# Patient Record
Sex: Female | Born: 1950 | Race: White | Hispanic: No | Marital: Single | Smoking: Never smoker
Health system: Southern US, Community
[De-identification: ages and names within clinical notes are randomized; demographics above are authoritative.]

## PROBLEM LIST (undated history)

## (undated) DIAGNOSIS — G912 (Idiopathic) normal pressure hydrocephalus: Secondary | ICD-10-CM

## (undated) DIAGNOSIS — E785 Hyperlipidemia, unspecified: Secondary | ICD-10-CM

## (undated) DIAGNOSIS — K5790 Diverticulosis of intestine, part unspecified, without perforation or abscess without bleeding: Secondary | ICD-10-CM

## (undated) DIAGNOSIS — C679 Malignant neoplasm of bladder, unspecified: Secondary | ICD-10-CM

## (undated) DIAGNOSIS — I69398 Other sequelae of cerebral infarction: Secondary | ICD-10-CM

## (undated) DIAGNOSIS — Z8739 Personal history of other diseases of the musculoskeletal system and connective tissue: Secondary | ICD-10-CM

## (undated) DIAGNOSIS — R209 Unspecified disturbances of skin sensation: Secondary | ICD-10-CM

## (undated) DIAGNOSIS — K649 Unspecified hemorrhoids: Secondary | ICD-10-CM

## (undated) DIAGNOSIS — I639 Cerebral infarction, unspecified: Secondary | ICD-10-CM

## (undated) DIAGNOSIS — S82892A Other fracture of left lower leg, initial encounter for closed fracture: Secondary | ICD-10-CM

## (undated) HISTORY — DX: Personal history of other diseases of the musculoskeletal system and connective tissue: Z87.39

## (undated) HISTORY — DX: (Idiopathic) normal pressure hydrocephalus: G91.2

## (undated) HISTORY — PX: APPENDECTOMY: SHX54

## (undated) HISTORY — PX: ABDOMINAL HYSTERECTOMY: SHX81

## (undated) HISTORY — PX: TONSILLECTOMY: SUR1361

## (undated) HISTORY — DX: Other sequelae of cerebral infarction: I69.398

## (undated) HISTORY — DX: Unspecified disturbances of skin sensation: R20.9

## (undated) HISTORY — PX: OTHER SURGICAL HISTORY: SHX169

## (undated) HISTORY — DX: Other fracture of left lower leg, initial encounter for closed fracture: S82.892A

## (undated) HISTORY — DX: Hyperlipidemia, unspecified: E78.5

## (undated) HISTORY — PX: REFRACTIVE SURGERY: SHX103

## (undated) HISTORY — PX: BRAIN SURGERY: SHX531

## (undated) HISTORY — PX: BLADDER SURGERY: SHX569

---

## 2009-04-14 DIAGNOSIS — C679 Malignant neoplasm of bladder, unspecified: Secondary | ICD-10-CM

## 2009-04-14 HISTORY — DX: Malignant neoplasm of bladder, unspecified: C67.9

## 2012-04-21 ENCOUNTER — Emergency Department (HOSPITAL_COMMUNITY)
Admission: EM | Admit: 2012-04-21 | Discharge: 2012-04-21 | Disposition: A | Discharge status: Home / Self Care | Payer: BC Managed Care – PPO | Attending: Emergency Medicine | Admitting: Emergency Medicine

## 2012-04-21 ENCOUNTER — Encounter (HOSPITAL_COMMUNITY): Payer: Self-pay | Admitting: Emergency Medicine

## 2012-04-21 DIAGNOSIS — Z8551 Personal history of malignant neoplasm of bladder: Secondary | ICD-10-CM | POA: Insufficient documentation

## 2012-04-21 DIAGNOSIS — Z90710 Acquired absence of both cervix and uterus: Secondary | ICD-10-CM | POA: Insufficient documentation

## 2012-04-21 DIAGNOSIS — R55 Syncope and collapse: Secondary | ICD-10-CM

## 2012-04-21 HISTORY — DX: Malignant neoplasm of bladder, unspecified: C67.9

## 2012-04-21 LAB — URINALYSIS, ROUTINE W REFLEX MICROSCOPIC
Bilirubin Urine: NEGATIVE
Glucose, UA: NEGATIVE mg/dL
Ketones, ur: NEGATIVE mg/dL
Specific Gravity, Urine: >1.03 — ABNORMAL HIGH (ref 1.005–1.030)
pH: 6 (ref 5.0–8.0)

## 2012-04-21 LAB — COMPREHENSIVE METABOLIC PANEL
ALT: 24 U/L (ref 0–35)
AST: 24 U/L (ref 0–37)
Albumin: 3.7 g/dL (ref 3.5–5.2)
Alkaline Phosphatase: 79 U/L (ref 39–117)
Calcium: 8.8 mg/dL (ref 8.4–10.5)
GFR calc Af Amer: 82 mL/min — ABNORMAL LOW (ref >90)
Glucose, Bld: 141 mg/dL — ABNORMAL HIGH (ref 70–99)
Potassium: 4 mEq/L (ref 3.5–5.1)
Sodium: 136 mEq/L (ref 135–145)
Total Protein: 6.2 g/dL (ref 6.0–8.3)

## 2012-04-21 LAB — POCT I-STAT TROPONIN I

## 2012-04-21 LAB — URINE MICROSCOPIC-ADD ON

## 2012-04-21 LAB — CBC WITH DIFFERENTIAL/PLATELET
Basophils Absolute: 0 10*3/uL (ref 0.0–0.1)
Eosinophils Absolute: 0 10*3/uL (ref 0.0–0.7)
Lymphs Abs: 1.5 10*3/uL (ref 0.7–4.0)
MCH: 29 pg (ref 26.0–34.0)
Neutrophils Relative %: 73 % (ref 43–77)
Platelets: 224 10*3/uL (ref 150–400)
RBC: 4.52 MIL/uL (ref 3.87–5.11)
RDW: 13.8 % (ref 11.5–15.5)
WBC: 7.8 10*3/uL (ref 4.0–10.5)

## 2012-04-21 NOTE — ED Notes (Signed)
Pt c/o witnessed loss of consciousness. Was at gym, got hot sat down and was led to floor. Witnesses state she was out for 3-5 min. Denies head trauma or pain.

## 2012-04-21 NOTE — ED Provider Notes (Signed)
History     CSN: [id number]  Arrival date & time 04/21/12  1101   First MD Initiated Contact with Patient 04/21/12 1113      No chief complaint on file.   (Consider location/radiation/quality/duration/timing/severity/associated sxs/prior treatment) HPI  Kylie Calhoun is a 62 y.o. female with past medical history significant for bladder cancer in full remission for the last 2 years complaining of syncope this morning while having a personal training session. Patient said that she began to feel very warm and weak. That was witnessed, no head trauma, jerking movements, loss of bowel or bladder control. Patient was unconscious for lasted for seconds and she woke with full mental faculties with no postictal state. Prior to and after the event patient denies  Chest pain, palpitations, shortness of breath, abdominal pain, nausea vomiting, change in bowel or bladder habits. Patient had similar prior syncopal episode over the summer when she was on her front lawn and overheated.  Past Medical History  Diagnosis Date  . Bladder cancer 2011    Past Surgical History  Procedure Date  . Other surgical history     BSG  . Tonsillectomy   . Abdominal hysterectomy   . Appendectomy   . Bladder surgery     scope and removal of tumors    History reviewed. No pertinent family history.  History  Substance Use Topics  . Smoking status: Not on file  . Smokeless tobacco: Not on file  . Alcohol Use: Not on file    OB History    No data available      Review of Systems  Constitutional: Negative for fever.  Respiratory: Negative for shortness of breath.   Cardiovascular: Negative for chest pain.  Gastrointestinal: Negative for nausea, vomiting, abdominal pain and diarrhea.  Neurological: Positive for syncope.  All other systems reviewed and are negative.    Allergies  Review of patient's allergies indicates not on file.  Home Medications  No current outpatient prescriptions on  file.  BP 88/60  Pulse 74  Temp 97.4 F (36.3 C) (Oral)  Resp 15  SpO2 99%  Physical Exam  Nursing note and vitals reviewed. Constitutional: She is oriented to person, place, and time. She appears well-developed and well-nourished. No distress.  HENT:  Head: Normocephalic.  Mouth/Throat: Oropharynx is clear and moist.       Ruddy palpebral conjunctiva  Eyes: Conjunctivae normal and EOM are normal. Pupils are equal, round, and reactive to light.  Neck: Normal range of motion.  Cardiovascular: Normal rate and intact distal pulses.   Pulmonary/Chest: Effort normal and breath sounds normal. No stridor. No respiratory distress. She has no wheezes. She has no rales. She exhibits no tenderness.  Abdominal: Soft. Bowel sounds are normal. She exhibits no distension and no mass. There is no tenderness. There is no rebound and no guarding.  Musculoskeletal: Normal range of motion.  Neurological: She is alert and oriented to person, place, and time.       Strength is 5 out of 5x4 extremities.  Patient ambulates without issue.  Skin: Skin is warm.  Psychiatric: She has a normal mood and affect.    ED Course  Procedures (including critical care time)  Labs Reviewed  COMPREHENSIVE METABOLIC PANEL - Abnormal; Notable for the following:    Glucose, Bld 141 (*)     GFR calc non Af Amer 70 (*)     GFR calc Af Amer 82 (*)     All other components within normal limits  URINALYSIS, ROUTINE W REFLEX MICROSCOPIC - Abnormal; Notable for the following:    APPearance CLOUDY (*)     Specific Gravity, Urine >1.030 (*)     Hgb urine dipstick SMALL (*)     All other components within normal limits  URINE MICROSCOPIC-ADD ON - Abnormal; Notable for the following:    Crystals CA OXALATE CRYSTALS (*)     All other components within normal limits  CBC WITH DIFFERENTIAL  POCT I-STAT TROPONIN I  POCT CBG (FASTING - GLUCOSE)-MANUAL ENTRY   No results found.   Date: 04/21/2012  Rate: 67  Rhythm:  normal sinus rhythm  QRS Axis: normal  Intervals: normal  ST/T Wave abnormalities: normal  Conduction Disutrbances:none  Narrative Interpretation:   Old EKG Reviewed: none available    1. Syncope       MDM  Patient with likely overexertion and heat exhaustion in the gym. She is borderline orthostatic by numbers however she is asymptomatic when she stands up. Patient and was without any difficulty she is asymptomatic for chest pain palpitations, shortness of breath at this time.  EKG, blood work, urinalysis are within normal limits.  This is a shared visit with attending who agrees with plan and stability to d/c to home.    Pt verbalized understanding and agrees with care plan. Outpatient follow-up and return precautions given.              Wynetta Emery, PA-C 04/21/12 1244

## 2012-04-21 NOTE — ED Provider Notes (Signed)
Medical screening examination/treatment/procedure(s) were conducted as a shared visit with non-physician practitioner(s) and myself.  I personally evaluated the patient during the encounter  Patient was evaluated by myself and is doing well. Her examination is entirely normal and she is back at her normal baseline. Patient likely got overheated while working out at the gym and had a vasovagal episode. She can followup with her primary doctor.  Gilda Crease, MD 04/21/12 [phone]

## 2012-06-21 ENCOUNTER — Other Ambulatory Visit (HOSPITAL_COMMUNITY): Payer: Self-pay | Admitting: Internal Medicine

## 2012-06-24 ENCOUNTER — Ambulatory Visit (HOSPITAL_COMMUNITY)
Admission: RE | Admit: 2012-06-24 | Discharge: 2012-06-24 | Disposition: A | Discharge status: Home / Self Care | Payer: BC Managed Care – PPO | Source: Ambulatory Visit | Attending: Internal Medicine | Admitting: Internal Medicine

## 2012-06-24 ENCOUNTER — Encounter (HOSPITAL_COMMUNITY): Payer: Self-pay

## 2012-06-24 DIAGNOSIS — M810 Age-related osteoporosis without current pathological fracture: Secondary | ICD-10-CM | POA: Insufficient documentation

## 2012-06-24 MED ORDER — ZOLEDRONIC ACID 5 MG/100ML IV SOLN
5.0000 mg | Freq: Once | INTRAVENOUS | Status: AC
  Administered 2012-06-24: 5 mg via INTRAVENOUS
  Filled 2012-06-24: qty 100

## 2012-06-24 MED ORDER — SODIUM CHLORIDE 0.9 % IV SOLN
INTRAVENOUS | Status: DC
  Administered 2012-06-24: 10:00:00 via INTRAVENOUS

## 2014-02-13 ENCOUNTER — Encounter (HOSPITAL_COMMUNITY): Payer: Self-pay

## 2014-03-29 ENCOUNTER — Encounter: Payer: Self-pay | Admitting: Gastroenterology

## 2014-05-09 ENCOUNTER — Ambulatory Visit: Payer: Medicare Other

## 2014-05-09 VITALS — Ht 63.0 in | Wt 132.8 lb

## 2014-05-09 DIAGNOSIS — Z1211 Encounter for screening for malignant neoplasm of colon: Secondary | ICD-10-CM

## 2014-05-09 MED ORDER — MOVIPREP 100 G PO SOLR: ORAL | Status: DC

## 2014-05-09 NOTE — Progress Notes (Signed)
Per pt, no allergies to soy or egg products.Pt not taking any weight loss meds or using  O2 at home.   Pt states she had a colonoscopy done in [place] over 15 years ago, but does not remember the doctor's name. She states the colonoscopy was normal.

## 2014-05-19 ENCOUNTER — Encounter: Payer: Self-pay | Admitting: Gastroenterology

## 2014-05-19 ENCOUNTER — Ambulatory Visit (AMBULATORY_SURGERY_CENTER): Payer: Medicare Other | Admitting: Gastroenterology

## 2014-05-19 VITALS — BP 126/83 | HR 58 | Temp 97.4°F | Resp 12 | Ht 63.0 in | Wt 132.0 lb

## 2014-05-19 DIAGNOSIS — Z1211 Encounter for screening for malignant neoplasm of colon: Secondary | ICD-10-CM

## 2014-05-19 MED ORDER — SODIUM CHLORIDE 0.9 % IV SOLN: 500.0000 mL | INTRAVENOUS | Status: DC

## 2014-05-19 NOTE — Patient Instructions (Signed)
YOU HAD AN ENDOSCOPIC PROCEDURE TODAY AT THE [facility]: Refer to the procedure report that was given to you for any specific questions about what was found during the examination.  If the procedure report does not answer your questions, please call your gastroenterologist to clarify.  If you requested that your care partner not be given the details of your procedure findings, then the procedure report has been included in a sealed envelope for you to review at your convenience later.  YOU SHOULD EXPECT: Some feelings of bloating in the abdomen. Passage of more gas than usual.  Walking can help get rid of the air that was put into your GI tract during the procedure and reduce the bloating. If you had a lower endoscopy (such as a colonoscopy or flexible sigmoidoscopy) you may notice spotting of blood in your stool or on the toilet paper. If you underwent a bowel prep for your procedure, then you may not have a normal bowel movement for a few days.  DIET: Your first meal following the procedure should be a light meal and then it is ok to progress to your normal diet.  A half-sandwich or bowl of soup is an example of a good first meal.  Heavy or fried foods are harder to digest and may make you feel nauseous or bloated.  Likewise meals heavy in dairy and vegetables can cause extra gas to form and this can also increase the bloating.  Drink plenty of fluids but you should avoid alcoholic beverages for 24 hours.  ACTIVITY: Your care partner should take you home directly after the procedure.  You should plan to take it easy, moving slowly for the rest of the day.  You can resume normal activity the day after the procedure however you should NOT DRIVE or use heavy machinery for 24 hours (because of the sedation medicines used during the test).    SYMPTOMS TO REPORT IMMEDIATELY: A gastroenterologist can be reached at any hour.  During normal business hours, 8:30 AM to 5:00 PM Monday through Friday,  call [phone].  After hours and on weekends, please call the GI answering service at [phone] who will take a message and have the physician on call contact you.   Following lower endoscopy (colonoscopy or flexible sigmoidoscopy):  Excessive amounts of blood in the stool  Significant tenderness or worsening of abdominal pains  Swelling of the abdomen that is new, acute  Fever of 100F or higher FOLLOW UP: If any biopsies were taken you will be contacted by phone or by letter within the next 1-3 weeks.  Call your gastroenterologist if you have not heard about the biopsies in 3 weeks.  Our staff will call the home number listed on your records the next business day following your procedure to check on you and address any questions or concerns that you may have at that time regarding the information given to you following your procedure. This is a courtesy call and so if there is no answer at the home number and we have not heard from you through the emergency physician on call, we will assume that you have returned to your regular daily activities without incident.  SIGNATURES/CONFIDENTIALITY: You and/or your care partner have signed paperwork which will be entered into your electronic medical record.  These signatures attest to the fact that that the information above on your After Visit Summary has been reviewed and is understood.  Full responsibility of the confidentiality of this discharge   information lies with you and/or your care-partner.     Handouts were given to your care partner on hemorrhoids, diverticulosis,and a high fiber diet with liberal fluid intake. You might notice some irritation in your nose or drainage.  This may cause feelings of congestion.  This is from the oxygen, which can be drying.  This is no cause for concern; this should clear up in a few days.  You may resume your current medications today. Please call if any questions or concerns.   

## 2014-05-19 NOTE — Progress Notes (Signed)
No problems noted in the recovery room. maw 

## 2014-05-19 NOTE — Progress Notes (Signed)
Stable to RR 

## 2014-05-19 NOTE — Op Note (Signed)
[facility]  [address] [place], [id number]   COLONOSCOPY PROCEDURE REPORT  PATIENT: Kylie Calhoun, Kylie Calhoun  MR#: [id number] BIRTHDATE: 07-30-1950 , 63  yrs. old GENDER: female ENDOSCOPIST: Ladene Artist, MD, [facility] REFERRED BY:W.  Lutricia Feil, M.D. PROCEDURE DATE:  05/19/2014 PROCEDURE:   Colonoscopy, screening First Screening Colonoscopy - Avg.  risk and is 50 yrs.  old or older - No.  Prior Negative Screening - Now for repeat screening. 10 or more years since last screening  History of Adenoma - Now for follow-up colonoscopy & has been > or = to 3 yrs.  N/A  Polyps Removed Today? No.  Polyps Removed Today? No.  Recommend repeat exam, <10 yrs? Polyps Removed Today? No.  Recommend repeat exam, <10 yrs? No. ASA CLASS:   Class II INDICATIONS:average risk for colorectal cancer. MEDICATIONS: Monitored anesthesia care, Propofol 250 mg IV, and lidocaine 40 mg IV DESCRIPTION OF PROCEDURE:   After the risks benefits and alternatives of the procedure were thoroughly explained, informed consent was obtained.  The digital rectal exam revealed no abnormalities of the rectum.   The LB [id number]  endoscope was introduced through the anus and advanced to the cecum, which was identified by both the appendix and ileocecal valve. No adverse events experienced.   The quality of the prep was excellent, using MoviPrep  The instrument was then slowly withdrawn as the colon was fully examined.    COLON FINDINGS: There was mild diverticulosis noted in the sigmoid colon.   The examination was otherwise normal.  Retroflexed views revealed internal Grade I hemorrhoids. The time to cecum=2 minutes 29 seconds.  Withdrawal time=10 minutes 24 seconds.  The scope was withdrawn and the procedure completed. COMPLICATIONS: There were no immediate complications.  ENDOSCOPIC IMPRESSION: 1.   Mild diverticulosis in the sigmoid colon 2.   Grade l internal hemorrhoids  RECOMMENDATIONS: 1.   High fiber diet with liberal fluid intake. 2.  Continue current colorectal screening recommendations for "routine risk" patients with a repeat colonoscopy in 10 years.  eSigned:  Ladene Artist, MD, [facility] 05/19/2014 9:56 AM

## 2014-05-22 ENCOUNTER — Telehealth: Payer: Self-pay | Admitting: *Deleted

## 2014-05-22 NOTE — Telephone Encounter (Signed)
Message left

## 2015-03-19 ENCOUNTER — Encounter (HOSPITAL_COMMUNITY): Payer: Self-pay | Admitting: Emergency Medicine

## 2015-03-19 ENCOUNTER — Observation Stay (HOSPITAL_COMMUNITY): Payer: Medicare Other

## 2015-03-19 ENCOUNTER — Emergency Department (HOSPITAL_COMMUNITY): Payer: Medicare Other

## 2015-03-19 ENCOUNTER — Inpatient Hospital Stay (HOSPITAL_COMMUNITY)
Admission: EM | Admit: 2015-03-19 | Discharge: 2015-03-21 | DRG: 066 | Disposition: A | Discharge status: Home / Self Care | Payer: Medicare Other | Attending: Internal Medicine | Admitting: Internal Medicine

## 2015-03-19 DIAGNOSIS — R269 Unspecified abnormalities of gait and mobility: Secondary | ICD-10-CM | POA: Diagnosis present

## 2015-03-19 DIAGNOSIS — I639 Cerebral infarction, unspecified: Secondary | ICD-10-CM

## 2015-03-19 DIAGNOSIS — R200 Anesthesia of skin: Secondary | ICD-10-CM | POA: Diagnosis present

## 2015-03-19 DIAGNOSIS — G459 Transient cerebral ischemic attack, unspecified: Secondary | ICD-10-CM | POA: Diagnosis present

## 2015-03-19 DIAGNOSIS — R2681 Unsteadiness on feet: Secondary | ICD-10-CM | POA: Diagnosis not present

## 2015-03-19 DIAGNOSIS — R262 Difficulty in walking, not elsewhere classified: Secondary | ICD-10-CM | POA: Diagnosis present

## 2015-03-19 DIAGNOSIS — I6523 Occlusion and stenosis of bilateral carotid arteries: Secondary | ICD-10-CM | POA: Diagnosis present

## 2015-03-19 DIAGNOSIS — Z8551 Personal history of malignant neoplasm of bladder: Secondary | ICD-10-CM

## 2015-03-19 DIAGNOSIS — Z8739 Personal history of other diseases of the musculoskeletal system and connective tissue: Secondary | ICD-10-CM

## 2015-03-19 DIAGNOSIS — G450 Vertebro-basilar artery syndrome: Secondary | ICD-10-CM | POA: Diagnosis not present

## 2015-03-19 DIAGNOSIS — I739 Peripheral vascular disease, unspecified: Secondary | ICD-10-CM | POA: Diagnosis present

## 2015-03-19 DIAGNOSIS — K649 Unspecified hemorrhoids: Secondary | ICD-10-CM | POA: Insufficient documentation

## 2015-03-19 DIAGNOSIS — M8580 Other specified disorders of bone density and structure, unspecified site: Secondary | ICD-10-CM | POA: Diagnosis present

## 2015-03-19 DIAGNOSIS — K5790 Diverticulosis of intestine, part unspecified, without perforation or abscess without bleeding: Secondary | ICD-10-CM | POA: Insufficient documentation

## 2015-03-19 DIAGNOSIS — E785 Hyperlipidemia, unspecified: Secondary | ICD-10-CM | POA: Insufficient documentation

## 2015-03-19 DIAGNOSIS — C679 Malignant neoplasm of bladder, unspecified: Secondary | ICD-10-CM | POA: Diagnosis present

## 2015-03-19 DIAGNOSIS — R29700 NIHSS score 0: Secondary | ICD-10-CM | POA: Diagnosis present

## 2015-03-19 HISTORY — DX: Diverticulosis of intestine, part unspecified, without perforation or abscess without bleeding: K57.90

## 2015-03-19 HISTORY — DX: Unspecified hemorrhoids: K64.9

## 2015-03-19 LAB — COMPREHENSIVE METABOLIC PANEL
ALBUMIN: 4.1 g/dL (ref 3.5–5.0)
ALK PHOS: 61 U/L (ref 38–126)
ALT: 21 U/L (ref 14–54)
ANION GAP: 10 (ref 5–15)
AST: 24 U/L (ref 15–41)
BUN: 13 mg/dL (ref 6–20)
CO2: 22 mmol/L (ref 22–32)
CREATININE: 1.03 mg/dL — AB (ref 0.44–1.00)
Calcium: 9 mg/dL (ref 8.9–10.3)
Chloride: 108 mmol/L (ref 101–111)
GFR calc non Af Amer: 55 mL/min — ABNORMAL LOW (ref >60)
Glucose, Bld: 111 mg/dL — ABNORMAL HIGH (ref 65–99)
POTASSIUM: 3.7 mmol/L (ref 3.5–5.1)
SODIUM: 140 mmol/L (ref 135–145)
TOTAL PROTEIN: 6.4 g/dL — AB (ref 6.5–8.1)
Total Bilirubin: 1.3 mg/dL — ABNORMAL HIGH (ref 0.3–1.2)

## 2015-03-19 LAB — CBC
HCT: 44.8 % (ref 36.0–46.0)
HEMOGLOBIN: 14.6 g/dL (ref 12.0–15.0)
MCH: 29.3 pg (ref 26.0–34.0)
MCHC: 32.6 g/dL (ref 30.0–36.0)
MCV: 90 fL (ref 78.0–100.0)
PLATELETS: 252 10*3/uL (ref 150–400)
RBC: 4.98 MIL/uL (ref 3.87–5.11)
RDW: 13.6 % (ref 11.5–15.5)
WBC: 7.1 10*3/uL (ref 4.0–10.5)

## 2015-03-19 LAB — PROTIME-INR
INR: 1 (ref 0.00–1.49)
Prothrombin Time: 13.4 seconds (ref 11.6–15.2)

## 2015-03-19 LAB — ETHANOL: Alcohol, Ethyl (B): <5 mg/dL (ref <5)

## 2015-03-19 LAB — I-STAT CHEM 8, ED
BUN: 13 mg/dL (ref 6–20)
CREATININE: 1 mg/dL (ref 0.44–1.00)
Calcium, Ion: 1.09 mmol/L — ABNORMAL LOW (ref 1.13–1.30)
Chloride: 107 mmol/L (ref 101–111)
Glucose, Bld: 106 mg/dL — ABNORMAL HIGH (ref 65–99)
HEMATOCRIT: 48 % — AB (ref 36.0–46.0)
HEMOGLOBIN: 16.3 g/dL — AB (ref 12.0–15.0)
Potassium: 3.6 mmol/L (ref 3.5–5.1)
SODIUM: 143 mmol/L (ref 135–145)
TCO2: 24 mmol/L (ref 0–100)

## 2015-03-19 LAB — URINALYSIS, ROUTINE W REFLEX MICROSCOPIC
Bilirubin Urine: NEGATIVE
Glucose, UA: NEGATIVE mg/dL
KETONES UR: NEGATIVE mg/dL
Nitrite: NEGATIVE
PROTEIN: NEGATIVE mg/dL
Specific Gravity, Urine: 1.006 (ref 1.005–1.030)
pH: 6.5 (ref 5.0–8.0)

## 2015-03-19 LAB — RAPID URINE DRUG SCREEN, HOSP PERFORMED
Amphetamines: NOT DETECTED
BARBITURATES: NOT DETECTED
BENZODIAZEPINES: NOT DETECTED
Cocaine: NOT DETECTED
Opiates: NOT DETECTED
TETRAHYDROCANNABINOL: NOT DETECTED

## 2015-03-19 LAB — DIFFERENTIAL
BASOS PCT: 0 %
Basophils Absolute: 0 10*3/uL (ref 0.0–0.1)
EOS ABS: 0.1 10*3/uL (ref 0.0–0.7)
EOS PCT: 2 %
Lymphocytes Relative: 48 %
Lymphs Abs: 3.5 10*3/uL (ref 0.7–4.0)
MONO ABS: 0.6 10*3/uL (ref 0.1–1.0)
Monocytes Relative: 9 %
NEUTROS PCT: 41 %
Neutro Abs: 2.9 10*3/uL (ref 1.7–7.7)

## 2015-03-19 LAB — URINE MICROSCOPIC-ADD ON

## 2015-03-19 LAB — APTT: aPTT: 28 seconds (ref 24–37)

## 2015-03-19 LAB — I-STAT TROPONIN, ED: TROPONIN I, POC: 0.01 ng/mL (ref 0.00–0.08)

## 2015-03-19 MED ORDER — CALCIUM 75 MG PO TABS: ORAL_TABLET | Freq: Every day | ORAL | Status: DC

## 2015-03-19 MED ORDER — SENNOSIDES-DOCUSATE SODIUM 8.6-50 MG PO TABS: 1.0000 | ORAL_TABLET | Freq: Every evening | ORAL | Status: DC | PRN

## 2015-03-19 MED ORDER — ASPIRIN EC 81 MG PO TBEC
81.0000 mg | DELAYED_RELEASE_TABLET | Freq: Every day | ORAL | Status: DC
  Administered 2015-03-19 – 2015-03-20 (×2): 81 mg via ORAL
  Filled 2015-03-19 (×2): qty 1

## 2015-03-19 MED ORDER — ATORVASTATIN CALCIUM 10 MG PO TABS
10.0000 mg | ORAL_TABLET | Freq: Every day | ORAL | Status: DC
  Administered 2015-03-19 – 2015-03-20 (×2): 10 mg via ORAL
  Filled 2015-03-19 (×2): qty 1

## 2015-03-19 MED ORDER — ENOXAPARIN SODIUM 30 MG/0.3ML ~~LOC~~ SOLN
30.0000 mg | SUBCUTANEOUS | Status: DC
  Administered 2015-03-19 – 2015-03-21 (×3): 30 mg via SUBCUTANEOUS
  Filled 2015-03-19 (×3): qty 0.3

## 2015-03-19 MED ORDER — CALCIUM CARBONATE 1250 (500 CA) MG PO TABS
1.0000 | ORAL_TABLET | Freq: Every day | ORAL | Status: DC
  Administered 2015-03-20 – 2015-03-21 (×2): 500 mg via ORAL
  Filled 2015-03-19 (×2): qty 1

## 2015-03-19 MED ORDER — STROKE: EARLY STAGES OF RECOVERY BOOK
Freq: Once | Status: AC
  Administered 2015-03-19: 11:00:00
  Filled 2015-03-19: qty 1

## 2015-03-19 NOTE — ED Notes (Signed)
ORDERED BREAKFAST FOR PT

## 2015-03-19 NOTE — Plan of Care (Signed)
Was called by Dr. Betsey Holiday with regards to Kylie Calhoun, 64 year old female who presented with numbness of the right upper and lower extremity and right face. Patient had similar symptoms 2 days ago. CT of the head was unremarkable. Neurology will be seeing patient in consult. Patient has been admitted for possible TIA versus CVA.  Kylie Calhoun

## 2015-03-19 NOTE — ED Notes (Signed)
Dr.Camilio at bedside

## 2015-03-19 NOTE — Consult Note (Signed)
Referring Physician: Dr Betsey Holiday, ED    Chief Complaint: right sided numbness  HPI:                                                                                                                                         Kylie Calhoun is an 64 y.o. female, right handed,with a past medical history pertinent for bladber cancer, presents to the ED with complains of new onset numbness right face-arm-leg. She said that she went to bed around 10 pm last night feeling well but woke up at 3 am today with a numb sensation in her right arm that subsequently travelled to the right leg and then the right face and is still ongoing. Said that at some point she was dragging the right leg but it resolved. Importantly, she indicated that on Saturday night she had transient numbness of the right arm but she did not seek medical help. Denies associated HA, vertigo, double vision, difficulty swallowing, arm weakness, confusion, language or vision impairment. CT brain was personally reviewed and showed no acute abnormality. Serologies including etoh level are unrevealing.  Date last known well: 03/18/15 Time last known well: 10 pm tPA Given: no, out of the window NIHSS: 0  MRS: 0  Past Medical History  Diagnosis Date  . Bladder cancer ([place]) 2011    3 times  . Hx of osteopenia     Past Surgical History  Procedure Laterality Date  . 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 Tonsillectomy    . Abdominal hysterectomy    . Appendectomy    . Bladder surgery      scope and removal of tumors    Family History  Problem Relation Age of Onset  . Heart disease Father   . Stroke Sister   . Hypertension Sister   . Stroke Brother   . Hypertension Brother    Social History:  reports that she has never smoked. She has never used smokeless tobacco. She reports that she drinks about 0.6 oz of alcohol per week. She reports that she does not use illicit drugs. Family history: no epilepsy, MS, or  brin tumor Allergies: No Known Allergies  Medications:  I have reviewed the patient's current medications.  ROS:                                                                                                                                       History obtained from chart review and the patient  General ROS: negative for - chills, fatigue, fever, night sweats, weight gain or weight loss Psychological ROS: negative for - behavioral disorder, hallucinations, memory difficulties, mood swings or suicidal ideation Ophthalmic ROS: negative for - blurry vision, double vision, eye pain or loss of vision ENT ROS: negative for - epistaxis, nasal discharge, oral lesions, sore throat, tinnitus or vertigo Allergy and Immunology ROS: negative for - hives or itchy/watery eyes Hematological and Lymphatic ROS: negative for - bleeding problems, bruising or swollen lymph nodes Endocrine ROS: negative for - galactorrhea, hair pattern changes, polydipsia/polyuria or temperature intolerance Respiratory ROS: negative for - cough, hemoptysis, shortness of breath or wheezing Cardiovascular ROS: negative for - chest pain, dyspnea on exertion, edema or irregular heartbeat Gastrointestinal ROS: negative for - abdominal pain, diarrhea, hematemesis, nausea/vomiting or stool incontinence Genito-Urinary ROS: negative for - dysuria, hematuria, incontinence or urinary frequency/urgency Musculoskeletal ROS: negative for - joint swelling or muscular weakness Neurological ROS: as noted in HPI Dermatological ROS: negative for rash and skin lesion changes  Physical exam:  Constitutional: well developed, pleasant female in no apparent distress. Blood pressure 149/90, pulse 66, temperature 98 F (36.7 C), temperature source Oral, resp. rate 18, SpO2 96 %. Eyes: no jaundice or exophthalmos.  Head:  normocephalic. Neck: supple, no bruits, no JVD. Cardiac: no murmurs. Lungs: clear. Abdomen: soft, no tender, no mass. Extremities: no edema, clubbing, or cyanosis.  Skin: no rash  Neurologic Examination:                                                                                                      General: NAD Mental Status: Alert, oriented, thought content appropriate.  Speech fluent without evidence of aphasia.  Able to follow 3 step commands without difficulty. Cranial Nerves: II: Discs flat bilaterally; Visual fields grossly normal, pupils equal, round, reactive to light and accommodation III,IV, VI: ptosis not present, extra-ocular motions intact bilaterally V,VII: smile symmetric, facial light touch sensation normal bilaterally VIII: hearing normal bilaterally IX,X: uvula rises symmetrically XI: bilateral shoulder shrug XII: midline tongue extension without atrophy or fasciculations  Motor: Right : Upper extremity   5/5    Left:     Upper extremity   5/5  Lower extremity   5/5     Lower extremity   5/5 Tone and bulk:normal tone throughout; no atrophy noted Sensory: Pinprick and light touch intact throughout, bilaterally Deep Tendon Reflexes:  Right: Upper Extremity   Left: Upper extremity   biceps (C-5 to C-6) 2/4   biceps (C-5 to C-6) 2/4 tricep (C7) 2/4    triceps (C7) 2/4 Brachioradialis (C6) 2/4  Brachioradialis (C6) 2/4  Lower Extremity Lower Extremity  quadriceps (L-2 to L-4) 2/4   quadriceps (L-2 to L-4) 2/4 Achilles (S1) 2/4   Achilles (S1) 2/4  Plantars: Right: downgoing   Left: downgoing Cerebellar: normal finger-to-nose,  normal heel-to-shin test Gait:  No tested due to multiple leads    Results for orders placed or performed during the hospital encounter of 03/19/15 (from the past 48 hour(s))  Ethanol     Status: None   Collection Time: 03/19/15  4:50 AM  Result Value Ref Range   Alcohol, Ethyl (B) <5 <5 mg/dL    Comment:        LOWEST  DETECTABLE LIMIT FOR SERUM ALCOHOL IS 5 mg/dL FOR MEDICAL PURPOSES ONLY   Protime-INR     Status: None   Collection Time: 03/19/15  4:50 AM  Result Value Ref Range   Prothrombin Time 13.4 11.6 - 15.2 seconds   INR 1.00 0.00 - 1.49  APTT     Status: None   Collection Time: 03/19/15  4:50 AM  Result Value Ref Range   aPTT 28 24 - 37 seconds  CBC     Status: None   Collection Time: 03/19/15  4:50 AM  Result Value Ref Range   WBC 7.1 4.0 - 10.5 K/uL   RBC 4.98 3.87 - 5.11 MIL/uL   Hemoglobin 14.6 12.0 - 15.0 g/dL   HCT 44.8 36.0 - 46.0 %   MCV 90.0 78.0 - 100.0 fL   MCH 29.3 26.0 - 34.0 pg   MCHC 32.6 30.0 - 36.0 g/dL   RDW 13.6 11.5 - 15.5 %   Platelets 252 150 - 400 K/uL  Differential     Status: None   Collection Time: 03/19/15  4:50 AM  Result Value Ref Range   Neutrophils Relative % 41 %   Neutro Abs 2.9 1.7 - 7.7 K/uL   Lymphocytes Relative 48 %   Lymphs Abs 3.5 0.7 - 4.0 K/uL   Monocytes Relative 9 %   Monocytes Absolute 0.6 0.1 - 1.0 K/uL   Eosinophils Relative 2 %   Eosinophils Absolute 0.1 0.0 - 0.7 K/uL   Basophils Relative 0 %   Basophils Absolute 0.0 0.0 - 0.1 K/uL  Comprehensive metabolic panel     Status: Abnormal   Collection Time: 03/19/15  4:50 AM  Result Value Ref Range   Sodium 140 135 - 145 mmol/L   Potassium 3.7 3.5 - 5.1 mmol/L   Chloride 108 101 - 111 mmol/L   CO2 22 22 - 32 mmol/L   Glucose, Bld 111 (H) 65 - 99 mg/dL   BUN 13 6 - 20 mg/dL   Creatinine, Ser 1.03 (H) 0.44 - 1.00 mg/dL   Calcium 9.0 8.9 - 10.3 mg/dL   Total Protein 6.4 (L) 6.5 - 8.1 g/dL   Albumin 4.1 3.5 - 5.0 g/dL   AST 24 15 - 41 U/L   ALT 21 14 - 54 U/L   Alkaline Phosphatase 61 38 - 126 U/L   Total Bilirubin 1.3 (H) 0.3 - 1.2 mg/dL   GFR calc  non Af Amer 55 (L) >60 mL/min   GFR calc Af Amer >60 >60 mL/min    Comment: (NOTE) The eGFR has been calculated using the CKD EPI equation. This calculation has not been validated in all clinical situations. eGFR's persistently  <60 mL/min signify possible Chronic Kidney Disease.    Anion gap 10 5 - 15  I-stat troponin, ED (not at [facility], [facility])     Status: None   Collection Time: 03/19/15  5:07 AM  Result Value Ref Range   Troponin i, poc 0.01 0.00 - 0.08 ng/mL   Comment 3            Comment: Due to the release kinetics of cTnI, a negative result within the first hours of the onset of symptoms does not rule out myocardial infarction with certainty. If myocardial infarction is still suspected, repeat the test at appropriate intervals.   I-Stat Chem 8, ED  (not at [facility], [facility])     Status: Abnormal   Collection Time: 03/19/15  5:09 AM  Result Value Ref Range   Sodium 143 135 - 145 mmol/L   Potassium 3.6 3.5 - 5.1 mmol/L   Chloride 107 101 - 111 mmol/L   BUN 13 6 - 20 mg/dL   Creatinine, Ser 1.00 0.44 - 1.00 mg/dL   Glucose, Bld 106 (H) 65 - 99 mg/dL   Calcium, Ion 1.09 (L) 1.13 - 1.30 mmol/L   TCO2 24 0 - 100 mmol/L   Hemoglobin 16.3 (H) 12.0 - 15.0 g/dL   HCT 48.0 (H) 36.0 - 46.0 %  Urinalysis, Routine w reflex microscopic (not at [facility])     Status: Abnormal   Collection Time: 03/19/15  5:32 AM  Result Value Ref Range   Color, Urine YELLOW YELLOW   APPearance CLOUDY (A) CLEAR   Specific Gravity, Urine 1.006 1.005 - 1.030   pH 6.5 5.0 - 8.0   Glucose, UA NEGATIVE NEGATIVE mg/dL   Hgb urine dipstick TRACE (A) NEGATIVE   Bilirubin Urine NEGATIVE NEGATIVE   Ketones, ur NEGATIVE NEGATIVE mg/dL   Protein, ur NEGATIVE NEGATIVE mg/dL   Nitrite NEGATIVE NEGATIVE   Leukocytes, UA TRACE (A) NEGATIVE  Urine microscopic-add on     Status: Abnormal   Collection Time: 03/19/15  5:32 AM  Result Value Ref Range   Squamous Epithelial / LPF 0-5 (A) NONE SEEN   WBC, UA 0-5 0 - 5 WBC/hpf   RBC / HPF 0-5 0 - 5 RBC/hpf   Bacteria, UA RARE (A) NONE SEEN   Ct Head Wo Contrast  03/19/2015  CLINICAL DATA:  64 year old female with right-sided numbness EXAM: CT HEAD WITHOUT CONTRAST TECHNIQUE: Contiguous axial images were  obtained from the base of the skull through the vertex without intravenous contrast. COMPARISON:  None. FINDINGS: There is dilatation of the ventricles out of proportion with the sulci which may represent central volume loss versus normal pressure hydrocephalus. Clinical correlation is recommended. Periventricular and deep white matter hypodensities represent chronic microvascular ischemic changes. There is no intracranial hemorrhage. No mass effect or midline shift identified. The visualized paranasal sinuses and mastoid air cells are well aerated. The calvarium is intact. IMPRESSION: No acute intracranial pathology. Age-related atrophy and chronic microvascular ischemic disease. 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 Clinical correlation is recommended. If symptoms persist and there are no contraindications, MRI may provide better evaluation if clinically indicated. Electronically Signed   By: Anner Crete M.D.   On: 03/19/2015 05:11    Assessment: 64 y.o. female comes in  with acute onset right sided numbness. CT brain without acute abnormality. Neuroexam is non focal. Suspect left thalamic lacunar infarct versus recurrent TIA (had transient right arm numbness >24 hours ago). Out of the window for iv tPA and no demonstrable deficits on exam. Admit to medicine and complete TIA/stroke work up.  Stroke Risk Factors - age, TIA >24 hours ago.  Plan: 1. HgbA1c, fasting lipid panel 2. MRI, MRA  of the brain without contrast 3. Echocardiogram 4. Carotid dopplers 5. Prophylactic therapy-aspirin 6. Risk factor modification 7. Telemetry monitoring 8. Frequent neuro checks 9. PT/OT SLP 10. NPO  Dorian Pod, MD [facility] [phone]  03/19/2015, 6:18 AM

## 2015-03-19 NOTE — ED Notes (Signed)
Pt in MRI.

## 2015-03-19 NOTE — ED Provider Notes (Addendum)
CSN: [id number]     Arrival date & time 03/19/15  0440 History   First MD Initiated Contact with Patient 03/19/15 [phone]     Chief Complaint  Patient presents with  . Numbness     (Consider location/radiation/quality/duration/timing/severity/associated sxs/prior Treatment) HPI Comments: Patient presents to the emergency department for evaluation of numbness on the right side. Patient reports that she awakened this morning and noted that she was experiencing numbness on the right side of her face, her right arm and her right leg. She has not noticed any significant weakness, however. Patient awakened from sleep with the symptoms, went to bed around 10 PM last night and was feeling normal at that time.  She does, however, report that she had a similar episode 2 nights ago. She had onset of right-sided numbness that eventually resolved on its own. She did not seek care at that time.   Past Medical History  Diagnosis Date  . Bladder cancer ([place]) 2011    3 times  . Hx of osteopenia    Past Surgical History  Procedure Laterality Date  . 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 Tonsillectomy    . Abdominal hysterectomy    . Appendectomy    . Bladder surgery      scope and removal of tumors   Family History  Problem Relation Age of Onset  . Heart disease Father   . Stroke Sister   . Hypertension Sister   . Stroke Brother   . Hypertension Brother    Social History  Substance Use Topics  . Smoking status: Never Smoker   . Smokeless tobacco: Never Used  . Alcohol Use: 0.6 oz/week    1 Glasses of wine per week     Comment: Occasional    OB History    Gravida Para Term Preterm AB TAB SAB Ectopic Multiple Living   3              Review of Systems  Neurological: Positive for numbness.  All other systems reviewed and are negative.     Allergies  Review of patient's allergies indicates no known allergies.  Home Medications   Prior to Admission medications    Medication Sig Start Date End Date Taking? Authorizing Provider  CALCIUM PO Take 2 tablets by mouth daily.   Yes Historical Provider, MD  cetirizine (ZYRTEC) 10 MG tablet Take 5 mg by mouth daily.   Yes Historical Provider, MD  cholecalciferol (VITAMIN D) 1000 UNITS tablet Take 2,000 Units by mouth daily.   Yes Historical Provider, MD   BP 160/85 mmHg  Pulse 74  Temp(Src) 98 F (36.7 C) (Oral)  Resp 19  SpO2 97% Physical Exam  Constitutional: She is oriented to person, place, and time. She appears well-developed and well-nourished. No distress.  HENT:  Head: Normocephalic and atraumatic.  Right Ear: Hearing normal.  Left Ear: Hearing normal.  Nose: Nose normal.  Mouth/Throat: Oropharynx is clear and moist and mucous membranes are normal.  Eyes: Conjunctivae and EOM are normal. Pupils are equal, round, and reactive to light.  Neck: Normal range of motion. Neck supple.  Cardiovascular: Regular rhythm, S1 normal and S2 normal.  Exam reveals no gallop and no friction rub.   No murmur heard. Pulmonary/Chest: Effort normal and breath sounds normal. No respiratory distress. She exhibits no tenderness.  Abdominal: Soft. Normal appearance and bowel sounds are normal. There is no hepatosplenomegaly. There is no tenderness. There is  no rebound, no guarding, no tenderness at McBurney's point and negative Murphy's sign. No hernia.  Musculoskeletal: Normal range of motion.  Neurological: She is alert and oriented to person, place, and time. She has normal strength. No cranial nerve deficit or sensory deficit. Coordination normal. GCS eye subscore is 4. GCS verbal subscore is 5. GCS motor subscore is 6.  Extraocular muscle movement: normal No visual field cut Pupils: equal and reactive both direct and consensual response is normal No nystagmus present    Sensory function is intact to light touch, pinprick Proprioception intact  Grip strength 5/5 symmetric in upper extremities Possible slight  pronator drift right upper extremity  Normal finger to nose bilaterally  Lower extremity strength 5/5 against gravity Normal heel to shin bilaterally     Skin: Skin is warm, dry and intact. No rash noted. No cyanosis.  Psychiatric: She has a normal mood and affect. Her speech is normal and behavior is normal. Thought content normal.  Nursing note and vitals reviewed.   ED Course  Procedures (including critical care time) Labs Review Labs Reviewed  COMPREHENSIVE METABOLIC PANEL - Abnormal; Notable for the following:    Glucose, Bld 111 (*)    Creatinine, Ser 1.03 (*)    Total Protein 6.4 (*)    Total Bilirubin 1.3 (*)    GFR calc non Af Amer 55 (*)    All other components within normal limits  I-STAT CHEM 8, ED - Abnormal; Notable for the following:    Glucose, Bld 106 (*)    Calcium, Ion 1.09 (*)    Hemoglobin 16.3 (*)    HCT 48.0 (*)    All other components within normal limits  ETHANOL  PROTIME-INR  APTT  CBC  DIFFERENTIAL  URINE RAPID DRUG SCREEN, HOSP PERFORMED  URINALYSIS, ROUTINE W REFLEX MICROSCOPIC (NOT AT [facility])  I-STAT TROPOININ, ED    Imaging Review Ct Head Wo Contrast  03/19/2015  CLINICAL DATA:  64 year old female with right-sided numbness EXAM: CT HEAD WITHOUT CONTRAST TECHNIQUE: Contiguous axial images were obtained from the base of the skull through the vertex without intravenous contrast. COMPARISON:  None. FINDINGS: There is dilatation of the ventricles out of proportion with the sulci which may represent central volume loss versus normal pressure hydrocephalus. Clinical correlation is recommended. Periventricular and deep white matter hypodensities represent chronic microvascular ischemic changes. There is no intracranial hemorrhage. No mass effect or midline shift identified. The visualized paranasal sinuses and mastoid air cells are well aerated. The calvarium is intact. IMPRESSION: No acute intracranial pathology. Age-related atrophy and chronic  microvascular ischemic disease. 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 Clinical correlation is recommended. If symptoms persist and there are no contraindications, MRI may provide better evaluation if clinically indicated. Electronically Signed   By: Anner Crete M.D.   On: 03/19/2015 05:11   I have personally reviewed and evaluated these images and lab results as part of my medical decision-making.   EKG Interpretation None      MDM   Final diagnoses:  None   TIA versus CVA  Presents to the emergency department for evaluation of right sided numbness and tingling. Patient had similar symptoms 24 hours ago that resolved. She went to bed last night at 10 PM feeling fine and then woke up this morning and noticed recurrence of the numbness on the right side of her body. No speech difficulty. Neurologic examination reveals some mild subjective decrease in sensation right side versus left, possibly slight right-sided pronator drift. Remainder of examination,  however, was unremarkable.  Patient awakened with the symptoms, last known normal was 10 PM last night. Symptoms are also extremely mild. She is therefore not a candidate for thrombolytic intervention.  Workup is negative for acute abnormality on CT scan and lab work is normal. Discussed briefly with Dr. Armida Sans, neurology. Neurology will consult, patient to be admitted to the hospitalist service for workup.   Orpah Greek, MD 03/19/15 0600  Orpah Greek, MD 03/19/15 [phone]

## 2015-03-19 NOTE — H&P (Signed)
[facility] History and Physical  Jaemi Yearian [id number] DOB: Mar 12, 1951 DOA: 03/19/2015  Referring physician: Betsey Holiday PCP: Marton Redwood, MD   Chief Complaint: right sided numbness HPI: Kylie Calhoun is a very pleasant 64 y.o. female past medical history includes bladder cancer, osteopenia, diverticulosis presents emergency Department chief complaint right-sided numbness. She was evaluated by neurology in the emergency department who recommended admission for TIA workup.  Information is obtained from the patient who said she was in her usual state of health when she went to bed at 10:00 last night she was awakened 3 AM went to the bathroom and said "had difficulty walking". She describes right leg dragging as well as numbness sensation in her right arm right leg and right face. She reports still having right sided numbness at time of admission. She denies headache visual disturbances difficulty chewing or swallowing. She denies any weaknesses of her extremities confusion or difficulty speaking. She also reports having had same symptoms very briefly intermittently more than 24 hours prior to presentation. She denies any chest pain palpitations shortness of breath diaphoresis. She denies any abdominal pain nausea vomiting diarrhea constipation. She denies any dysuria hematuria frequency or urgency  In the emergency department CBC with hemoglobin is 16.3 comprehensive metabolic panel unremarkable serum glucose 106, urinalysis unremarkable urine drug screen unremarkable initial troponin negative, EKG with normal sinus rhythm. CT of the brain without acute abnormality  In the emergency department she is afebrile hemodynamically stable and not hypoxic. She is evaluated by neurology who opine left thalamic lacunar infarct versus recurrent TIA per his note. Currently out of the window for IV TPA.   Review of Systems:  10 point review of systems complete and all systems are negative except  as indicated in history of present illness  Past Medical History  Diagnosis Date  . Bladder cancer ([place]) 2011    3 times  . Hx of osteopenia   . Diverticulosis     mild per colonoscopy  . Hemorrhoid     internal per colonoscopy 2016   Past Surgical History  Procedure Laterality Date  . 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 Tonsillectomy    . Abdominal hysterectomy    . Appendectomy    . Bladder surgery      scope and removal of tumors   Social History:  reports that she has never smoked. She has never used smokeless tobacco. She reports that she drinks about 0.6 oz of alcohol per week. She reports that she does not use illicit drugs. 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 She is independent with ADLs   Family History  Problem Relation Age of Onset  . Heart disease Father   . Stroke Sister   . Hypertension Sister   . Stroke Brother   . Hypertension Brother     Prior to Admission medications   Medication Sig Start Date End Date Taking? Authorizing Provider  CALCIUM PO Take 2 tablets by mouth daily.   Yes Historical Provider, MD  cetirizine (ZYRTEC) 10 MG tablet Take 5 mg by mouth daily.   Yes Historical Provider, MD  cholecalciferol (VITAMIN D) 1000 UNITS tablet Take 2,000 Units by mouth daily.   Yes Historical Provider, MD   Physical Exam: Filed Vitals:   03/19/15 0537 03/19/15 0600 03/19/15 0640 03/19/15 0800  BP:  149/90  135/75  Pulse: 74 66 64 66  Temp:      TempSrc:      Resp: 19 18 19 14   SpO2: 97% 96% 97% 100%    Wt Readings from Last 3 Encounters:  05/19/14 59.875 kg (132 lb)  05/09/14 60.238 kg (132 lb 12.8 oz)    General:  Appears calm and comfortable Eyes: PERRL, normal lids, irises & conjunctiva ENT: grossly normal hearing, lips & tongue Neck: no LAD, masses or thyromegaly Cardiovascular: RRR, no m/r/g. No LE edema. Telemetry: SR, no  arrhythmias  Respiratory: CTA bilaterally, no w/r/r. Normal respiratory effort. Abdomen: soft, ntnd no guarding or rebounding positive bowel sounds Skin: no rash or induration seen on limited exam Musculoskeletal: grossly normal tone BUE/BLE Psychiatric: grossly normal mood and affect, speech fluent and appropriate Neurologic: grossly non-focal. speech clear facial symmetry cranial nerves II through XII intact           Labs on Admission:  Basic Metabolic Panel:  Recent Labs Lab 03/19/15 0450 03/19/15 0509  NA 140 143  K 3.7 3.6  CL 108 107  CO2 22  --   GLUCOSE 111* 106*  BUN 13 13  CREATININE 1.03* 1.00  CALCIUM 9.0  --    Liver Function Tests:  Recent Labs Lab 03/19/15 0450  AST 24  ALT 21  ALKPHOS 61  BILITOT 1.3*  PROT 6.4*  ALBUMIN 4.1   No results for input(s): LIPASE, AMYLASE in the last 168 hours. No results for input(s): AMMONIA in the last 168 hours. CBC:  Recent Labs Lab 03/19/15 0450 03/19/15 0509  WBC 7.1  --   NEUTROABS 2.9  --   HGB 14.6 16.3*  HCT 44.8 48.0*  MCV 90.0  --   PLT 252  --    Cardiac Enzymes: No results for input(s): CKTOTAL, CKMB, CKMBINDEX, TROPONINI in the last 168 hours.  BNP (last 3 results) No results for input(s): BNP in the last 8760 hours.  ProBNP (last 3 results) No results for input(s): PROBNP in the last 8760 hours.   Creatinine clearance cannot be calculated (Unknown ideal weight.)  CBG: No results for input(s): GLUCAP in the last 168 hours.  Radiological Exams on Admission: Ct Head Wo Contrast  03/19/2015  CLINICAL DATA:  64 year old female with right-sided numbness EXAM: CT HEAD WITHOUT CONTRAST TECHNIQUE: Contiguous axial images were obtained from the base of the skull through the vertex without intravenous contrast. COMPARISON:  None. FINDINGS: There is dilatation of the ventricles out of proportion with the sulci which may represent central volume loss versus normal pressure hydrocephalus. Clinical  correlation is recommended. Periventricular and deep white matter hypodensities represent chronic microvascular ischemic changes. There is no intracranial hemorrhage. No mass effect or midline shift identified. The visualized paranasal sinuses and mastoid air cells are well aerated. The calvarium is intact. IMPRESSION: No acute intracranial pathology. Age-related atrophy and chronic microvascular ischemic disease. 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 Clinical correlation is recommended. If symptoms persist and there are no contraindications, MRI may provide better evaluation if clinically indicated. Electronically Signed   By: Anner Crete M.D.   On: 03/19/2015 05:11   Mr Jodene Nam Head Wo Contrast  03/19/2015  CLINICAL DATA:  New onset numbness in the right face, arm, and leg. History of bladder cancer. EXAM: MRI HEAD WITHOUT CONTRAST MRA HEAD WITHOUT CONTRAST TECHNIQUE: Multiplanar, multiecho pulse sequences of the brain and surrounding structures were obtained without intravenous contrast. Angiographic images of the head were obtained using MRA technique without contrast.  COMPARISON:  Head CT 03/19/2015 FINDINGS: MRI HEAD FINDINGS There is a 5 mm likely early subacute infarct involving the lateral left thalamus. There is no evidence of intracranial hemorrhage, mass, midline shift, or extra-axial fluid collection. There is moderate enlargement of the lateral and third ventricles which is slightly greater than the degree of sulcal enlargement. Periventricular white matter T2 hyperintensities are nonspecific but compatible with mild chronic small vessel ischemic disease. Chronic ischemic changes are also noted in the right thalamus. Orbits are unremarkable. A small mucous retention cyst is noted in a posterior left ethmoid air cell. There is a trace right mastoid effusion. Major intracranial vascular flow voids are preserved. MRA HEAD FINDINGS The visualized distal vertebral arteries are patent without stenosis. The right  vertebral artery is hypoplastic distal to the PICA origin. SCA origins are patent. Basilar artery is patent without stenosis. There are left larger than right posterior communicating arteries with the left P1 segment being hypoplastic. There is a moderate proximal left P2 stenosis, and there is asymmetric attenuation of PCA branch vessels on the right. Internal carotid arteries are patent from skullbase to carotid termini without evidence of significant stenosis. ACAs and MCAs are patent without evidence of significant proximal stenosis or major branch vessel occlusion. There is mild MCA branch vessel irregularity bilaterally. No intracranial aneurysm is identified. IMPRESSION: 1. 5 mm left thalamic infarct, likely early subacute. 2. Ventricular enlargement favored to reflect moderate cerebral atrophy over hydrocephalus. 3. Mild chronic small vessel ischemic disease. 4. No major intracranial arterial occlusion. 5. Moderate proximal left P2 PCA stenosis. Electronically Signed   By: Logan Bores M.D.   On: 03/19/2015 07:46   Mr Brain Wo Contrast  03/19/2015  CLINICAL DATA:  New onset numbness in the right face, arm, and leg. History of bladder cancer. EXAM: MRI HEAD WITHOUT CONTRAST MRA HEAD WITHOUT CONTRAST TECHNIQUE: Multiplanar, multiecho pulse sequences of the brain and surrounding structures were obtained without intravenous contrast. Angiographic images of the head were obtained using MRA technique without contrast. COMPARISON:  Head CT 03/19/2015 FINDINGS: MRI HEAD FINDINGS There is a 5 mm likely early subacute infarct involving the lateral left thalamus. There is no evidence of intracranial hemorrhage, mass, midline shift, or extra-axial fluid collection. There is moderate enlargement of the lateral and third ventricles which is slightly greater than the degree of sulcal enlargement. Periventricular white matter T2 hyperintensities are nonspecific but compatible with mild chronic small vessel ischemic  disease. Chronic ischemic changes are also noted in the right thalamus. Orbits are unremarkable. A small mucous retention cyst is noted in a posterior left ethmoid air cell. There is a trace right mastoid effusion. Major intracranial vascular flow voids are preserved. MRA HEAD FINDINGS The visualized distal vertebral arteries are patent without stenosis. The right vertebral artery is hypoplastic distal to the PICA origin. SCA origins are patent. Basilar artery is patent without stenosis. There are left larger than right posterior communicating arteries with the left P1 segment being hypoplastic. There is a moderate proximal left P2 stenosis, and there is asymmetric attenuation of PCA branch vessels on the right. Internal carotid arteries are patent from skullbase to carotid termini without evidence of significant stenosis. ACAs and MCAs are patent without evidence of significant proximal stenosis or major branch vessel occlusion. There is mild MCA branch vessel irregularity bilaterally. No intracranial aneurysm is identified. IMPRESSION: 1. 5 mm left thalamic infarct, likely early subacute. 2. Ventricular enlargement favored to reflect moderate cerebral atrophy over hydrocephalus. 3. Mild chronic small vessel  ischemic disease. 4. No major intracranial arterial occlusion. 5. Moderate proximal left P2 PCA stenosis. Electronically Signed   By: Logan Bores M.D.   On: 03/19/2015 07:46    EKG: Independently reviewed SR   Assessment/Plan Principal Problem:   TIA (transient ischemic attack) Active Problems:   Hx of osteopenia   Bladder cancer ([facility])   Unsteady gait  #1. Stroke. Per MRI.  Patient presents with right-sided numbness intermittently more than 24 hours ago. Continues with right sided numbness at time of admission. Evaluated by neurology in the emergency department recommended TIA workup - carotid Dopplers 2-D echo hemoglobin A1c lipid panel  -Bedside swallow eval and then heart healthy diet if she  passes. Nothing by mouth until then -Aspirin per neurology -Statin -PT OT speech therapy  -BP stable. Not on any antihypertensive medication. Will monitor  #2. History of bladder cancer with 2 recurrences. -Stable  #3. History of osteopenia. -Stable continue home med's  Dr Armida Sans neurology  Code Status: full DVT Prophylaxis: Family Communication: none present Disposition Plan:  Home hopefully 24 hours  Time spent:   [facility]

## 2015-03-19 NOTE — ED Notes (Signed)
Pt reports numbness to R arm, leg and side. Pt woke up with symptoms. Last known well at 2200 yesterday, also happened Saturday - pt not very clear about last known well. Pt reports feeling "unbalanced"

## 2015-03-19 NOTE — ED Notes (Signed)
Pt taken to CT.

## 2015-03-19 NOTE — ED Notes (Signed)
MRI called to obtain permission to get patient

## 2015-03-19 NOTE — ED Notes (Signed)
NP aware pt returned from MRI.  Attempted report

## 2015-03-20 ENCOUNTER — Observation Stay (HOSPITAL_COMMUNITY): Payer: Medicare Other

## 2015-03-20 DIAGNOSIS — I6789 Other cerebrovascular disease: Secondary | ICD-10-CM | POA: Diagnosis not present

## 2015-03-20 DIAGNOSIS — I739 Peripheral vascular disease, unspecified: Secondary | ICD-10-CM | POA: Diagnosis present

## 2015-03-20 DIAGNOSIS — I6523 Occlusion and stenosis of bilateral carotid arteries: Secondary | ICD-10-CM | POA: Diagnosis present

## 2015-03-20 DIAGNOSIS — E785 Hyperlipidemia, unspecified: Secondary | ICD-10-CM | POA: Insufficient documentation

## 2015-03-20 DIAGNOSIS — R200 Anesthesia of skin: Secondary | ICD-10-CM | POA: Diagnosis present

## 2015-03-20 DIAGNOSIS — I639 Cerebral infarction, unspecified: Secondary | ICD-10-CM | POA: Diagnosis present

## 2015-03-20 DIAGNOSIS — Z8551 Personal history of malignant neoplasm of bladder: Secondary | ICD-10-CM | POA: Diagnosis not present

## 2015-03-20 DIAGNOSIS — C679 Malignant neoplasm of bladder, unspecified: Secondary | ICD-10-CM | POA: Diagnosis present

## 2015-03-20 DIAGNOSIS — R262 Difficulty in walking, not elsewhere classified: Secondary | ICD-10-CM | POA: Diagnosis present

## 2015-03-20 DIAGNOSIS — K5790 Diverticulosis of intestine, part unspecified, without perforation or abscess without bleeding: Secondary | ICD-10-CM | POA: Diagnosis present

## 2015-03-20 DIAGNOSIS — M8580 Other specified disorders of bone density and structure, unspecified site: Secondary | ICD-10-CM | POA: Diagnosis present

## 2015-03-20 DIAGNOSIS — R29700 NIHSS score 0: Secondary | ICD-10-CM | POA: Diagnosis present

## 2015-03-20 DIAGNOSIS — Z8739 Personal history of other diseases of the musculoskeletal system and connective tissue: Secondary | ICD-10-CM | POA: Diagnosis not present

## 2015-03-20 LAB — HEMOGLOBIN A1C
HEMOGLOBIN A1C: 5.9 % — AB (ref 4.8–5.6)
Mean Plasma Glucose: 123 mg/dL

## 2015-03-20 LAB — LIPID PANEL
CHOLESTEROL: 192 mg/dL (ref 0–200)
HDL: 47 mg/dL (ref >40)
LDL Cholesterol: 119 mg/dL — ABNORMAL HIGH (ref 0–99)
Total CHOL/HDL Ratio: 4.1 RATIO
Triglycerides: 131 mg/dL (ref <150)
VLDL: 26 mg/dL (ref 0–40)

## 2015-03-20 MED ORDER — ASPIRIN EC 325 MG PO TBEC
325.0000 mg | DELAYED_RELEASE_TABLET | Freq: Every day | ORAL | Status: DC
  Administered 2015-03-21: 325 mg via ORAL
  Filled 2015-03-20: qty 1

## 2015-03-20 NOTE — Progress Notes (Signed)
PROGRESS NOTE    Kylie Calhoun [id number] DOB: 1950/06/09 DOA: 03/19/2015 PCP: Marton Redwood, MD  HPI/Brief narrative 64 year old right-handed female patient with history of bladder cancer presented to [facility] ED on 03/19/15 with numbness of right side face, arm and leg. She had a brief episode of right shoulder numbness on Saturday night which resolved spontaneously after 2-3 hours. She woke up on 03/19/15 at 3 AM with numbness of right arm which subsequently involved the right side of face and right leg. This was associated with mild dragging of right leg. CT head was negative for stroke. Subsequent MRI shows left thalamic infarct. Evaluating for stroke. Neurology consulting.  Assessment/Plan:  Stroke: Left thalamic lacunar infarct - With residual right sensory deficit-now resolved - Etiology: Small vessel disease - MRI brain: Left thalamic infarct - MRA brain: No large vessel occlusion, moderate Left P2 stenosis - 2-D echo: Pending - Carotid Doppler: Bilateral 1-39 percent ICA stenosis. Vertebral artery flow is antegrade - LDL: 119 - Hemoglobin A1c: Pending - Not on antithrombotic prior to admission. Now on aspirin 325 MG daily - PT, OT and ST evaluation: Outpatient PT (neuro rehabilitation) but no ST or OT follow-up  Hyperlipidemia - LDL 119, goal <70 - Started Lipitor 10 MG daily  Recurrent bladder cancer - Outpatient follow-up   DVT prophylaxis: Lovenox Code Status: Full Family Communication: None at bedside Disposition Plan: DC home possibly 12/7 pending completing stroke workup   Consultants:  Neurology/stroke service  Procedures:  None  Antibiotics:  None   Subjective: Denies complaints. States that her right-sided sensory symptoms resolved when she woke up this morning.  Objective: Filed Vitals:   03/19/15 2200 03/20/15 0122 03/20/15 0922 03/20/15 1336  BP: 136/79 124/64 116/76 139/68  Pulse: 70 72 67 67  Temp: 97.8 F (36.6 C) 97.7 F (36.5 C)  97.6 F (36.4 C) 97.8 F (36.6 C)  TempSrc: Oral Oral Oral Oral  Resp: 17 18 16 16   SpO2: 99% 98% 100% 100%    Intake/Output Summary (Last 24 hours) at 03/20/15 1601 Last data filed at 03/20/15 1337  Gross per 24 hour  Intake    960 ml  Output      0 ml  Net    960 ml   There were no vitals filed for this visit.   Exam:  General exam: Pleasant middle-aged female sitting up comfortably in chair this afternoon. Respiratory system: Clear. No increased work of breathing. Cardiovascular system: S1 & S2 heard, RRR. No JVD, murmurs, gallops, clicks or pedal edema. Telemetry: Sinus rhythm Gastrointestinal system: Abdomen is nondistended, soft and nontender. Normal bowel sounds heard. Central nervous system: Alert and oriented. No focal neurological deficits. Extremities: Symmetric 5 x 5 power.   Data Reviewed: Basic Metabolic Panel:  Recent Labs Lab 03/19/15 0450 03/19/15 0509  NA 140 143  K 3.7 3.6  CL 108 107  CO2 22  --   GLUCOSE 111* 106*  BUN 13 13  CREATININE 1.03* 1.00  CALCIUM 9.0  --    Liver Function Tests:  Recent Labs Lab 03/19/15 0450  AST 24  ALT 21  ALKPHOS 61  BILITOT 1.3*  PROT 6.4*  ALBUMIN 4.1   No results for input(s): LIPASE, AMYLASE in the last 168 hours. No results for input(s): AMMONIA in the last 168 hours. CBC:  Recent Labs Lab 03/19/15 0450 03/19/15 0509  WBC 7.1  --   NEUTROABS 2.9  --   HGB 14.6 16.3*  HCT 44.8 48.0*  MCV 90.0  --  PLT 252  --    Cardiac Enzymes: No results for input(s): CKTOTAL, CKMB, CKMBINDEX, TROPONINI in the last 168 hours. BNP (last 3 results) No results for input(s): PROBNP in the last 8760 hours. CBG: No results for input(s): GLUCAP in the last 168 hours.  No results found for this or any previous visit (from the past 240 hour(s)).       Studies: Dg Chest 2 View  03/19/2015  CLINICAL DATA:  TIA, right side weakness EXAM: CHEST  2 VIEW COMPARISON:  None. FINDINGS: Cardiomediastinal  silhouette is unremarkable. No acute infiltrate or pleural effusion. No pulmonary edema. Mild dextroscoliosis mid thoracic spine. Mild levoscoliosis lower thoracic spine. IMPRESSION: No active cardiopulmonary disease.  Mild thoracic scoliosis. Electronically Signed   By: Lahoma Crocker M.D.   On: 03/19/2015 12:09   Ct Head Wo Contrast  03/19/2015  CLINICAL DATA:  64 year old female with right-sided numbness EXAM: CT HEAD WITHOUT CONTRAST TECHNIQUE: Contiguous axial images were obtained from the base of the skull through the vertex without intravenous contrast. COMPARISON:  None. FINDINGS: There is dilatation of the ventricles out of proportion with the sulci which may represent central volume loss versus normal pressure hydrocephalus. Clinical correlation is recommended. Periventricular and deep white matter hypodensities represent chronic microvascular ischemic changes. There is no intracranial hemorrhage. No mass effect or midline shift identified. The visualized paranasal sinuses and mastoid air cells are well aerated. The calvarium is intact. IMPRESSION: No acute intracranial pathology. Age-related atrophy and chronic microvascular ischemic disease. 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 Clinical correlation is recommended. If symptoms persist and there are no contraindications, MRI may provide better evaluation if clinically indicated. Electronically Signed   By: Anner Crete M.D.   On: 03/19/2015 05:11   Mr Jodene Nam Head Wo Contrast  03/19/2015  CLINICAL DATA:  New onset numbness in the right face, arm, and leg. History of bladder cancer. EXAM: MRI HEAD WITHOUT CONTRAST MRA HEAD WITHOUT CONTRAST TECHNIQUE: Multiplanar, multiecho pulse sequences of the brain and surrounding structures were obtained without intravenous contrast. Angiographic images of the head were obtained using MRA technique without contrast. COMPARISON:  Head CT 03/19/2015 FINDINGS: MRI HEAD FINDINGS There is a 5 mm likely early subacute infarct  involving the lateral left thalamus. There is no evidence of intracranial hemorrhage, mass, midline shift, or extra-axial fluid collection. There is moderate enlargement of the lateral and third ventricles which is slightly greater than the degree of sulcal enlargement. Periventricular white matter T2 hyperintensities are nonspecific but compatible with mild chronic small vessel ischemic disease. Chronic ischemic changes are also noted in the right thalamus. Orbits are unremarkable. A small mucous retention cyst is noted in a posterior left ethmoid air cell. There is a trace right mastoid effusion. Major intracranial vascular flow voids are preserved. MRA HEAD FINDINGS The visualized distal vertebral arteries are patent without stenosis. The right vertebral artery is hypoplastic distal to the PICA origin. SCA origins are patent. Basilar artery is patent without stenosis. There are left larger than right posterior communicating arteries with the left P1 segment being hypoplastic. There is a moderate proximal left P2 stenosis, and there is asymmetric attenuation of PCA branch vessels on the right. Internal carotid arteries are patent from skullbase to carotid termini without evidence of significant stenosis. ACAs and MCAs are patent without evidence of significant proximal stenosis or major branch vessel occlusion. There is mild MCA branch vessel irregularity bilaterally. No intracranial aneurysm is identified. IMPRESSION: 1. 5 mm left thalamic infarct, likely early subacute. 2.  Ventricular enlargement favored to reflect moderate cerebral atrophy over hydrocephalus. 3. Mild chronic small vessel ischemic disease. 4. No major intracranial arterial occlusion. 5. Moderate proximal left P2 PCA stenosis. Electronically Signed   By: Logan Bores M.D.   On: 03/19/2015 07:46   Mr Brain Wo Contrast  03/19/2015  CLINICAL DATA:  New onset numbness in the right face, arm, and leg. History of bladder cancer. EXAM: MRI HEAD  WITHOUT CONTRAST MRA HEAD WITHOUT CONTRAST TECHNIQUE: Multiplanar, multiecho pulse sequences of the brain and surrounding structures were obtained without intravenous contrast. Angiographic images of the head were obtained using MRA technique without contrast. COMPARISON:  Head CT 03/19/2015 FINDINGS: MRI HEAD FINDINGS There is a 5 mm likely early subacute infarct involving the lateral left thalamus. There is no evidence of intracranial hemorrhage, mass, midline shift, or extra-axial fluid collection. There is moderate enlargement of the lateral and third ventricles which is slightly greater than the degree of sulcal enlargement. Periventricular white matter T2 hyperintensities are nonspecific but compatible with mild chronic small vessel ischemic disease. Chronic ischemic changes are also noted in the right thalamus. Orbits are unremarkable. A small mucous retention cyst is noted in a posterior left ethmoid air cell. There is a trace right mastoid effusion. Major intracranial vascular flow voids are preserved. MRA HEAD FINDINGS The visualized distal vertebral arteries are patent without stenosis. The right vertebral artery is hypoplastic distal to the PICA origin. SCA origins are patent. Basilar artery is patent without stenosis. There are left larger than right posterior communicating arteries with the left P1 segment being hypoplastic. There is a moderate proximal left P2 stenosis, and there is asymmetric attenuation of PCA branch vessels on the right. Internal carotid arteries are patent from skullbase to carotid termini without evidence of significant stenosis. ACAs and MCAs are patent without evidence of significant proximal stenosis or major branch vessel occlusion. There is mild MCA branch vessel irregularity bilaterally. No intracranial aneurysm is identified. IMPRESSION: 1. 5 mm left thalamic infarct, likely early subacute. 2. Ventricular enlargement favored to reflect moderate cerebral atrophy over  hydrocephalus. 3. Mild chronic small vessel ischemic disease. 4. No major intracranial arterial occlusion. 5. Moderate proximal left P2 PCA stenosis. Electronically Signed   By: Logan Bores M.D.   On: 03/19/2015 07:46        Scheduled Meds: . [START ON 03/21/2015] aspirin EC  325 mg Oral Daily  . atorvastatin  10 mg Oral q1800  . calcium carbonate  1 tablet Oral Q breakfast  . enoxaparin (LOVENOX) injection  30 mg Subcutaneous Q24H   Continuous Infusions:   Principal Problem:   Ischemic stroke (HCC) Active Problems:   TIA (transient ischemic attack)   Hx of osteopenia   Bladder cancer ([place])   Unsteady gait   Malignant neoplasm of urinary bladder (HCC)   Stroke ([place])    Time spent: 25 minutes.    Vernell Leep, MD, FACP, FHM. [facility] Pager [phone]  If 7PM-7AM, please contact night-coverage [email or web address] Password [email or web address] 03/20/2015, 4:01 PM    LOS: 0 days

## 2015-03-20 NOTE — Evaluation (Signed)
Speech Language Pathology Evaluation Patient Details Name: Kylie Calhoun MRN: [id number] DOB: Mar 14, 1951 Today's Date: 03/20/2015 Time: [id number] SLP Time Calculation (min) (ACUTE ONLY): 12 min  Problem List:  Patient Active Problem List   Diagnosis Date Noted  . TIA (transient ischemic attack) 03/19/2015  . Unsteady gait 03/19/2015  . Hx of osteopenia   . Bladder cancer ([place])   . Hemorrhoid   . Diverticulosis   . Ischemic stroke ([place])   . Malignant neoplasm of urinary bladder [facility])    Past Medical History:  Past Medical History  Diagnosis Date  . Bladder cancer ([place]) 2011    3 times  . Hx of osteopenia   . Diverticulosis     mild per colonoscopy  . Hemorrhoid     internal per colonoscopy 2016   Past Surgical History:  Past Surgical History  Procedure Laterality Date  . 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 Tonsillectomy    . Abdominal hysterectomy    . Appendectomy    . Bladder surgery      scope and removal of tumors   HPI:  64 y.o. female past medical history includes bladder cancer, osteopenia, diverticulosis presents emergency Department chief complaint right-sided numbness. MRI with 5 mm left thalamic infarct.    Assessment / Plan / Recommendation Clinical Impression  Cognitive-linguistic evaluation complete. Patient presents with baseline and normal cognitive-linguistic function. No further SLP needs indicated.     SLP Assessment  Patient does not need any further Speech Lanaguage Pathology Services    Follow Up Recommendations  None          SLP Evaluation Prior Functioning  Cognitive/Linguistic Baseline: Within functional limits Type of Home: House (townhome) Available Help at Discharge: Family      Comprehension  Auditory Comprehension Overall Auditory Comprehension: Appears within functional limits for tasks assessed Visual Recognition/Discrimination Discrimination: Within Function Limits Reading Comprehension Reading  Status: Not tested    Expression Expression Primary Mode of Expression: Verbal Verbal Expression Overall Verbal Expression: Appears within functional limits for tasks assessed Written Expression Dominant Hand: Right   Oral / Motor Oral Motor/Sensory Function Overall Oral Motor/Sensory Function: Within functional limits Motor Speech Overall Motor Speech: Appears within functional limits for tasks assessed   Kylie Rainwater MA, CCC-SLP [phone]  Kylie Calhoun Kylie Calhoun 03/20/2015, 11:00 AM

## 2015-03-20 NOTE — Progress Notes (Signed)
STROKE TEAM PROGRESS NOTE   HISTORY Kylie Calhoun is an 64 y.o. female, right handed,with a past medical history pertinent for bladder cancer, presents to the ED with complains of new onset numbness right face-arm-leg.She said that she went to bed around 10 pm last night feeling well (LKW 03/18/2015 at 2200) but woke up at 3 am today 03/19/2015 with a numb sensation in her right arm that subsequently travelled to the right leg and then the right face and is still ongoing. Said that at some point she was dragging the right leg but it resolved.Importantly, she indicated that on Saturday night she had transient numbness of the right arm but she did not seek medical help. Denies associated HA, vertigo, double vision, difficulty swallowing, arm weakness, confusion, language or vision impairment. CT brain showed no acute abnormality. Serologies including etoh level are unrevealing. NIHSS: 0.  MRS: 0. Patient was not administered TPA secondary to being out of the window. She was admitted for further evaluation and treatment.   SUBJECTIVE (INTERVAL HISTORY) No family is at the bedside.  Overall she feels her condition is rapidly improving. Her son and daughter are concerned. Notes given pt about current condition. Patient goes to aerobics 3x/wk.   OBJECTIVE Temp:  [96.1 F (35.6 C)-99.5 F (37.5 C)] 97.7 F (36.5 C) (12/06 0122) Pulse Rate:  [64-80] 72 (12/06 0122) Cardiac Rhythm:  [-] Normal sinus rhythm (12/05 1900) Resp:  [14-18] 18 (12/06 0122) BP: (124-140)/(64-96) 124/64 mmHg (12/06 0122) SpO2:  [96 %-100 %] 98 % (12/06 0122)  CBC:  Recent Labs Lab 03/19/15 0450 03/19/15 0509  WBC 7.1  --   NEUTROABS 2.9  --   HGB 14.6 16.3*  HCT 44.8 48.0*  MCV 90.0  --   PLT 252  --     Basic Metabolic Panel:  Recent Labs Lab 03/19/15 0450 03/19/15 0509  NA 140 143  K 3.7 3.6  CL 108 107  CO2 22  --   GLUCOSE 111* 106*  BUN 13 13  CREATININE 1.03* 1.00  CALCIUM 9.0  --     Lipid  Panel: No results found for: CHOL, TRIG, HDL, CHOLHDL, VLDL, LDLCALC HgbA1c: No results found for: HGBA1C Urine Drug Screen:    Component Value Date/Time   LABOPIA NONE DETECTED 03/19/2015 0532   COCAINSCRNUR NONE DETECTED 03/19/2015 0532   LABBENZ NONE DETECTED 03/19/2015 0532   AMPHETMU NONE DETECTED 03/19/2015 0532   THCU NONE DETECTED 03/19/2015 0532   LABBARB NONE DETECTED 03/19/2015 0532      IMAGING  Dg Chest 2 View 03/19/2015  No active cardiopulmonary disease.  Mild thoracic scoliosis.   Ct Head Wo Contrast 03/19/2015  No acute intracranial pathology. Age-related atrophy and chronic microvascular ischemic disease. 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 Clinical correlation is recommended.   Mri & Mra Brain Wo Contrast 03/19/2015  1. 5 mm left thalamic infarct, likely early subacute. 2. Ventricular enlargement favored to reflect moderate cerebral atrophy over hydrocephalus. 3. Mild chronic small vessel ischemic disease. 4. No major intracranial arterial occlusion. 5. Moderate proximal left P2 PCA stenosis.   Carotid Doppler   Bilateral: 1-39% ICA stenosis. Vertebral artery flow is antegrade.  2D Echocardiogram  pending    PHYSICAL EXAM General - young appearing for age, Well nourished, well developed, in no apparent distress.  Cardiovascular - Regular rate and rhythm.  Mental Status -  Level of arousal and orientation to time, place, and person were intact. Language including expression, naming, repetition, comprehension was assessed and found  intact. Attention span and concentration were normal.  Cranial Nerves  II - Visual field intact OU. III, IV, VI - Extraocular movements intact. V - Facial sensation intact bilaterally. VII - Facial movement intact bilaterally. XII - Tongue protrusion intact.  Motor Strength - The patient's strength was normal in all extremities and pronator drift was absent.  Bulk was normal and fasciculations were absent.   Motor Tone - Muscle tone  was normal.  Sensory - Light touch symmetrical.    Coordination - The patient had normal movements in the hands and feet with no ataxia or dysmetria.  Tremor was absent.   ASSESSMENT/PLAN Ms. Kylie Calhoun is a 65 y.o. female with history of bladder cancer presenting with right sided numbness face-arm-leg. She did not receive IV t-PA due to delay in arrival.   Stroke:  left thalamic lacunar infarct with pure sensory syndrome, secondary to small vessel disease source  Resultant  Subjective R face numbness, other sx resolved  MRI  L thalamic infarct  MRA  No large vessel occlusion, mod L P2 stenosis  Carotid Doppler  unremarkable  2D Echo  pending   LDL 119  HgbA1c pending  Lovenox 30 mg sq daily for VTE prophylaxis  Diet heart healthy/carb modified Room service appropriate?: Yes; Fluid consistency:: Thin  No antithrombotic prior to admission, now on aspirin 81 mg daily. Increase aspirin dose to 325 mg daily. Consider plavix based on test results.  Patient counseled to be compliant with her antithrombotic medications  Ongoing aggressive stroke risk factor management  Therapy recommendations:  Pending. No therapy needs assessed  Continue weekly exercise  Disposition:  Return home once workup completed  Hyperglycemia  106, 111  HgbA1c pending   Hyperlipidemia  LDL 119   No statin PTA  Now on lipitor 10 mg daily, goal LDL < 70   Dietary education, low fat diet addressed  Continue statin on discharge  Other Stroke Risk Factors  Advanced age  ETOH use  Family hx stroke (sister, brother)  Other Active Problems  Hx bladder cancer with 2 recurrences, currently "cured" as per pt  Osteopenia  Hgb 16.3   Hospital day # [place] for Pager information 03/20/2015 9:14 AM   I, the attending vascular neurologist, have personally obtained a history, examined the patient, evaluated laboratory data, individually  viewed imaging studies and agree with radiology interpretations. I also discussed with pt and Dr. Algis Liming regarding her care plan. Together with the NP/PA, we formulated the assessment and plan of care which reflects our mutual decision.  I have made any additions or clarifications directly to the above note and agree with the findings and plan as currently documented.   64 yo F presented with right sided numbness, MRI showed left thalamic small infarct, most likely small vessel disease. MRA showed left P2 moderate segmental stenosis. CUS negative and 2D pending. LDL 119 and A1C pending. Currently on ASA. No significant risk factor so far, will recommend switch to plavix for stroke prevention. Continue lipitor on discharge.  Rosalin Hawking, MD PhD Stroke Neurology 03/20/2015 5:04 PM       To contact Stroke Continuity provider, please refer to [email or web address] After hours, contact General Neurology

## 2015-03-20 NOTE — Evaluation (Signed)
Occupational Therapy Evaluation Patient Details Name: Kylie Calhoun MRN: [id number] DOB: 04/19/1950 Today's Date: 03/20/2015    History of Present Illness Kylie Calhoun is an 64 y.o. female, right handed,with a past medical history pertinent for bladder cancer, presents to the ED with complains of new onset numbness right face-arm-leg.  MRI positive for 1. 5 mm left thalamic infarct, likely early subacute.    Clinical Impression   Pt is able to perform ADL and ADL transfers independently. R UE function is WNL for ADL. Educated pt in s/s of CVA and importance of immediate medical attention. No further OT needs.   Follow Up Recommendations  No OT follow up    Equipment Recommendations  None recommended by OT    Recommendations for Other Services       Precautions / Restrictions Precautions Precautions: None      Mobility Bed Mobility Overal bed mobility: Modified Independent                Transfers Overall transfer level: Independent                    Balance                                 Standardized Balance Assessment Standardized Balance Assessment : Berg Balance Test;Dynamic Gait Index Berg Balance Test Sit to Stand: Able to stand without using hands and stabilize independently Standing Unsupported: Able to stand safely 2 minutes Sitting with Back Unsupported but Feet Supported on Floor or Stool: Able to sit safely and securely 2 minutes Stand to Sit: Sits safely with minimal use of hands Transfers: Able to transfer safely, minor use of hands Standing Unsupported with Eyes Closed: Able to stand 10 seconds safely Standing Ubsupported with Feet Together: Able to place feet together independently and stand for 1 minute with supervision From Standing, Reach Forward with Outstretched Arm: Can reach confidently >25 cm (10") From Standing Position, Pick up Object from Floor: Able to pick up shoe safely and easily From Standing Position,  Turn to Look Behind Over each Shoulder: Looks behind from both sides and weight shifts well Turn 360 Degrees: Able to turn 360 degrees safely one side only in 4 seconds or less Standing Unsupported, Alternately Place Feet on Step/Stool: Able to stand independently and safely and complete 8 steps in 20 seconds Standing Unsupported, One Foot in Front: Able to plae foot ahead of the other independently and hold 30 seconds Standing on One Leg: Tries to lift leg/unable to hold 3 seconds but remains standing independently Total Score: 50 Dynamic Gait Index Level Surface: Mild Impairment Change in Gait Speed: Mild Impairment Gait with Horizontal Head Turns: Normal Gait with Vertical Head Turns: Normal Gait and Pivot Turn: Mild Impairment Step Over Obstacle: Mild Impairment Step Around Obstacles: Normal Steps: Mild Impairment Total Score: 19      ADL Overall ADL's : Independent                                       General ADL Comments: Pt demonstrating ability to use L UE functionally for fasteners, texting and handwriting.     Vision     Perception     Praxis      Pertinent Vitals/Pain Pain Assessment: No/denies pain     Hand Dominance Right   Extremity/Trunk  Assessment Upper Extremity Assessment Upper Extremity Assessment: Overall WFL for tasks assessed   Lower Extremity Assessment Lower Extremity Assessment: Defer to PT evaluation RLE Deficits / Details: AROM and strength WFL, coordination deficits noted with heel to shin LLE Deficits / Details: AROM and strength WFL, coordination deficits noted with heel to shin       Communication Communication Communication: No difficulties   Cognition Arousal/Alertness: Awake/alert Behavior During Therapy: WFL for tasks assessed/performed Overall Cognitive Status: Within Functional Limits for tasks assessed                     General Comments       Exercises       Shoulder Instructions       Home Living Family/patient expects to be discharged to:: Private residence Living Arrangements: Alone Available Help at Discharge: Family;Available PRN/intermittently Type of Home: House (townhome) Home Access: Stairs to enter [employer] of Steps: 2 Entrance Stairs-Rails: None Home Layout: Two level;Bed/bath upstairs Alternate Level Stairs-Number of Steps: flight Alternate Level Stairs-Rails: Left Bathroom Shower/Tub: Occupational psychologist: Standard     Home Equipment: None          Prior Functioning/Environment Level of Independence: Independent             OT Diagnosis: Hemiplegia dominant side   OT Problem List:     OT Treatment/Interventions:      OT Goals(Current goals can be found in the care plan section) Acute Rehab OT Goals Patient Stated Goal: To return to independent  OT Frequency:     Barriers to D/C:            Co-evaluation              End of Session    Activity Tolerance: Patient tolerated treatment well Patient left: in bed;with call bell/phone within reach;with family/visitor present   Time: 1421-1436 OT Time Calculation (min): 15 min Charges:  OT General Charges $OT Visit: 1 Procedure OT Evaluation $Initial OT Evaluation Tier I: 1 Procedure G-Codes:    Malka So 03/20/2015, 2:36 PM  [phone]

## 2015-03-20 NOTE — Evaluation (Signed)
Physical Therapy Evaluation Patient Details Name: Kylie Calhoun MRN: [id number] DOB: [birth year] Today's Date: 03/20/2015   History of Present Illness  Demetriana Cacciatore is an 64 y.o. female, right handed,with a past medical history pertinent for bladder cancer, presents to the ED with complains of new onset numbness right face-arm-leg.  MRI positive for 1. 5 mm left thalamic infarct, likely early subacute.   Clinical Impression  Patient presents with balance deficits for which she has compensated currently, but will benefit from follow up outpatient neurorehabilitation for progression of balance and initiation of HEP.  Will benefit from continued skilled PT in the acute setting to allow return to independent at home.    Follow Up Recommendations Outpatient PT (neurorehabilitation)    Equipment Recommendations  None recommended by PT    Recommendations for Other Services       Precautions / Restrictions Precautions Precautions: Fall      Mobility  Bed Mobility Overal bed mobility: Modified Independent                Transfers Overall transfer level: Modified independent                  Ambulation/Gait Ambulation/Gait assistance: Independent Ambulation Distance (Feet): 200 Feet Assistive device: None Gait Pattern/deviations: Step-through pattern;Wide base of support;Decreased stride length     General Gait Details: seemingly guarded and slower  Stairs Stairs: Yes Stairs assistance: Supervision Stair Management: One rail Left;Alternating pattern;Forwards Number of Stairs: 10 General stair comments: reports more difficulty descending steps  Wheelchair Mobility    Modified Rankin (Stroke Patients Only) Modified Rankin (Stroke Patients Only) Pre-Morbid Rankin Score: No significant disability Modified Rankin: Slight disability     Balance                                 Standardized Balance Assessment Standardized Balance Assessment :  Berg Balance Test;Dynamic Gait Index Berg Balance Test Sit to Stand: Able to stand without using hands and stabilize independently Standing Unsupported: Able to stand safely 2 minutes Sitting with Back Unsupported but Feet Supported on Floor or Stool: Able to sit safely and securely 2 minutes Stand to Sit: Sits safely with minimal use of hands Transfers: Able to transfer safely, minor use of hands Standing Unsupported with Eyes Closed: Able to stand 10 seconds safely Standing Ubsupported with Feet Together: Able to place feet together independently and stand for 1 minute with supervision From Standing, Reach Forward with Outstretched Arm: Can reach confidently >25 cm (10") From Standing Position, Pick up Object from Floor: Able to pick up shoe safely and easily From Standing Position, Turn to Look Behind Over each Shoulder: Looks behind from both sides and weight shifts well Turn 360 Degrees: Able to turn 360 degrees safely one side only in 4 seconds or less Standing Unsupported, Alternately Place Feet on Step/Stool: Able to stand independently and safely and complete 8 steps in 20 seconds Standing Unsupported, One Foot in Front: Able to plae foot ahead of the other independently and hold 30 seconds Standing on One Leg: Tries to lift leg/unable to hold 3 seconds but remains standing independently Total Score: 50   Dynamic Gait Index Level Surface: Mild Impairment Change in Gait Speed: Mild Impairment Gait with Horizontal Head Turns: Normal Gait with Vertical Head Turns: Normal Gait and Pivot Turn: Mild Impairment Step Over Obstacle: Mild Impairment Step Around Obstacles: Normal Steps: Mild Impairment Total Score: 19  Pertinent Vitals/Pain Pain Assessment: No/denies pain    Home Living Family/patient expects to be discharged to:: Private residence Living Arrangements: Alone Available Help at Discharge: Family Type of Home: House (townhome) Home Access: Stairs to  enter Entrance Stairs-Rails: None Technical brewer of Steps: 2 Home Layout: Two level;Bed/bath upstairs Home Equipment: None      Prior Function Level of Independence: Independent               Hand Dominance   Dominant Hand: Right    Extremity/Trunk Assessment   Upper Extremity Assessment: Overall WFL for tasks assessed (except decreased coordination with pron/sup)           Lower Extremity Assessment: RLE deficits/detail;LLE deficits/detail RLE Deficits / Details: AROM and strength WFL, coordination deficits noted with heel to shin LLE Deficits / Details: AROM and strength WFL, coordination deficits noted with heel to shin     Communication   Communication: No difficulties  Cognition Arousal/Alertness: Awake/alert Behavior During Therapy: WFL for tasks assessed/performed Overall Cognitive Status: Within Functional Limits for tasks assessed                      General Comments      Exercises        Assessment/Plan    PT Assessment Patient needs continued PT services  PT Diagnosis Abnormality of gait   PT Problem List Decreased balance;Decreased mobility;Decreased coordination  PT Treatment Interventions DME instruction;Stair training;Gait training;Functional mobility training;Patient/family education;Therapeutic activities   PT Goals (Current goals can be found in the Care Plan section) Acute Rehab PT Goals Patient Stated Goal: To return to independent PT Goal Formulation: With patient Time For Goal Achievement: 03/27/15 Potential to Achieve Goals: Good    Frequency Min 4X/week   Barriers to discharge        Co-evaluation               End of Session Equipment Utilized During Treatment: Gait belt Activity Tolerance: Patient tolerated treatment well Patient left: in chair      Functional Assessment Tool Used: Clinical Judgement Functional Limitation: Mobility: Walking and moving around Mobility: Walking and Moving  Around Current Status [id number]): At least 1 percent but less than 20 percent impaired, limited or restricted Mobility: Walking and Moving Around Goal Status [phone]): At least 1 percent but less than 20 percent impaired, limited or restricted    Time: 0932-1000 PT Time Calculation (min) (ACUTE ONLY): 28 min   Charges:   PT Evaluation $Initial PT Evaluation Tier I: 1 Procedure PT Treatments $Gait Training: 8-22 mins   PT G Codes:   PT G-Codes **NOT FOR INPATIENT CLASS** Functional Assessment Tool Used: Clinical Judgement Functional Limitation: Mobility: Walking and moving around Mobility: Walking and Moving Around Current Status [id number]): At least 1 percent but less than 20 percent impaired, limited or restricted Mobility: Walking and Moving Around Goal Status [phone]): At least 1 percent but less than 20 percent impaired, limited or restricted    [facility] 03/20/2015, 11:48 AM  Magda Kiel, PT [phone] 03/20/2015

## 2015-03-20 NOTE — Progress Notes (Signed)
VASCULAR LAB PRELIMINARY  PRELIMINARY  PRELIMINARY  PRELIMINARY  Carotid duplex completed.    Preliminary report:  Bilateral:  1-39% ICA stenosis.  Vertebral artery flow is antegrade.     Kylie Calhoun, RVS 03/20/2015, 12:44 PM

## 2015-03-21 ENCOUNTER — Other Ambulatory Visit: Payer: Self-pay | Admitting: Neurology

## 2015-03-21 ENCOUNTER — Inpatient Hospital Stay (HOSPITAL_COMMUNITY): Payer: Medicare Other

## 2015-03-21 ENCOUNTER — Other Ambulatory Visit (HOSPITAL_COMMUNITY): Payer: Medicare Other

## 2015-03-21 DIAGNOSIS — I639 Cerebral infarction, unspecified: Principal | ICD-10-CM

## 2015-03-21 DIAGNOSIS — I6789 Other cerebrovascular disease: Secondary | ICD-10-CM

## 2015-03-21 DIAGNOSIS — C679 Malignant neoplasm of bladder, unspecified: Secondary | ICD-10-CM

## 2015-03-21 DIAGNOSIS — E785 Hyperlipidemia, unspecified: Secondary | ICD-10-CM

## 2015-03-21 MED ORDER — ATORVASTATIN CALCIUM 10 MG PO TABS: 20.0000 mg | ORAL_TABLET | Freq: Every day | ORAL | Status: DC

## 2015-03-21 MED ORDER — CLOPIDOGREL BISULFATE 75 MG PO TABS: 75.0000 mg | ORAL_TABLET | Freq: Every day | ORAL | Status: DC

## 2015-03-21 MED ORDER — ASPIRIN 325 MG PO TBEC: 325.0000 mg | DELAYED_RELEASE_TABLET | Freq: Every day | ORAL | Status: DC

## 2015-03-21 NOTE — Progress Notes (Signed)
Called pt and asked her to pick up plavix in pharmacy and take from tomorrow and also stop ASA. She expressed understanding and appreciation.   Rosalin Hawking, MD PhD Stroke Neurology 03/21/2015 7:05 PM

## 2015-03-21 NOTE — Progress Notes (Signed)
STROKE TEAM PROGRESS NOTE   SUBJECTIVE (INTERVAL HISTORY) Patient reports she has had unsteady gait prior to admission, feels she is at baseline. Is  Open to do therapy as an outpatient.   OBJECTIVE Temp:  [97.5 F (36.4 C)-98 F (36.7 C)] 97.8 F (36.6 C) (12/07 0505) Pulse Rate:  [63-75] 63 (12/07 0505) Cardiac Rhythm:  [-] Normal sinus rhythm (12/06 1900) Resp:  [16] 16 (12/07 0505) BP: (116-139)/(68-78) 121/75 mmHg (12/07 0505) SpO2:  [97 %-100 %] 97 % (12/07 0505)  CBC:   Recent Labs Lab 03/19/15 0450 03/19/15 0509  WBC 7.1  --   NEUTROABS 2.9  --   HGB 14.6 16.3*  HCT 44.8 48.0*  MCV 90.0  --   PLT 252  --     Basic Metabolic Panel:   Recent Labs Lab 03/19/15 0450 03/19/15 0509  NA 140 143  K 3.7 3.6  CL 108 107  CO2 22  --   GLUCOSE 111* 106*  BUN 13 13  CREATININE 1.03* 1.00  CALCIUM 9.0  --     Lipid Panel:     Component Value Date/Time   CHOL 192 03/20/2015 0656   TRIG 131 03/20/2015 0656   HDL 47 03/20/2015 0656   CHOLHDL 4.1 03/20/2015 0656   VLDL 26 03/20/2015 0656   LDLCALC 119* 03/20/2015 0656   HgbA1c:  Lab Results  Component Value Date   HGBA1C 5.9* 03/19/2015   Urine Drug Screen:     Component Value Date/Time   LABOPIA NONE DETECTED 03/19/2015 0532   COCAINSCRNUR NONE DETECTED 03/19/2015 0532   LABBENZ NONE DETECTED 03/19/2015 0532   AMPHETMU NONE DETECTED 03/19/2015 0532   THCU NONE DETECTED 03/19/2015 0532   LABBARB NONE DETECTED 03/19/2015 0532      IMAGING  Dg Chest 2 View 03/19/2015  No active cardiopulmonary disease.  Mild thoracic scoliosis.   Ct Head Wo Contrast 03/19/2015  No acute intracranial pathology. Age-related atrophy and chronic microvascular ischemic disease. 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 Clinical correlation is recommended.   Mri & Mra Brain Wo Contrast 03/19/2015  1. 5 mm left thalamic infarct, likely early subacute. 2. Ventricular enlargement favored to reflect moderate cerebral atrophy over  hydrocephalus. 3. Mild chronic small vessel ischemic disease. 4. No major intracranial arterial occlusion. 5. Moderate proximal left P2 PCA stenosis.   Carotid Doppler   Bilateral: 1-39% ICA stenosis. Vertebral artery flow is antegrade.  2D Echocardiogram  - Left ventricle: The cavity size was normal. Wall thickness wasnormal. Systolic function was normal. The estimated ejectionfraction was in the range of 60% to 65%. Wall motion was normal;there were no regional wall motion abnormalities. Dopplerparameters more consistent with abnormal left ventricularrelaxation (grade 1 diastolic dysfunction).   PHYSICAL EXAM General - young appearing for age, Well nourished, well developed, in no apparent distress.  Cardiovascular - Regular rate and rhythm.  Mental Status -  Level of arousal and orientation to time, place, and person were intact. Language including expression, naming, repetition, comprehension was assessed and found intact. Attention span and concentration were normal.  Motor Strength - The patient's strength was normal in all extremities and pronator drift was absent.  Bulk was normal and fasciculations were absent.   Motor Tone - Muscle tone was normal.  Sensory - intact to light touch symmetrical.    Coordination - The patient had normal movements in the hands and feet with no ataxia or dysmetria.  Tremor was absent.  Gait - no ataxia. Slight shuffling. Neg Rhomberg. Cannot balance  on one foot without assist, L more unsteady than R. Can walk on heels and toes.   ASSESSMENT/PLAN Ms. Kylie Calhoun is a 64 y.o. female with history of bladder cancer presenting with right sided numbness face-arm-leg. She did not receive IV t-PA due to delay in arrival.   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 No significant risk factor, so recommend switch to plavix for stroke prevention.   Patient counseled to be compliant with her antithrombotic medications  Ongoing aggressive stroke risk factor management  Therapy recommendations:  OP PT   Continue weekly exercise  Disposition:  d/c home w/ OP PT  Follow up with neuro NP in one month at stroke clinic at [facility]. Order written.   Hyperglycemia  CBG 106, 111  HgbA1c 5.9  Hyperlipidemia  LDL 119   No statin PTA  Now on lipitor 10 mg daily, goal LDL < 70   Dietary education, low fat diet addressed  Continue statin on discharge  Other Stroke Risk Factors  Advanced age  ETOH use  Family hx stroke (sister, brother)  Other Active Problems  Hx bladder cancer with 2 recurrences, currently "cured" as per pt  Osteopenia  Hgb 16.3  Hospital day # Jenner Regan for Pager information 03/21/2015 9:03 AM   I, the attending vascular neurologist, have personally obtained a history, examined the patient, evaluated laboratory data, individually viewed imaging studies and agree with radiology interpretations. Together with the NP/PA, we formulated the assessment and plan of care which reflects our mutual decision.  I have made any additions or clarifications directly to the above note and agree with the findings and plan as currently documented.    Rosalin Hawking, MD PhD Stroke Neurology 03/21/2015 11:52 PM  To contact Stroke Continuity provider, please refer to [email or web address] After hours, contact General Neurology

## 2015-03-21 NOTE — Progress Notes (Signed)
Discharged via w/c to home. All discharged instructions reviewed and [facility] ordered

## 2015-03-21 NOTE — Progress Notes (Signed)
  Echocardiogram 2D Echocardiogram has been performed.  Kylie Calhoun 03/21/2015, 8:52 AM

## 2015-03-21 NOTE — Discharge Summary (Signed)
Physician Discharge Summary  Kylie Calhoun [id number] DOB: 03/04/1951 DOA: 03/19/2015  PCP: Marton Redwood, MD  Admit date: 03/19/2015 Discharge date: 03/21/2015  Time spent: 35 minutes  Recommendations for Outpatient Follow-up:  1. Follow-up with neurology in 2-4 weeks 2. Go home  with physical therapy.     Discharge Diagnoses:  Principal Problem:   Ischemic stroke [facility]) Active Problems:   TIA (transient ischemic attack)   Hx of osteopenia   Bladder cancer (HCC)   Unsteady gait   Malignant neoplasm of urinary bladder (HCC)   Stroke ([place])   Stroke with cerebral ischemia (HCC)   HLD (hyperlipidemia)   Discharge Condition: stable  Diet recommendation: heart healthy  Filed Weights   03/19/15 1000  Weight: 65.772 kg (145 lb)    History of present illness:  64 year old with past medical history of bladder cancer and osteopenia who presents to the hospital for difficulty walking that started when she woke up at 3 AM in the morning she describes her right leg is dragging and numbness sensation.  Hospital Course:  Left thalamic lacunar infarct: MRI of the brain was done that showed left thalamic infarct, 2-D echo showed an EF of 65%, carotid Doppler showed less than 40% bilateral carotid stenosis, LDL was 119 she was started on Lipitor, hemoglobin A1c was less than 6. Physical therapy evaluated the patient and recommended outpatient PT. Neurology was consulted and recommended to increase her aspirin to 325 daily.   Hyperlipidemia: LDL not ago she was started on Lipitor 20 mg daily check LFTs as an outpatient in 6 weeks recheck fasting lipid panel in 6 months.   Procedures:  MRI brain 12.5  MRA 12.5  CT head 12.4  Echo 12.7  Carotid doppler 12.6  Consultations:  neurology  Discharge Exam: Filed Vitals:   03/21/15 0151 03/21/15 0505  BP: 131/73 121/75  Pulse: 74 63  Temp: 97.8 F (36.6 C) 97.8 F (36.6 C)  Resp: 16 16    General: A&O  x3 Cardiovascular: RRR Respiratory: good air movement CTA B/L  Discharge Instructions   Discharge Instructions    Ambulatory referral to Physical Therapy    Complete by:  As directed      Diet - low sodium heart healthy    Complete by:  As directed      Increase activity slowly    Complete by:  As directed           Current Discharge Medication List    START taking these medications   Details  aspirin EC 325 MG EC tablet Take 1 tablet (325 mg total) by mouth daily. Qty: 30 tablet, Refills: 0    atorvastatin (LIPITOR) 10 MG tablet Take 2 tablets (20 mg total) by mouth daily at 6 PM. Qty: 30 tablet, Refills: 0      CONTINUE these medications which have NOT CHANGED   Details  CALCIUM PO Take 2 tablets by mouth daily.    cetirizine (ZYRTEC) 10 MG tablet Take 5 mg by mouth daily.    cholecalciferol (VITAMIN D) 1000 UNITS tablet Take 2,000 Units by mouth daily.       No Known Allergies Follow-up Information    Follow up with [facility].   Why:  [facility] will contact you to set up an appointment. If you have not heard from them in several days you may contact them at number provided.    Contact information:   [place]  The results of significant diagnostics from this hospitalization (including imaging, microbiology, ancillary and laboratory) are listed below for reference.    Significant Diagnostic Studies: Dg Chest 2 View  03/19/2015  CLINICAL DATA:  TIA, right side weakness EXAM: CHEST  2 VIEW COMPARISON:  None. FINDINGS: Cardiomediastinal silhouette is unremarkable. No acute infiltrate or pleural effusion. No pulmonary edema. Mild dextroscoliosis mid thoracic spine. Mild levoscoliosis lower thoracic spine. IMPRESSION: No active cardiopulmonary disease.  Mild thoracic scoliosis. Electronically Signed   By: Lahoma Crocker M.D.   On: 03/19/2015 12:09   Ct Head Wo Contrast  03/19/2015  CLINICAL DATA:   64 year old female with right-sided numbness EXAM: CT HEAD WITHOUT CONTRAST TECHNIQUE: Contiguous axial images were obtained from the base of the skull through the vertex without intravenous contrast. COMPARISON:  None. FINDINGS: There is dilatation of the ventricles out of proportion with the sulci which may represent central volume loss versus normal pressure hydrocephalus. Clinical correlation is recommended. Periventricular and deep white matter hypodensities represent chronic microvascular ischemic changes. There is no intracranial hemorrhage. No mass effect or midline shift identified. The visualized paranasal sinuses and mastoid air cells are well aerated. The calvarium is intact. IMPRESSION: No acute intracranial pathology. Age-related atrophy and chronic microvascular ischemic disease. 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 Clinical correlation is recommended. If symptoms persist and there are no contraindications, MRI may provide better evaluation if clinically indicated. Electronically Signed   By: Anner Crete M.D.   On: 03/19/2015 05:11   Mr Jodene Nam Head Wo Contrast  03/19/2015  CLINICAL DATA:  New onset numbness in the right face, arm, and leg. History of bladder cancer. EXAM: MRI HEAD WITHOUT CONTRAST MRA HEAD WITHOUT CONTRAST TECHNIQUE: Multiplanar, multiecho pulse sequences of the brain and surrounding structures were obtained without intravenous contrast. Angiographic images of the head were obtained using MRA technique without contrast. COMPARISON:  Head CT 03/19/2015 FINDINGS: MRI HEAD FINDINGS There is a 5 mm likely early subacute infarct involving the lateral left thalamus. There is no evidence of intracranial hemorrhage, mass, midline shift, or extra-axial fluid collection. There is moderate enlargement of the lateral and third ventricles which is slightly greater than the degree of sulcal enlargement. Periventricular white matter T2 hyperintensities are nonspecific but compatible with mild chronic  small vessel ischemic disease. Chronic ischemic changes are also noted in the right thalamus. Orbits are unremarkable. A small mucous retention cyst is noted in a posterior left ethmoid air cell. There is a trace right mastoid effusion. Major intracranial vascular flow voids are preserved. MRA HEAD FINDINGS The visualized distal vertebral arteries are patent without stenosis. The right vertebral artery is hypoplastic distal to the PICA origin. SCA origins are patent. Basilar artery is patent without stenosis. There are left larger than right posterior communicating arteries with the left P1 segment being hypoplastic. There is a moderate proximal left P2 stenosis, and there is asymmetric attenuation of PCA branch vessels on the right. Internal carotid arteries are patent from skullbase to carotid termini without evidence of significant stenosis. ACAs and MCAs are patent without evidence of significant proximal stenosis or major branch vessel occlusion. There is mild MCA branch vessel irregularity bilaterally. No intracranial aneurysm is identified. IMPRESSION: 1. 5 mm left thalamic infarct, likely early subacute. 2. Ventricular enlargement favored to reflect moderate cerebral atrophy over hydrocephalus. 3. Mild chronic small vessel ischemic disease. 4. No major intracranial arterial occlusion. 5. Moderate proximal left P2 PCA stenosis. Electronically Signed   By: Logan Bores M.D.   On: 03/19/2015 07:46  Mr Brain Wo Contrast  03/19/2015  CLINICAL DATA:  New onset numbness in the right face, arm, and leg. History of bladder cancer. EXAM: MRI HEAD WITHOUT CONTRAST MRA HEAD WITHOUT CONTRAST TECHNIQUE: Multiplanar, multiecho pulse sequences of the brain and surrounding structures were obtained without intravenous contrast. Angiographic images of the head were obtained using MRA technique without contrast. COMPARISON:  Head CT 03/19/2015 FINDINGS: MRI HEAD FINDINGS There is a 5 mm likely early subacute infarct  involving the lateral left thalamus. There is no evidence of intracranial hemorrhage, mass, midline shift, or extra-axial fluid collection. There is moderate enlargement of the lateral and third ventricles which is slightly greater than the degree of sulcal enlargement. Periventricular white matter T2 hyperintensities are nonspecific but compatible with mild chronic small vessel ischemic disease. Chronic ischemic changes are also noted in the right thalamus. Orbits are unremarkable. A small mucous retention cyst is noted in a posterior left ethmoid air cell. There is a trace right mastoid effusion. Major intracranial vascular flow voids are preserved. MRA HEAD FINDINGS The visualized distal vertebral arteries are patent without stenosis. The right vertebral artery is hypoplastic distal to the PICA origin. SCA origins are patent. Basilar artery is patent without stenosis. There are left larger than right posterior communicating arteries with the left P1 segment being hypoplastic. There is a moderate proximal left P2 stenosis, and there is asymmetric attenuation of PCA branch vessels on the right. Internal carotid arteries are patent from skullbase to carotid termini without evidence of significant stenosis. ACAs and MCAs are patent without evidence of significant proximal stenosis or major branch vessel occlusion. There is mild MCA branch vessel irregularity bilaterally. No intracranial aneurysm is identified. IMPRESSION: 1. 5 mm left thalamic infarct, likely early subacute. 2. Ventricular enlargement favored to reflect moderate cerebral atrophy over hydrocephalus. 3. Mild chronic small vessel ischemic disease. 4. No major intracranial arterial occlusion. 5. Moderate proximal left P2 PCA stenosis. Electronically Signed   By: Logan Bores M.D.   On: 03/19/2015 07:46    Microbiology: No results found for this or any previous visit (from the past 240 hour(s)).   Labs: Basic Metabolic Panel:  Recent Labs Lab  03/19/15 0450 03/19/15 0509  NA 140 143  K 3.7 3.6  CL 108 107  CO2 22  --   GLUCOSE 111* 106*  BUN 13 13  CREATININE 1.03* 1.00  CALCIUM 9.0  --    Liver Function Tests:  Recent Labs Lab 03/19/15 0450  AST 24  ALT 21  ALKPHOS 61  BILITOT 1.3*  PROT 6.4*  ALBUMIN 4.1   No results for input(s): LIPASE, AMYLASE in the last 168 hours. No results for input(s): AMMONIA in the last 168 hours. CBC:  Recent Labs Lab 03/19/15 0450 03/19/15 0509  WBC 7.1  --   NEUTROABS 2.9  --   HGB 14.6 16.3*  HCT 44.8 48.0*  MCV 90.0  --   PLT 252  --    Cardiac Enzymes: No results for input(s): CKTOTAL, CKMB, CKMBINDEX, TROPONINI in the last 168 hours. BNP: BNP (last 3 results) No results for input(s): BNP in the last 8760 hours.  ProBNP (last 3 results) No results for input(s): PROBNP in the last 8760 hours.  CBG: No results for input(s): GLUCAP in the last 168 hours.   Signed:  Charlynne Cousins  [facility] 03/21/2015, 11:20 AM

## 2015-03-21 NOTE — Care Management Note (Signed)
Case Management Note  Patient Details  Name: Tanganika Barradas MRN: [id number] Date of Birth: 04-20-1950  Subjective/Objective:                    Action/Plan: Plan is for patient to discharge home today with self care and to follow up with outpatient PT. CM met with the patient and her son and patient is interested in attending the [facility]. CM placed in EPIC and placed information on the AVS. Bedside RN updated.   Expected Discharge Date:                  Expected Discharge Plan:  Home/Self Care  In-House Referral:     Discharge planning Services  CM Consult  Post Acute Care Choice:    Choice offered to:     DME Arranged:    DME Agency:     HH Arranged:    [place] Agency:     Status of Service:  Completed, signed off  Medicare Important Message Given:    Date Medicare IM Given:    Medicare IM give by:    Date Additional Medicare IM Given:    Additional Medicare Important Message give by:     If discussed at [place] of Stay Meetings, dates discussed:    Additional Comments:  Pollie Friar, RN 03/21/2015, 10:09 AM

## 2015-03-24 ENCOUNTER — Emergency Department (HOSPITAL_COMMUNITY): Payer: Medicare Other

## 2015-03-24 ENCOUNTER — Encounter (HOSPITAL_COMMUNITY): Payer: Self-pay | Admitting: Vascular Surgery

## 2015-03-24 ENCOUNTER — Emergency Department (HOSPITAL_COMMUNITY)
Admission: EM | Admit: 2015-03-24 | Discharge: 2015-03-24 | Disposition: A | Discharge status: Home / Self Care | Payer: Medicare Other | Attending: Physician Assistant | Admitting: Physician Assistant

## 2015-03-24 DIAGNOSIS — R200 Anesthesia of skin: Secondary | ICD-10-CM | POA: Diagnosis not present

## 2015-03-24 DIAGNOSIS — Z8551 Personal history of malignant neoplasm of bladder: Secondary | ICD-10-CM | POA: Insufficient documentation

## 2015-03-24 DIAGNOSIS — R202 Paresthesia of skin: Secondary | ICD-10-CM | POA: Diagnosis not present

## 2015-03-24 DIAGNOSIS — I639 Cerebral infarction, unspecified: Secondary | ICD-10-CM | POA: Diagnosis not present

## 2015-03-24 DIAGNOSIS — Z8739 Personal history of other diseases of the musculoskeletal system and connective tissue: Secondary | ICD-10-CM | POA: Insufficient documentation

## 2015-03-24 DIAGNOSIS — Z79899 Other long term (current) drug therapy: Secondary | ICD-10-CM | POA: Insufficient documentation

## 2015-03-24 DIAGNOSIS — Z8673 Personal history of transient ischemic attack (TIA), and cerebral infarction without residual deficits: Secondary | ICD-10-CM | POA: Diagnosis not present

## 2015-03-24 DIAGNOSIS — Z7902 Long term (current) use of antithrombotics/antiplatelets: Secondary | ICD-10-CM | POA: Diagnosis not present

## 2015-03-24 HISTORY — DX: Cerebral infarction, unspecified: I63.9

## 2015-03-24 LAB — URINALYSIS, ROUTINE W REFLEX MICROSCOPIC
BILIRUBIN URINE: NEGATIVE
Glucose, UA: NEGATIVE mg/dL
KETONES UR: NEGATIVE mg/dL
NITRITE: NEGATIVE
PH: 6 (ref 5.0–8.0)
Protein, ur: NEGATIVE mg/dL
Specific Gravity, Urine: 1.005 (ref 1.005–1.030)

## 2015-03-24 LAB — CBC
HCT: 44.3 % (ref 36.0–46.0)
HEMOGLOBIN: 14.8 g/dL (ref 12.0–15.0)
MCH: 29.9 pg (ref 26.0–34.0)
MCHC: 33.4 g/dL (ref 30.0–36.0)
MCV: 89.5 fL (ref 78.0–100.0)
Platelets: 263 10*3/uL (ref 150–400)
RBC: 4.95 MIL/uL (ref 3.87–5.11)
RDW: 13.2 % (ref 11.5–15.5)
WBC: 7.1 10*3/uL (ref 4.0–10.5)

## 2015-03-24 LAB — COMPREHENSIVE METABOLIC PANEL
ALBUMIN: 4.2 g/dL (ref 3.5–5.0)
ALK PHOS: 60 U/L (ref 38–126)
ALT: 51 U/L (ref 14–54)
AST: 50 U/L — AB (ref 15–41)
Anion gap: 10 (ref 5–15)
BILIRUBIN TOTAL: 1.5 mg/dL — AB (ref 0.3–1.2)
BUN: 12 mg/dL (ref 6–20)
CALCIUM: 9.8 mg/dL (ref 8.9–10.3)
CO2: 24 mmol/L (ref 22–32)
Chloride: 105 mmol/L (ref 101–111)
Creatinine, Ser: 0.98 mg/dL (ref 0.44–1.00)
GFR calc Af Amer: >60 mL/min (ref >60)
GFR calc non Af Amer: 59 mL/min — ABNORMAL LOW (ref >60)
GLUCOSE: 89 mg/dL (ref 65–99)
POTASSIUM: 4.2 mmol/L (ref 3.5–5.1)
SODIUM: 139 mmol/L (ref 135–145)
Total Protein: 6.7 g/dL (ref 6.5–8.1)

## 2015-03-24 LAB — DIFFERENTIAL
BASOS ABS: 0 10*3/uL (ref 0.0–0.1)
Basophils Relative: 0 %
Eosinophils Absolute: 0 10*3/uL (ref 0.0–0.7)
Eosinophils Relative: 1 %
LYMPHS ABS: 2.9 10*3/uL (ref 0.7–4.0)
LYMPHS PCT: 40 %
Monocytes Absolute: 0.9 10*3/uL (ref 0.1–1.0)
Monocytes Relative: 12 %
NEUTROS PCT: 47 %
Neutro Abs: 3.4 10*3/uL (ref 1.7–7.7)

## 2015-03-24 LAB — I-STAT CHEM 8, ED
BUN: 13 mg/dL (ref 6–20)
CHLORIDE: 105 mmol/L (ref 101–111)
CREATININE: 0.9 mg/dL (ref 0.44–1.00)
Calcium, Ion: 1.15 mmol/L (ref 1.13–1.30)
Glucose, Bld: 83 mg/dL (ref 65–99)
HEMATOCRIT: 48 % — AB (ref 36.0–46.0)
Hemoglobin: 16.3 g/dL — ABNORMAL HIGH (ref 12.0–15.0)
POTASSIUM: 4 mmol/L (ref 3.5–5.1)
Sodium: 140 mmol/L (ref 135–145)
TCO2: 25 mmol/L (ref 0–100)

## 2015-03-24 LAB — RAPID URINE DRUG SCREEN, HOSP PERFORMED
AMPHETAMINES: NOT DETECTED
Barbiturates: NOT DETECTED
Benzodiazepines: NOT DETECTED
Cocaine: NOT DETECTED
OPIATES: NOT DETECTED
TETRAHYDROCANNABINOL: NOT DETECTED

## 2015-03-24 LAB — URINE MICROSCOPIC-ADD ON

## 2015-03-24 LAB — APTT: APTT: 29 s (ref 24–37)

## 2015-03-24 LAB — I-STAT TROPONIN, ED: Troponin i, poc: 0 ng/mL (ref 0.00–0.08)

## 2015-03-24 LAB — PROTIME-INR
INR: 1.06 (ref 0.00–1.49)
Prothrombin Time: 14 seconds (ref 11.6–15.2)

## 2015-03-24 LAB — ETHANOL: Alcohol, Ethyl (B): <5 mg/dL (ref <5)

## 2015-03-24 MED ORDER — CEPHALEXIN 250 MG PO CAPS
500.0000 mg | ORAL_CAPSULE | Freq: Once | ORAL | Status: AC
  Administered 2015-03-24: 500 mg via ORAL
  Filled 2015-03-24: qty 2

## 2015-03-24 MED ORDER — CEPHALEXIN 500 MG PO CAPS: 500.0000 mg | ORAL_CAPSULE | Freq: Four times a day (QID) | ORAL | Status: DC

## 2015-03-24 MED ORDER — LORAZEPAM 1 MG PO TABS
0.5000 mg | ORAL_TABLET | Freq: Once | ORAL | Status: AC
  Administered 2015-03-24: 0.5 mg via ORAL
  Filled 2015-03-24: qty 1

## 2015-03-24 MED ORDER — ASPIRIN 81 MG PO CHEW
81.0000 mg | CHEWABLE_TABLET | Freq: Once | ORAL | Status: AC
  Administered 2015-03-24: 81 mg via ORAL
  Filled 2015-03-24: qty 1

## 2015-03-24 NOTE — Discharge Instructions (Signed)
You should start to take DIALY 81 mg of asprin.  You also have a UTI and should take the perscribed antibiotics to help.

## 2015-03-24 NOTE — Consult Note (Signed)
Neurology Consultation Reason for Consult: Right sided numbness Referring Physician: Thomasene Lot, C  CC: Right-sided numbness  History is obtained from: Patient  HPI: Kylie Calhoun is a 64 y.o. female with recent stroke on Monday who presents with re-worsening of her right-sided numbness. She had these symptoms on Monday, but they improved and that she was discharged home on Wednesday. She did have an MRI confirmed stroke. She presents today with re-worsening of the same symptoms that she had initially.  She denies any additional symptoms other than numbness, and denies any difference between the symptoms and the ones that occurred on Monday.   LKW: Monday tpa given?: no, recent stroke on Monday    ROS: A 14 point ROS was performed and is negative except as noted in the HPI.   Past Medical History  Diagnosis Date  . Bladder cancer ([place]) 2011    3 times  . Hx of osteopenia   . Diverticulosis     mild per colonoscopy  . Hemorrhoid     internal per colonoscopy 2016  . Stroke [facility])      Family History  Problem Relation Age of Onset  . Heart disease Father   . Stroke Sister   . Hypertension Sister   . Stroke Brother   . Hypertension Brother      Social History:  reports that she has never smoked. She has never used smokeless tobacco. She reports that she drinks about 0.6 oz of alcohol per week. She reports that she does not use illicit drugs.   Exam: Current vital signs: BP 126/77 mmHg  Pulse 62  Temp(Src) 97.4 F (36.3 C) (Oral)  Resp 15  SpO2 100% Vital signs in last 24 hours: Temp:  [97.4 F (36.3 C)] 97.4 F (36.3 C) (12/10 1228) Pulse Rate:  [62-73] 62 (12/10 1215) Resp:  [15-16] 15 (12/10 1215) BP: (126-158)/(77-83) 126/77 mmHg (12/10 1215) SpO2:  [100 %] 100 % (12/10 1215)  Physical Exam  Constitutional: Appears well-developed and well-nourished.  Psych: Affect appropriate to situation Eyes: No scleral injection HENT: No OP obstrucion Head:  Normocephalic.  Cardiovascular: Normal rate and regular rhythm.  Respiratory: Effort normal and breath sounds normal to anterior ascultation GI: Soft.  No distension. There is no tenderness.  Skin: WDI  Neuro: Mental Status: Patient is awake, alert, oriented to person, place, month, year, and situation. Patient is able to give a clear and coherent history. No signs of aphasia or neglect Cranial Nerves: II: Visual Fields are full. Pupils are equal, round, and reactive to light.   III,IV, VI: EOMI without ptosis or diploplia.  V: Facial sensation is symmetric to temperature VII: Facial movement is symmetric.  VIII: hearing is intact to voice X: Uvula elevates symmetrically XI: Shoulder shrug is symmetric. XII: tongue is midline without atrophy or fasciculations.  Motor: Tone is normal. Bulk is normal. 5/5 strength was present in all four extremities.  Sensory: Sensation is symmetric to light touch and pinprick. She complains of right-sided numbness but states that to sensation, things feel the same Cerebellar: FNF and HKS are intact bilaterally   I have reviewed labs in epic and the results pertinent to this consultation are: UA-concerning for urinary tract infection  I have reviewed the images obtained: MRI brain-small thalamic infarct  Impression: 64 year old female with re-worsening of her symptoms that were present on Monday with the previous stroke. My suspicion is that this either represents slight extension or just recrudescence of symptoms of her previous stroke.  One thing  of note, if her urinalysis does indicate a urinary tract infection, this can cause recrudescence of symptoms of a previous stroke and may be what we are dealing with here.  Also possible is that sometimes symptoms of small lacunar strokes do wax and wane. There is some thought that goal antiplatelet therapy for 3 weeks may be beneficial and it would be reasonable to do aspirin plus Plavix for 3 weeks  followed by Plavix monotherapy given the worsening of her symptoms.  He will she has a clearly separate event, I'm not sure that bring her back in the hospital would be of much benefit to her  Recommendations: 1) MRI brain, if no separate event from her previous then would favor aspirin to Plavix dual therapy for 3 weeks followed by Plavix monotherapy 2) if there is a clearly new lesion, then may have to consider other workup.   Roland Rack, MD [facility] [phone]  If 7pm- 7am, please page neurology on call as listed in [place].

## 2015-03-24 NOTE — ED Notes (Signed)
Pt reports to the ED for eval of right sided forearm numbness and right leg numbness. LSN 1030. She had a stroke last week on MRI and was admitted. Pt ambulatory without difficulty. MAE well. She denies any HA or CP. She has been taking her plavix and statin this week. Neurologist at bedside at this time. NIH 0 at this time. She is A&OX4, resp e/u, and skin warm and dry.

## 2015-03-24 NOTE — ED Provider Notes (Signed)
CSN: [id number]     Arrival date & time 03/24/15  1131 History   First MD Initiated Contact with Patient 03/24/15 1145     Chief Complaint  Patient presents with  . Numbness     (Consider location/radiation/quality/duration/timing/severity/associated sxs/prior Treatment) HPI  Patient is a very pleasant 64 year old female presenting with numbness to the right side of her body that started 1 hour prior to arrival. Patient had recent admission for thalamic stroke for similar symptoms. She was discharged on Plavix after having an inpatient hospitalization and followed by Dr. Sherrian Divers from neurology. Patient reports she was in her usual state of health since Tuesday when she was discharged but this morning developed numbness to her right arm mild numbness in her right leg and presented here to the emergency department with her daughter.  Patient had no systemic symptoms such as fever nausea vomiting diarrhea or cold symptoms.  Patient called hercode stroke on arrival.  Past Medical History  Diagnosis Date  . Bladder cancer ([place]) 2011    3 times  . Hx of osteopenia   . Diverticulosis     mild per colonoscopy  . Hemorrhoid     internal per colonoscopy 2016  . Stroke [facility])    Past Surgical History  Procedure Laterality Date  . 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 Tonsillectomy    . Abdominal hysterectomy    . Appendectomy    . Bladder surgery      scope and removal of tumors   Family History  Problem Relation Age of Onset  . Heart disease Father   . Stroke Sister   . Hypertension Sister   . Stroke Brother   . Hypertension Brother    Social History  Substance Use Topics  . Smoking status: Never Smoker   . Smokeless tobacco: Never Used  . Alcohol Use: 0.6 oz/week    1 Glasses of wine per week     Comment: Occasional    OB History    Gravida Para Term Preterm AB TAB SAB Ectopic Multiple Living   3              Review of Systems  Constitutional:  Negative for activity change.  HENT: Negative for congestion.   Respiratory: Negative for shortness of breath.   Cardiovascular: Negative for chest pain.  Gastrointestinal: Negative for nausea, vomiting and abdominal pain.  Neurological: Positive for numbness. Negative for weakness.      Allergies  Review of patient's allergies indicates no known allergies.  Home Medications   Prior to Admission medications   Medication Sig Start Date End Date Taking? Authorizing Provider  atorvastatin (LIPITOR) 20 MG tablet Take 1 tablet by mouth daily at 6 PM.  03/21/15  Yes Historical Provider, MD  Calcium Citrate-Vitamin D (CALCIUM + D PO) Take 2 tablets by mouth daily.   Yes Historical Provider, MD  cetirizine (ZYRTEC) 10 MG tablet Take 5 mg by mouth daily as needed for allergies.    Yes Historical Provider, MD  cholecalciferol (VITAMIN D) 1000 UNITS tablet Take 2,000 Units by mouth daily.   Yes Historical Provider, MD  clopidogrel (PLAVIX) 75 MG tablet Take 1 tablet (75 mg total) by mouth daily. 03/21/15  Yes Rosalin Hawking, MD  cephALEXin (KEFLEX) 500 MG capsule Take 1 capsule (500 mg total) by mouth 4 (four) times daily. 03/24/15   Devetta Hagenow Lyn Alissah Redmon, MD   BP 135/78 mmHg  Pulse 68  Temp(Src) 97.4  F (36.3 C) (Oral)  Resp 17  SpO2 98% Physical Exam  Constitutional: She is oriented to person, place, and time. She appears well-developed and well-nourished.  HENT:  Head: Normocephalic and atraumatic.  Eyes: Conjunctivae are normal. Right eye exhibits no discharge.  Neck: Neck supple.  Cardiovascular: Normal rate, regular rhythm and normal heart sounds.   No murmur heard. Pulmonary/Chest: Effort normal and breath sounds normal. She has no wheezes. She has no rales.  Abdominal: Soft. She exhibits no distension. There is no tenderness.  Musculoskeletal: Normal range of motion. She exhibits no edema.  Neurological: She is oriented to person, place, and time. No cranial nerve deficit.  Numbness  reported to right hand. Otherwise no pronator drift, no weakness, no cranial nerve deficit, no posterior circulation deficits.  Skin: Skin is warm and dry. No rash noted. She is not diaphoretic.  Psychiatric: She has a normal mood and affect. Her behavior is normal.  Nursing note and vitals reviewed.   ED Course  Procedures (including critical care time) Labs Review Labs Reviewed  COMPREHENSIVE METABOLIC PANEL - Abnormal; Notable for the following:    AST 50 (*)    Total Bilirubin 1.5 (*)    GFR calc non Af Amer 59 (*)    All other components within normal limits  URINALYSIS, ROUTINE W REFLEX MICROSCOPIC (NOT AT [facility]) - Abnormal; Notable for the following:    APPearance CLOUDY (*)    Hgb urine dipstick TRACE (*)    Leukocytes, UA LARGE (*)    All other components within normal limits  URINE MICROSCOPIC-ADD ON - Abnormal; Notable for the following:    Squamous Epithelial / LPF 0-5 (*)    Bacteria, UA RARE (*)    All other components within normal limits  I-STAT CHEM 8, ED - Abnormal; Notable for the following:    Hemoglobin 16.3 (*)    HCT 48.0 (*)    All other components within normal limits  ETHANOL  PROTIME-INR  APTT  CBC  DIFFERENTIAL  URINE RAPID DRUG SCREEN, HOSP PERFORMED  I-STAT TROPOININ, ED    Imaging Review Ct Head Wo Contrast  03/24/2015  CLINICAL DATA:  Right side numbness, recent stroke EXAM: CT HEAD WITHOUT CONTRAST TECHNIQUE: Contiguous axial images were obtained from the base of the skull through the vertex without intravenous contrast. COMPARISON:  03/19/2015 FINDINGS: No skull fracture is noted. No intracranial hemorrhage, mass effect or midline shift. Stable cerebral atrophy. Stable periventricular and patchy subcortical chronic white matter disease. No definite acute cortical infarction. No mass lesion is noted on this unenhanced scan. Stable subtle area of decreased attenuation in left thalamus probable recent subacute thalamic infarct. IMPRESSION: No  intracranial hemorrhage, mass effect or midline shift. Stable cerebral atrophy. Stable periventricular and patchy subcortical chronic white matter disease. No definite acute cortical infarction.Stable subtle area of decreased attenuation in left thalamus probable recent subacute thalamic infarct. These results were called by telephone at the time of interpretation on 03/24/2015 at 12:03 pm to Dr. Leonel Ramsay , who verbally acknowledged these results. Electronically Signed   By: Lahoma Crocker M.D.   On: 03/24/2015 12:04   Mr Brain Wo Contrast  03/24/2015  CLINICAL DATA:  64 year old female with right extremity numbness since 1030 hours. Left thalamic lacunar infarct last week. Subsequent encounter. EXAM: MRI HEAD WITHOUT CONTRAST TECHNIQUE: Multiplanar, multiecho pulse sequences of the brain and surrounding structures were obtained without intravenous contrast. COMPARISON:  Head CT without contrast 1151 hours today. Brain MRI and MRA head 03/19/2015.  FINDINGS: Major intracranial vascular flow voids are stable. Expected evolution of the left lateral thalamus lacunar infarct, no extension. Diffusion there a now appears primarily facilitated. No new restricted diffusion or evidence of new infarction. Stable cerebral volume. Stable colpocephaly. Patchy and confluent bilateral cerebral white matter T2 and FLAIR hyperintensity is stable. Patchy T2 hyperintensity in the pons and cerebellum is stable. Mild T2 heterogeneity in the right thalamus is stable. No chronic cerebral blood products. No midline shift, mass effect, evidence of mass lesion, ventriculomegaly, extra-axial collection or acute intracranial hemorrhage. Cervicomedullary junction and pituitary are within normal limits. Negative visualized cervical spine. Visible internal auditory structures appear normal. Stable paranasal sinuses, mastoids, orbit soft tissues, scalp soft tissues. Visualized bone marrow signal is within normal limits. IMPRESSION: 1. Expected  evolution of left thalamic lacunar infarct since 03/19/2015. No hemorrhage or mass effect. 2. No new intracranial abnormality. Electronically Signed   By: Genevie Ann M.D.   On: 03/24/2015 14:02   I have personally reviewed and evaluated these images and lab results as part of my medical decision-making.   EKG Interpretation   Date/Time:  Saturday March 24 2015 11:59:56 EST Ventricular Rate:  61 PR Interval:  119 QRS Duration: 87 QT Interval:  403 QTC Calculation: 406 R Axis:   48 Text Interpretation:  Sinus rhythm Borderline short PR interval no acute  ischemia No significant change since last tracing Confirmed by Gerald Leitz ([id number]) on 03/24/2015 12:21:40 PM      MDM   Final diagnoses:  Numbness and tingling    Patient is a very friendly 64 year old female presented with right-sided numbness. This is in the setting of recent thalamic stroke presenting with same symptoms. She is on Plavix at home. Patient called code stroke. Neurology do bedside. CAT scan ordered.  12:03 PM CT is negative for large bleed. We'll order an MRI. If this stroke is similar in size and location as previously we will just add on aspirin. If it is completely different stroke we'll consider inpatient admission at that time.  2:51 PM MRI shows normal evolution.  Discussed with neuro. Will start on baby asprin and treat UTI.  (possibly UTI induced revisitation of stroke.   Patient with normal vitals, eating, ambulatory in no acute distress at discharge.   Allyse Fregeau Julio Alm, MD 03/24/15 1451

## 2015-03-24 NOTE — ED Notes (Signed)
Pt is in stable condition upon d/c and ambulates from ED. 

## 2015-03-29 ENCOUNTER — Ambulatory Visit: Payer: Medicare Other | Attending: Internal Medicine | Admitting: Physical Therapy

## 2015-03-29 ENCOUNTER — Encounter: Payer: Self-pay | Admitting: Physical Therapy

## 2015-03-29 DIAGNOSIS — R531 Weakness: Secondary | ICD-10-CM | POA: Insufficient documentation

## 2015-03-29 DIAGNOSIS — R2681 Unsteadiness on feet: Secondary | ICD-10-CM

## 2015-03-29 DIAGNOSIS — R269 Unspecified abnormalities of gait and mobility: Secondary | ICD-10-CM

## 2015-03-29 NOTE — Therapy (Signed)
[facility] [address] [place], [place], [address] Phone: [phone]   Fax:  [phone]  Physical Therapy Evaluation  Patient Details  Name: Kylie Calhoun MRN: [id number] Date of Birth: 02-27-1951 Referring Provider: Dr. Rosalin Hawking (referred by hospitalist; pt requesting Dr. Erlinda Hong receive eval)  Encounter Date: 03/29/2015      PT End of Session - 03/29/15 1953    Visit Number 1   Number of Visits 9   Date for PT Re-Evaluation 05/28/15   Authorization Type UHC MCR - GCodes required   PT Start Time 1402   PT Stop Time [id number]   PT Time Calculation (min) 51 min   Activity Tolerance Patient tolerated treatment well   Behavior During Therapy [facility] for tasks assessed/performed      Past Medical History  Diagnosis Date  . Bladder cancer ([place]) 2011    3 times  . Hx of osteopenia   . Diverticulosis     mild per colonoscopy  . Hemorrhoid     internal per colonoscopy 2016  . Stroke [facility])     Past Surgical History  Procedure Laterality Date  . 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 Tonsillectomy    . Abdominal hysterectomy    . Appendectomy    . Bladder surgery      scope and removal of tumors    There were no vitals filed for this visit.  Visit Diagnosis:  Abnormality of gait - Plan: PT plan of care cert/re-cert  Unsteadiness - Plan: PT plan of care cert/re-cert  Generalized weakness - Plan: PT plan of care cert/re-cert      Subjective Assessment - 03/29/15 1413    Subjective Pt sustained CVA (L thalamic lacunar infarct) on 03/19/15 and was hospitalized from 12/5 - 03/21/15. Since sustaining CVA, pt reports, "My balance is off, I'm weaker, I'm tired, and I'm wobbly. I feel like I need a nap after everything I do."   Pertinent History PMH: CVA 03/19/15; bladder cancer with recurrence x2 (currently in remission).   Patient Stated Goals "Just not to be wobbly."   Currently in Pain? No/denies             [facility] PT Assessment - 03/29/15 0001    Assessment   Medical Diagnosis stroke with cerebral ischemia   Referring Provider Dr. Rosalin Hawking  referred by hospitalist; pt requesting Dr. Erlinda Hong receive eval   Onset Date/Surgical Date 03/19/15   Hand Dominance Right   Precautions   Precautions Fall   Restrictions   Weight Bearing Restrictions No   Balance Screen   Has the patient fallen in the past 6 months No   Has the patient had a decrease in activity level because of a fear of falling?  Yes   Is the patient reluctant to leave their home because of a fear of falling?  No   Home Environment   Living Environment Private residence   Living Arrangements Alone   Available Help at Discharge Neighbor   Type of Home Other(Comment)  [place] to enter   Entrance Stairs-Number of Steps 1  3 thresholds total   Entrance Stairs-Rails None   Home Layout Two level   Alternate Level Stairs-Number of Steps 13   Alternate Level Stairs-Rails Left   Prior Function   Level of Independence Independent   Vocation Retired   Leisure [employer]; aerobics; church [employer]  Overall Cognitive Status Within Functional Limits for tasks assessed   Sensation   Light Touch Appears Intact   Coordination   Gross Motor Movements are Fluid and Coordinated Yes   Heel Shin Test Excursion and quality of movement grossly WNL and symmetrical bilaterally.   ROM / Strength   AROM / PROM / Strength Strength   Strength   Overall Strength Deficits   Overall Strength Comments R knee extension 4/5   Transfers   Transfers Sit to Stand;Stand to Sit   Sit to Stand 7: Independent   Five time sit to stand comments  11.94 seconds   Stand to Sit 7: Independent   Ambulation/Gait   Ambulation/Gait Yes   Ambulation/Gait Assistance 5: Supervision;6: Modified independent (Device/Increase time)   Ambulation/Gait Assistance Details Mod I for linear gait without dual tasking, head  turns, or obstacle negotiation; supervision required during dynamic gait activities (see FGA below).   Ambulation Distance (Feet) 450 Feet   Assistive device None   Ambulation Surface Level;Indoor   Gait velocity 3.39 ft/sec   Stairs Yes   Stairs Assistance 6: Modified independent (Device/Increase time);5: Supervision   Stairs Assistance Details (indicate cue type and reason) Mod I to ascend; supervision to descend. Slow and cautious during descent.   Stair Management Technique Two rails;Alternating pattern;Forwards   Number of Stairs 4   Height of Stairs 6   Ramp 6: Modified independent (Device)   Curb 5: Supervision   Functional Gait  Assessment   Gait assessed  Yes   Gait Level Surface Walks 20 ft in less than 7 sec but greater than 5.5 sec, uses assistive device, slower speed, mild gait deviations, or deviates 6-10 in outside of the 12 in walkway width.  gait speed < 5.5 sec but mild gait deviations   Change in Gait Speed Able to smoothly change walking speed without loss of balance or gait deviation. Deviate no more than 6 in outside of the 12 in walkway width.   Gait with Horizontal Head Turns Performs head turns with moderate changes in gait velocity, slows down, deviates 10-15 in outside 12 in walkway width but recovers, can continue to walk.  veers to L side with head turn to L side   Gait with Vertical Head Turns Performs task with slight change in gait velocity (eg, minor disruption to smooth gait path), deviates 6 - 10 in outside 12 in walkway width or uses assistive device   Gait and Pivot Turn Pivot turns safely within 3 sec and stops quickly with no loss of balance.   Step Over Obstacle Is able to step over one shoe box (4.5 in total height) without changing gait speed. No evidence of imbalance.   Gait with Narrow Base of Support Ambulates less than 4 steps heel to toe or cannot perform without assistance.   Gait with Eyes Closed Cannot walk 20 ft without assistance, severe  gait deviations or imbalance, deviates greater than 15 in outside 12 in walkway width or will not attempt task.  deviates > 15" outside walkway   Ambulating Backwards Walks 20 ft, slow speed, abnormal gait pattern, evidence for imbalance, deviates 10-15 in outside 12 in walkway width.   Steps Alternating feet, must use rail.   Total Score 16   FGA comment: FGA scores < 19 indicates high fall risk.                   [facility] Adult PT Treatment/Exercise - 03/29/15 0001    Ambulation/Gait  Gait Pattern Decreased arm swing - right;Step-through pattern;Decreased dorsiflexion - right;Decreased trunk rotation;Narrow base of support                PT Education - 03/29/15 1946    Education provided Yes   Education Details PT eval findings, goals, and POC.   Person(s) Educated Patient   Methods Explanation   Comprehension Verbalized understanding          PT Short Term Goals - 03/29/15 1954    PT SHORT TERM GOAL #1   Title Pt will perform initial HEP with mod I using paper handout to maximize functional gains made in PT. Target date: 04/26/15   PT SHORT TERM GOAL #2   Title Pt will improve FGA score from 16/30 to 20/30 to indicate improved dynamic gait stability. Target date: 04/26/15   PT SHORT TERM GOAL #3   Title Pt will independently negotiate standard curb step without AD to indicate ability to safely use primary entrance of home. Target date: 04/26/15   PT SHORT TERM GOAL #4   Title Pt will independently negotiate 13 stairs with single L rail to enable pt to safely access second floor of home. Target date: 04/26/15   PT SHORT TERM GOAL #5   Title Pt will verbalize understanding of CVA warning signs, pertinent risk factors to prevent future CVA. Target date: 04/26/15           PT Long Term Goals - 03/29/15 2000    PT LONG TERM GOAL #1   Title Pt will improve FGA score to > / = 23/30 to indicate decreased risk of falling. Target date: 05/24/15   PT LONG TERM GOAL #2    Title Pt will independently negotiate standard curb step without rail with item in one hand with no overt LOB to indicate pt ability to safely carry groceries into home. Target date: 05/24/15    PT LONG TERM GOAL #3   Title Pt will independently ambulate 1,000'' over unlevel, paved surfaces to return to PLOF as independent community mobility. Target date: 05/24/15   PT LONG TERM GOAL #4   Title Pt will independently traverse standard ramps, door sills, and surface changes to indicate safety negotiation community obstacles. Target date: 05/24/15   PT LONG TERM GOAL #5   Title Pt will attend 30-minute aeorbics class without limitation to indicate safe return to commnuity fitness. Target date: 05/24/15   Additional Long Term Goals   Additional Long Term Goals Yes   PT LONG TERM GOAL #6   Title Pt will independently negotiate 4 stairs without rails (to simulate church chior loft) to enable pt to participate in church chior. Target dte: 05/24/15               Plan - 03/29/15 2010    Clinical Impression Statement Pt is a 64 y/o F referred to outpatient neuro PT to address functional impairments associated with CVA sustained 03/19/15. Pt hospitalized 12/5 - 03/21/15. PT evaluation reveals the following impairments: decreased stability/independence with functional mobility; gait impairments; FGA score indicative of decreased dynamic gait stability and  high fall risk. Recommending 2x/week for 6 weeks; however, pt requesting 1x/week due to high insurance co-pay. Therefore, Pt will benefit from skilled outpatient PT 1x/week for 8 weeks to address said impairments..   Pt will benefit from skilled therapeutic intervention in order to improve on the following deficits Abnormal gait;Decreased balance;Decreased strength;Postural dysfunction;Decreased endurance   Rehab Potential Good   Clinical Impairments Affecting Rehab Potential  financial limitations (high insurance co-pay)   PT Frequency 1x / week   Recommending 2x/week for 6 weeks; however, pt requesting 1x/week due to high co-pay   PT Duration 8 weeks   PT Treatment/Interventions ADLs/Self Care Home Management;Electrical Stimulation;Canalith Repostioning;Gait training;Stair training;Functional mobility training;Therapeutic activities;Therapeutic exercise;Balance training;Orthotic Fit/Training;Patient/family education;Neuromuscular re-education;Manual techniques;Vestibular   PT Next Visit Plan Assess for vestibular impairments (visual-vestibular integration impairment; VOR); initiate HEP with focus on dynamic gait stability, stair negotiation.   Consulted and Agree with Plan of Care Patient          G-Codes - 04-11-2015 1952    Functional Assessment Tool Used FGA 16/30   Functional Limitation Mobility: Walking and moving around   Mobility: Walking and Moving Around Current Status [phone]) At least 40 percent but less than 60 percent impaired, limited or restricted   Mobility: Walking and Moving Around Goal Status [phone]) At least 20 percent but less than 40 percent impaired, limited or restricted       Problem List Patient Active Problem List   Diagnosis Date Noted  . Stroke ([place]) 03/20/2015  . Stroke with cerebral ischemia ([place])   . HLD (hyperlipidemia)   . TIA (transient ischemic attack) 03/19/2015  . Unsteady gait 03/19/2015  . Hx of osteopenia   . Bladder cancer ([place])   . Hemorrhoid   . Diverticulosis   . Ischemic stroke ([place])   . 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 [place], [place], [address] Phone: [phone]   Fax:  [phone] 04/11/15, 8:19 PM  Name: Kylie Calhoun MRN: [id number] Date of Birth: 1950-12-19

## 2015-04-05 ENCOUNTER — Ambulatory Visit: Payer: Medicare Other | Admitting: Physical Therapy

## 2015-04-05 DIAGNOSIS — R269 Unspecified abnormalities of gait and mobility: Secondary | ICD-10-CM | POA: Diagnosis not present

## 2015-04-05 DIAGNOSIS — R2681 Unsteadiness on feet: Secondary | ICD-10-CM

## 2015-04-05 DIAGNOSIS — R531 Weakness: Secondary | ICD-10-CM

## 2015-04-05 NOTE — Therapy (Signed)
[facility] [address] [place], [place], [address] Phone: [phone]   Fax:  [phone]  Physical Therapy Treatment  Patient Details  Name: Kylie Calhoun MRN: [id number] Date of Birth: [birth year] Referring Provider: Dr. Rosalin Hawking (referred by hospitalist; pt requesting Dr. Erlinda Hong receive eval)  Encounter Date: 04/05/2015      PT End of Session - 04/05/15 1234    Visit Number 2   Number of Visits 9   Date for PT Re-Evaluation 05/28/15   Authorization Type UHC MCR - GCodes required   PT Start Time 0846   PT Stop Time 0939   PT Time Calculation (min) 53 min   Equipment Utilized During Treatment Gait belt   Activity Tolerance Patient tolerated treatment well   Behavior During Therapy [facility] for tasks assessed/performed      Past Medical History  Diagnosis Date  . Bladder cancer ([place]) 2011    3 times  . Hx of osteopenia   . Diverticulosis     mild per colonoscopy  . Hemorrhoid     internal per colonoscopy 2016  . Stroke [facility])     Past Surgical History  Procedure Laterality Date  . 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 Tonsillectomy    . Abdominal hysterectomy    . Appendectomy    . Bladder surgery      scope and removal of tumors    There were no vitals filed for this visit.  Visit Diagnosis:  Abnormality of gait  Unsteadiness  Generalized weakness      Subjective Assessment - 04/05/15 0849    Subjective Pt continues to report "endurance is not good; I'm getting really tired after each thing I do, I have to take a break and take a cap nap." Pt reports no falls.    Pertinent History PMH: CVA 03/19/15; bladder cancer with recurrence x2 (currently in remission).   Patient Stated Goals "Just not to be wobbly."   Currently in Pain? No/denies            [facility] PT Assessment - 04/05/15 0001    Strength   Overall Strength Deficits   Overall Strength Comments Upon further  testing of B hip strength, noted hip extension 3-/5 on L, 4-/5 on R;  R hip ABD 4+/5, L hip ABD 4-/5.            Vestibular Assessment - 04/05/15 0001    Vestibular Assessment   General Observation --   Occulomotor Exam   Occulomotor Alignment Normal   Spontaneous Absent   Gaze-induced Absent   Smooth Pursuits Intact   Saccades Intact  Mild overshooting horizontal from R > midline   Comment Unable to fully assess Head Thrust Test due to cervical spine muscle guarding.   Vestibulo-Occular Reflex   VOR 1 Head Only (x 1 viewing) Minimal difficulty with vertical head turns. Pt exhibits difficulty coordinating horizontal head turns during VOR.   VOR Cancellation Normal                 [facility] Adult PT Treatment/Exercise - 04/05/15 0001    Neuro Re-ed    Neuro Re-ed Details  With verbal/demo cueing from PT, pt performed standin horizontal head turns x20 with cueing for coordination of movement. See Balance Exercises section and Pt Instructions for further detail on home balance exercises.   Exercises   Exercises Other Exercises   Other Exercises  With verbal instruction,  demo cueing, and tactile cueing from PT, pt effectively performed supine bridging x15 reps and L clamshells x5 reps then x10 reps; see Pt Instructions for details.              Balance Exercises - 04/05/15 0907    Balance Exercises: Standing   Standing Eyes Closed Wide (BOA);Head turns;Foam/compliant surface;Other (comment)  pillow x1; incresaed disequilibrium after 10 head turns   SLS Eyes open;Solid surface;5 reps;15 secs   Tandem Gait Forward;Upper extremity support;5 reps;Other (comment)  5 reps x10'           PT Education - 04/05/15 1223    Education provided Yes   Education Details Initiated HEP; see Pt Instructions.   Person(s) Educated Patient   Methods Explanation;Demonstration;Tactile cues;Verbal cues;Handout   Comprehension Returned demonstration;Verbalized understanding           PT Short Term Goals - 03/29/15 1954    PT SHORT TERM GOAL #1   Title Pt will perform initial HEP with mod I using paper handout to maximize functional gains made in PT. Target date: 04/26/15   PT SHORT TERM GOAL #2   Title Pt will improve FGA score from 16/30 to 20/30 to indicate improved dynamic gait stability. Target date: 04/26/15   PT SHORT TERM GOAL #3   Title Pt will independently negotiate standard curb step without AD to indicate ability to safely use primary entrance of home. Target date: 04/26/15   PT SHORT TERM GOAL #4   Title Pt will independently negotiate 13 stairs with single L rail to enable pt to safely access second floor of home. Target date: 04/26/15   PT SHORT TERM GOAL #5   Title Pt will verbalize understanding of CVA warning signs, pertinent risk factors to prevent future CVA. Target date: 04/26/15           PT Long Term Goals - 03/29/15 2000    PT LONG TERM GOAL #1   Title Pt will improve FGA score to > / = 23/30 to indicate decreased risk of falling. Target date: 05/24/15   PT LONG TERM GOAL #2   Title Pt will independently negotiate standard curb step without rail with item in one hand with no overt LOB to indicate pt ability to safely carry groceries into home. Target date: 05/24/15    PT LONG TERM GOAL #3   Title Pt will independently ambulate 1,000'' over unlevel, paved surfaces to return to PLOF as independent community mobility. Target date: 05/24/15   PT LONG TERM GOAL #4   Title Pt will independently traverse standard ramps, door sills, and surface changes to indicate safety negotiation community obstacles. Target date: 05/24/15   PT LONG TERM GOAL #5   Title Pt will attend 30-minute aeorbics class without limitation to indicate safe return to commnuity fitness. Target date: 05/24/15   Additional Long Term Goals   Additional Long Term Goals Yes   PT LONG TERM GOAL #6   Title Pt will independently negotiate 4 stairs without rails (to simulate church chior loft) to  enable pt to participate in church chior. Target dte: 05/24/15               Plan - 04/05/15 1235    Clinical Impression Statement Skilled session focused on initiating HEP and further assessment of impact of visual/vestibular input on dynamic standing balance. No significant visual/vestibular impairments noted, although VOR not fully assessed due to muscle guarding, pt difficulty coordinating horizontal head turns during standing. Also noted hip  extensor/abductor weakness (on L > R LE). Pt very motivated and engaged throughout session.   Pt will benefit from skilled therapeutic intervention in order to improve on the following deficits Abnormal gait;Decreased balance;Decreased strength;Postural dysfunction;Decreased endurance   Rehab Potential Good   PT Frequency 1x / week   PT Duration 8 weeks   PT Treatment/Interventions ADLs/Self Care Home Management;Electrical Stimulation;Canalith Repostioning;Gait training;Stair training;Functional mobility training;Therapeutic activities;Therapeutic exercise;Balance training;Orthotic Fit/Training;Patient/family education;Neuromuscular re-education;Manual techniques;Vestibular   PT Next Visit Plan Check HEP and progress, as appropriate. Begin working toward goals.   Consulted and Agree with Plan of Care Patient        Problem List Patient Active Problem List   Diagnosis Date Noted  . Stroke ([place]) 03/20/2015  . Stroke with cerebral ischemia ([place])   . HLD (hyperlipidemia)   . TIA (transient ischemic attack) 03/19/2015  . Unsteady gait 03/19/2015  . Hx of osteopenia   . Bladder cancer ([place])   . Hemorrhoid   . Diverticulosis   . Ischemic stroke ([place])   . Malignant neoplasm of urinary bladder ([place])     Billie Ruddy, PT, DPT [facility] [address] [place], [place], [address] Phone: [phone]   Fax:  [phone] 04/05/2015, 12:46 PM    Name: Kylie Calhoun MRN: [id number] Date of Birth:  10/03/1950

## 2015-04-05 NOTE — Patient Instructions (Addendum)
Feet Apart (Compliant Surface) Head Motion - Eyes Closed    Stand with your back to a corner with a stable chair in front of you. Stand on 1 pillow with feet shoulder width apart. Close eyes and move head slowly, up and down 10 times; from right to left 10 times. Repeat __2-3__ times per day.  Single limb stance    While still standing with back to corner with stable chair in front of you (as in first exercise), stand on one leg, trying not to hold onto chair unless needed. Try to hold 15 seconds on each leg. Perform 5 times on each, 2-3 times per day.    Head Motion: Side to Side    Standing next to countertop but try not to hold on unless you feel off-balance. Slowly move head from right to left side (as if shaking head "no") 20 times, twice per day. Try not let your shoulders move with your head.  Feet Heel-Toe "Tandem"    Stand with one hand on countertop.  Walk a straight line bringing one foot directly in front of the other. Repeat for __3__ minutes per session. Do __2__ sessions per day.   Abduction: Clam (Eccentric) - Side-Lying    Lie on RIGHT side with knees bent. Hold onto the edge of your bed with your top (left) hand. Lift LEFT knee, keeping feet together. Keep trunk steady - *Don't let top hip roll backwards. Slowly lower for 3-5 seconds. You should feel your "back pocket" muscle working. Perform 8 reps, 3 times per day with the LEFT leg. As this gets easier, increase by 2-3 repetitions, as tolerated.  Bracing With Bridging (Hook-Lying)    With neutral spine, tighten pelvic floor and abdominals and hold. Lift bottom. Repeat 15 times. Perform 3 sets per day.

## 2015-04-10 ENCOUNTER — Ambulatory Visit: Payer: Medicare Other | Admitting: Physical Therapy

## 2015-04-24 ENCOUNTER — Ambulatory Visit: Payer: Medicare Other | Admitting: Physical Therapy

## 2015-04-26 ENCOUNTER — Ambulatory Visit: Payer: Self-pay | Admitting: Neurology

## 2015-04-27 ENCOUNTER — Ambulatory Visit: Payer: Medicare Other | Attending: Internal Medicine | Admitting: Physical Therapy

## 2015-04-27 DIAGNOSIS — R2681 Unsteadiness on feet: Secondary | ICD-10-CM | POA: Diagnosis present

## 2015-04-27 DIAGNOSIS — R269 Unspecified abnormalities of gait and mobility: Secondary | ICD-10-CM | POA: Diagnosis present

## 2015-04-27 DIAGNOSIS — R531 Weakness: Secondary | ICD-10-CM

## 2015-04-27 NOTE — Patient Instructions (Addendum)
Feet Apart (Compliant Surface) Head Motion - Eyes Closed    Stand with your back to a corner with a stable chair in front of you. Stand on 2 pillows with feet shoulder width apart.   First: Close eyes and move head slowly, up and down 10 times; from right to left 10 times. Then: Open eyes and move head slowly diagonally right/up to left/down 10 times; then diagonally left/up to right/down 10 times. Repeat __2-3__ times per day.  Single limb stance    While still standing with back to corner with stable chair in front of you (as in first exercise), stand on one leg, trying not to hold onto chair unless needed. Try to hold 15 seconds on each leg. Perform 5 times on each, 2-3 times per day. * Try this exercise with your full length mirror in front of you.    Feet Heel-Toe "Tandem"    Stand with one hand on countertop. Walk a straight line bringing one foot directly in front of the other. *Progress this exercise by decreasing support from entire hand to 2-3 finger tip support. Repeat for __3__ minutes per session. Do __2__ sessions per day.   Abduction: Clam (Eccentric) - Side-Lying    Lie on RIGHT side with knees bent. Hold onto the edge of your bed with your top (left) hand. Lift LEFT knee, keeping feet together. Keep trunk steady - *Don't let top hip roll backwards. Slowly lower for 3-5 seconds. You should feel your "back pocket" muscle working. Perform 10 reps, 3 times per day with the LEFT leg. As this gets easier, increase by 2-3 repetitions, as tolerated.  Bracing With Bridging (Hook-Lying)    With neutral spine, tighten pelvic floor and abdominals and hold. Lift bottom. Repeat 20 times. Perform 3 sets per day.     Copyright  VHI. All rights reserved.  Gaze Stabilization: Tip Card 1.Target must remain in focus, not blurry, and appear stationary while head is in motion. 2.Perform exercises with small head movements (45 to either side of midline). 3.Increase speed  of head motion so long as target is in focus. 4.If you wear eyeglasses, be sure you can see target through lens (therapist will give specific instructions for bifocal / progressive lenses). 5.These exercises may provoke dizziness or nausea. Work through these symptoms. If too dizzy, slow head movement slightly. Rest between each exercise. 6.Exercises demand concentration; avoid distractions. 7.For safety, perform standing exercises close to a counter, wall, corner, or next to someone.  Copyright  VHI. All rights reserved.  Gaze Stabilization: Standing Feet Apart   Feet shoulder width apart, keeping eyes on target on wall 3 feet away, tilt head down slightly and move head side to side for 30 seconds. Repeat while moving head up and down for 30 seconds. Do 2 sessions per day.

## 2015-04-28 NOTE — Therapy (Signed)
[facility] [address] [place], [place], [address] Phone: [phone]   Fax:  [phone]  Physical Therapy Treatment  Patient Details  Name: Kylie Calhoun MRN: [id number] Date of Birth: April 23, 1950 Referring Provider: Dr. Rosalin Hawking (referred by hospitalist; pt requesting Dr. Erlinda Hong receive eval)  Encounter Date: 04/27/2015      PT End of Session - 04/28/15 1518    Visit Number 3   Number of Visits 9   Date for PT Re-Evaluation 05/28/15   Authorization Type UHC MCR - GCodes required   PT Start Time 1017   PT Stop Time 1106   PT Time Calculation (min) 49 min   Equipment Utilized During Treatment Gait belt   Activity Tolerance Patient tolerated treatment well   Behavior During Therapy [facility] for tasks assessed/performed      Past Medical History  Diagnosis Date  . Bladder cancer ([place]) 2011    3 times  . Hx of osteopenia   . Diverticulosis     mild per colonoscopy  . Hemorrhoid     internal per colonoscopy 2016  . Stroke [facility])     Past Surgical History  Procedure Laterality Date  . 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 Tonsillectomy    . Abdominal hysterectomy    . Appendectomy    . Bladder surgery      scope and removal of tumors    There were no vitals filed for this visit.  Visit Diagnosis:  Abnormality of gait  Unsteadiness  Generalized weakness      Subjective Assessment - 04/27/15 1020    Subjective "I probably haven't been doing my PT homework as much because I've had recurrent urinary tract infections. I actually walked yesterday; and I'm planning to go back to church this weekend and sing in the chior.."   Pertinent History PMH: CVA 03/19/15; bladder cancer with recurrence x2 (currently in remission).   Patient Stated Goals "Just not to be wobbly."   Currently in Pain? No/denies                         [facility] Adult PT Treatment/Exercise - 04/28/15 0001     Transfers   Transfers Sit to Stand;Stand to Sit   Sit to Stand 7: Independent   Five time sit to stand comments  --   Stand to Sit 7: Independent   Ambulation/Gait   Ambulation/Gait Yes   Ambulation/Gait Assistance 5: Supervision;6: Modified independent (Device/Increase time)   Ambulation Distance (Feet) 250 Feet   Assistive device None   Gait Pattern Decreased arm swing - right;Step-through pattern;Decreased dorsiflexion - right;Decreased trunk rotation;Narrow base of support   Gait velocity --   Stairs Yes   Stairs Assistance 7: Independent   Stair Management Technique Forwards;One rail Right;Alternating pattern   Number of Stairs 16  4 stairs x4 consecutive trials   Height of Stairs 6   Ramp 7: Independent   Curb 7: Independent   Self-Care   Self-Care Other Self-Care Comments   Other Self-Care Comments  Per pt questions about difference between TIA v.s CVA, engaged in interactive discussion and provided paper handout to reinforce CVA warning signs and importance of seeking medical attention immediately. Pt verbalized understanding.   Exercises   Exercises Other Exercises   Other Exercises  Pt performed home strengthening exercises (L clamshells, bridging) without cueing from PT. Progressed repetitions; see Pt Instructions for details.  Vestibular Treatment/Exercise - 04/28/15 0001    Vestibular Treatment/Exercise   Vestibular Treatment Provided Gaze   Gaze Exercises X1 Viewing Horizontal;X1 Viewing Vertical   X1 Viewing Horizontal   Foot Position standing; shoulder width   Comments x30 seconds   X1 Viewing Vertical   Foot Position standing; shoulder width   Comments x30 seconds            Balance Exercises - 04/28/15 1513    Balance Exercises: Standing   Standing Eyes Closed Wide (BOA);Head turns;Foam/compliant surface;Other (comment)  pillows x2   SLS Eyes open;Solid surface;5 reps;15 secs   Tandem Gait Forward;Upper extremity support;5 reps;Other  (comment);4 reps  4 reps x10'; progressed to finger tip support           PT Education - 04/28/15 1509    Education provided Yes   Education Details Progressed/modified HEP; see Pt Instructions.  STG's and progress; FGA score and functional implications.   Person(s) Educated Patient   Methods Explanation;Demonstration;Verbal cues;Handout   Comprehension Verbalized understanding;Returned demonstration          PT Short Term Goals - 04/27/15 1037    PT SHORT TERM GOAL #1   Title Pt will perform initial HEP with mod I using paper handout to maximize functional gains made in PT. Target date: 04/26/15   Baseline Met 1/13.   Status Achieved   PT SHORT TERM GOAL #2   Title Pt will improve FGA score from 16/30 to 20/30 to indicate improved dynamic gait stability. Target date: 04/26/15   Baseline 1/13:  FGA score = 21/30   Status Achieved   PT SHORT TERM GOAL #3   Title Pt will independently negotiate standard curb step without AD to indicate ability to safely use primary entrance of home. Target date: 04/26/15   Baseline Met 1/13.   Status Achieved   PT SHORT TERM GOAL #4   Title Pt will independently negotiate 13 stairs with single L rail to enable pt to safely access second floor of home. Target date: 04/26/15   Baseline Met 1/13.   Status Achieved   PT SHORT TERM GOAL #5   Title Pt will verbalize understanding of CVA warning signs, pertinent risk factors to prevent future CVA. Target date: 04/26/15   Baseline Met 1/13.   Status Achieved           PT Long Term Goals - 03/29/15 2000    PT LONG TERM GOAL #1   Title Pt will improve FGA score to > / = 23/30 to indicate decreased risk of falling. Target date: 05/24/15   PT LONG TERM GOAL #2   Title Pt will independently negotiate standard curb step without rail with item in one hand with no overt LOB to indicate pt ability to safely carry groceries into home. Target date: 05/24/15    PT LONG TERM GOAL #3   Title Pt will independently  ambulate 1,000'' over unlevel, paved surfaces to return to PLOF as independent community mobility. Target date: 05/24/15   PT LONG TERM GOAL #4   Title Pt will independently traverse standard ramps, door sills, and surface changes to indicate safety negotiation community obstacles. Target date: 05/24/15   PT LONG TERM GOAL #5   Title Pt will attend 30-minute aeorbics class without limitation to indicate safe return to commnuity fitness. Target date: 05/24/15   Additional Long Term Goals   Additional Long Term Goals Yes   PT LONG TERM GOAL #6   Title Pt will independently negotiate  4 stairs without rails (to simulate church chior loft) to enable pt to participate in church chior. Target dte: 05/24/15               Plan - 04/28/15 1520    Clinical Impression Statement Session focused on assessing functional progress and on progressing current HEP. Pt met 5 of 5 STG's, suggesting improved dynamic gait stability, improved stability/independence with functional mobility, and increased understanding of CVA warning signs/risk factors. Pt will continue to benefit from skilled outpatient PT to maximize safety wtih functional mobility and progress toward PLOF.   Pt will benefit from skilled therapeutic intervention in order to improve on the following deficits Abnormal gait;Decreased balance;Decreased strength;Postural dysfunction;Decreased endurance   Rehab Potential Good   Clinical Impairments Affecting Rehab Potential financial limitations (high insurance co-pay)   PT Frequency 1x / week   PT Duration 8 weeks   PT Treatment/Interventions ADLs/Self Care Home Management;Electrical Stimulation;Canalith Repostioning;Gait training;Stair training;Functional mobility training;Therapeutic activities;Therapeutic exercise;Balance training;Orthotic Fit/Training;Patient/family education;Neuromuscular re-education;Manual techniques;Vestibular   PT Next Visit Plan HEP: check and progress (especially gaze and hip  strengthening exercises). Assess outdoor mobility.   Consulted and Agree with Plan of Care Patient        Problem List Patient Active Problem List   Diagnosis Date Noted  . Stroke ([place]) 03/20/2015  . Stroke with cerebral ischemia ([place])   . HLD (hyperlipidemia)   . TIA (transient ischemic attack) 03/19/2015  . Unsteady gait 03/19/2015  . Hx of osteopenia   . Bladder cancer ([place])   . Hemorrhoid   . Diverticulosis   . Ischemic stroke ([place])   . Malignant neoplasm of urinary bladder ([place])     Billie Ruddy, PT, DPT [facility] [address] [place], [place], [address] Phone: [phone]   Fax:  [phone] 04/28/2015, 3:24 PM   Name: Kylie Calhoun MRN: [id number] Date of Birth: Aug 17, 1950

## 2015-05-02 ENCOUNTER — Ambulatory Visit: Payer: Medicare Other | Admitting: Physical Therapy

## 2015-05-04 ENCOUNTER — Ambulatory Visit: Payer: Medicare Other | Admitting: Physical Therapy

## 2015-05-04 DIAGNOSIS — R531 Weakness: Secondary | ICD-10-CM

## 2015-05-04 DIAGNOSIS — R269 Unspecified abnormalities of gait and mobility: Secondary | ICD-10-CM | POA: Diagnosis not present

## 2015-05-04 DIAGNOSIS — R2681 Unsteadiness on feet: Secondary | ICD-10-CM

## 2015-05-04 NOTE — Therapy (Signed)
[facility] [address] [place], [place], [address] Phone: [phone]   Fax:  [phone]  Physical Therapy Treatment  Patient Details  Name: Kylie Calhoun MRN: [id number] Date of Birth: June 21, 1950 Referring Provider: Dr. Rosalin Hawking (referred by hospitalist; pt requesting Dr. Erlinda Hong receive eval)  Encounter Date: 05/04/2015      PT End of Session - 05/04/15 1309    Visit Number 4   Number of Visits 9   Date for PT Re-Evaluation 05/28/15   Authorization Type UHC MCR - GCodes required   PT Start Time 1020   PT Stop Time 1105   PT Time Calculation (min) 45 min   Activity Tolerance Patient tolerated treatment well   Behavior During Therapy [facility] for tasks assessed/performed      Past Medical History  Diagnosis Date  . Bladder cancer ([place]) 2011    3 times  . Hx of osteopenia   . Diverticulosis     mild per colonoscopy  . Hemorrhoid     internal per colonoscopy 2016  . Stroke [facility])     Past Surgical History  Procedure Laterality Date  . 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 Tonsillectomy    . Abdominal hysterectomy    . Appendectomy    . Bladder surgery      scope and removal of tumors    There were no vitals filed for this visit.  Visit Diagnosis:  Abnormality of gait  Unsteadiness  Generalized weakness      Subjective Assessment - 05/04/15 1021    Subjective "I'm feeling stronger. I saw my GP recently and he changed my diet from that high-protein drink to Ensure; and I feel like my energy isn't bottoming out now.I'm actually able stand up to put on my pants now."   Pertinent History PMH: CVA 03/19/15; bladder cancer with recurrence x2 (currently in remission).   Patient Stated Goals "Just not to be wobbly."   Currently in Pain? No/denies            [facility] PT Assessment - 05/04/15 0001    Flexibility   Soft Tissue Assessment /Muscle Length yes   Quadriceps Thomas Test  reveals decreased extensiibility of B rectus femoris muscles.                     [facility] Adult PT Treatment/Exercise - 05/04/15 0001    Neuro Re-ed    Neuro Re-ed Details  Balance HEP progressed. With verbal/demo cueing f   Exercises   Exercises Other Exercises   Other Exercises  Progressed current LE strengthening exercises (clamshells and bridging); added Tband (for increased hip ABD activation) to bridging. Pt also performed squatting x15 reps without UE support with PT cueing for technique (decreased quad dominance). Also added B rectus femoris stretching 2 x60-sec holds per side. See Pt Instructions for details on all exercises, reps, sets, frequency, and duration.              Balance Exercises - 05/04/15 1307    Balance Exercises: Standing   Standing Eyes Closed Wide (BOA);Head turns;Foam/compliant surface;Other (comment)  1 pillow; horizontal, vert., and diagonal x10 each   SLS Eyes open;Solid surface;2 reps;10 secs   Tandem Gait Forward;5 reps;Other (comment);Intermittent upper extremity support  10' x2 reps             PT Short Term Goals - 04/27/15 1037    PT SHORT TERM GOAL #  1   Title Pt will perform initial HEP with mod I using paper handout to maximize functional gains made in PT. Target date: 04/26/15   Baseline Met 1/13.   Status Achieved   PT SHORT TERM GOAL #2   Title Pt will improve FGA score from 16/30 to 20/30 to indicate improved dynamic gait stability. Target date: 04/26/15   Baseline 1/13:  FGA score = 21/30   Status Achieved   PT SHORT TERM GOAL #3   Title Pt will independently negotiate standard curb step without AD to indicate ability to safely use primary entrance of home. Target date: 04/26/15   Baseline Met 1/13.   Status Achieved   PT SHORT TERM GOAL #4   Title Pt will independently negotiate 13 stairs with single L rail to enable pt to safely access second floor of home. Target date: 04/26/15   Baseline Met 1/13.   Status  Achieved   PT SHORT TERM GOAL #5   Title Pt will verbalize understanding of CVA warning signs, pertinent risk factors to prevent future CVA. Target date: 04/26/15   Baseline Met 1/13.   Status Achieved           PT Long Term Goals - 03/29/15 2000    PT LONG TERM GOAL #1   Title Pt will improve FGA score to > / = 23/30 to indicate decreased risk of falling. Target date: 05/24/15   PT LONG TERM GOAL #2   Title Pt will independently negotiate standard curb step without rail with item in one hand with no overt LOB to indicate pt ability to safely carry groceries into home. Target date: 05/24/15    PT LONG TERM GOAL #3   Title Pt will independently ambulate 1,000'' over unlevel, paved surfaces to return to PLOF as independent community mobility. Target date: 05/24/15   PT LONG TERM GOAL #4   Title Pt will independently traverse standard ramps, door sills, and surface changes to indicate safety negotiation community obstacles. Target date: 05/24/15   PT LONG TERM GOAL #5   Title Pt will attend 30-minute aeorbics class without limitation to indicate safe return to commnuity fitness. Target date: 05/24/15   Additional Long Term Goals   Additional Long Term Goals Yes   PT LONG TERM GOAL #6   Title Pt will independently negotiate 4 stairs without rails (to simulate church chior loft) to enable pt to participate in church chior. Target dte: 05/24/15               Plan - 05/04/15 1309    Clinical Impression Statement Session focused on progressing HEP with emphasis on standing balance, dynamic gait, and gaze stabilization. Pt notes she is now able to don pants in standing. Continue per POC.   Pt will benefit from skilled therapeutic intervention in order to improve on the following deficits Abnormal gait;Decreased balance;Decreased strength;Postural dysfunction;Decreased endurance   Rehab Potential Good   Clinical Impairments Affecting Rehab Potential financial limitations (high insurance co-pay)    PT Frequency 1x / week   PT Duration 8 weeks   PT Treatment/Interventions ADLs/Self Care Home Management;Electrical Stimulation;Canalith Repostioning;Gait training;Stair training;Functional mobility training;Therapeutic activities;Therapeutic exercise;Balance training;Orthotic Fit/Training;Patient/family education;Neuromuscular re-education;Manual techniques;Vestibular   PT Next Visit Plan HEP: check and progress (especially gaze and hip strengthening exercises). Assess outdoor mobility (if weather permits).   Consulted and Agree with Plan of Care Patient        Problem List Patient Active Problem List   Diagnosis Date Noted  .  Stroke ([place]) 03/20/2015  . Stroke with cerebral ischemia ([place])   . HLD (hyperlipidemia)   . TIA (transient ischemic attack) 03/19/2015  . Unsteady gait 03/19/2015  . Hx of osteopenia   . Bladder cancer ([place])   . Hemorrhoid   . Diverticulosis   . Ischemic stroke ([place])   . Malignant neoplasm of urinary bladder ([place])     Billie Ruddy, PT, DPT [facility] [address] [place], [place], [address] Phone: [phone]   Fax:  [phone] 05/04/2015, 2:12 PM   Name: Kylie Calhoun MRN: [id number] Date of Birth: 1950/08/23

## 2015-05-04 NOTE — Patient Instructions (Addendum)
Feet Apart (Compliant Surface) Head Motion - Eyes Closed    Stand with your back to a corner with a stable chair in front of you. Stand on 1 pillow with feet shoulder width apart.   First: Close eyes and move head slowly, up and down 10 times; from right to left 10 times. Then: Open eyes and move head slowly diagonally right/up to left/down 10 times; then diagonally left/up to right/down 10 times. Repeat __2-3__ times per day.  Single limb stance    While still standing with back to corner with stable chair in front of you (as in first exercise), stand on one leg, trying not to hold onto chair unless needed. Try to hold 15 seconds on each leg. Perform 5 times on each, 2-3 times per day. * Try this exercise with your full length mirror in front of you.  Feet Heel-Toe "Tandem"    Stand with one hand on countertop. Walk a straight line bringing one foot directly in front of the other. *Progress this exercise by decreasing support from entire hand to 2-3 finger tip support. Repeat for __3__ minutes per session. Do __2__ sessions per day.   Abduction: Clam (Eccentric) - Side-Lying    Lie on RIGHT side with knees bent. Hold onto the edge of your bed with your top (left) hand. Lift LEFT knee, keeping feet together. Keep trunk steady - *Don't let top hip roll backwards (It may help to reach toward the edge of the bed with your left arm).  Slowly lower for 3-5 seconds. You should feel your "back pocket" muscle working. Perform 12 reps, 3 times per day with the LEFT leg. As this gets easier, increase by 2-3 repetitions, as tolerated.   Abd    Lie on back and loop GREEN Theraband around legs, just above knees. Push out against band with legs, then lift hips to bridge. Hold for 1-2 seconds, then slowly lower to starting position. Perform 20 reps, 3 times per day.   Gaze Stabilization: Tip Card 1.Target must remain in focus, not blurry, and appear stationary while head is in  motion. 2.Perform exercises with small head movements (45 to either side of midline). 3.Increase speed of head motion so long as target is in focus. 4.If you wear eyeglasses, be sure you can see target through lens (therapist will give specific instructions for bifocal / progressive lenses). 5.These exercises may provoke dizziness or nausea. Work through these symptoms. If too dizzy, slow head movement slightly. Rest between each exercise. 6.Exercises demand concentration; avoid distractions. 7.For safety, perform standing exercises close to a counter, wall, corner, or next to someone.  Copyright  VHI. All rights reserved.  Gaze Stabilization: Standing Feet Apart   Feet shoulder width apart, keeping eyes on target on wall 5-6 feet away, tilt head down slightly and move head side to side for 30 repetitions. Repeat while moving head up and down for 30 repetitions. Do 2 sessions per day. If you find that you're consistently losing the target ("A") with your eyes, don't turn head quite as far.   HIP: Flexors - Supine    Lie on edge of surface (your couch). Place leg off the surface, allow knee to bend. Bring other knee toward chest. Hold _60__ seconds. Perform twice per day on each leg.

## 2015-05-08 ENCOUNTER — Ambulatory Visit: Payer: Medicare Other | Admitting: Physical Therapy

## 2015-05-08 DIAGNOSIS — R269 Unspecified abnormalities of gait and mobility: Secondary | ICD-10-CM | POA: Diagnosis not present

## 2015-05-08 DIAGNOSIS — R531 Weakness: Secondary | ICD-10-CM

## 2015-05-08 DIAGNOSIS — R2681 Unsteadiness on feet: Secondary | ICD-10-CM

## 2015-05-08 NOTE — Therapy (Signed)
[facility] [address] [place], [place], [address] Phone: [phone]   Fax:  [phone]  Physical Therapy Treatment  Patient Details  Name: Kylie Calhoun MRN: [id number] Date of Birth: [birth year] Referring Provider: Dr. Rosalin Hawking (referred by hospitalist; pt requesting Dr. Erlinda Hong receive eval)  Encounter Date: 05/08/2015      PT End of Session - 05/08/15 0844    Visit Number 5   Number of Visits 9   Date for PT Re-Evaluation 05/28/15   Authorization Type UHC MCR - GCodes required   PT Start Time 0845   PT Stop Time 0931   PT Time Calculation (min) 46 min   Equipment Utilized During Treatment Gait belt   Activity Tolerance Patient tolerated treatment well   Behavior During Therapy [facility] for tasks assessed/performed      Past Medical History  Diagnosis Date  . Bladder cancer ([place]) 2011    3 times  . Hx of osteopenia   . Diverticulosis     mild per colonoscopy  . Hemorrhoid     internal per colonoscopy 2016  . Stroke [facility])     Past Surgical History  Procedure Laterality Date  . 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 Tonsillectomy    . Abdominal hysterectomy    . Appendectomy    . Bladder surgery      scope and removal of tumors    There were no vitals filed for this visit.  Visit Diagnosis:  Abnormality of gait  Unsteadiness  Generalized weakness      Subjective Assessment - 05/08/15 0844    Subjective I'm feeling a little shaky especially in the mornings. Pt reports compliance with HEP. Denies falls at this time. Says she was able to carry a grocery bag of clothes from her car for donations at the church.   Pertinent History PMH: CVA 03/19/15; bladder cancer with recurrence x2 (currently in remission).   Patient Stated Goals "Just not to be wobbly."   Currently in Pain? No/denies   Multiple Pain Sites No                         [facility] Adult PT  Treatment/Exercise - 05/08/15 0849    Bed Mobility   Bed Mobility --   Ambulation/Gait   Ambulation/Gait Yes   Ambulation/Gait Assistance 4: Min guard;5: Supervision;4: Min assist   Ambulation/Gait Assistance Details Ambulated around track x2. Pt required supervision for linear gait with no dual tasking. Pt ambulated down 20 ft hallway while performing head turns in horizontal plane, vertical plane, performing card finds to facilitate head turns and dual tasking during gait, and ambulating with eyes closed. Pt required min guard during head turns and card finds, min A during eyes closed. Pt required verbal cues to perform arm swing during gait eyes closed.Performed 10 total trips.    Ambulation Distance (Feet) 450 Feet   Assistive device None   Gait Pattern Wide base of support;Decreased arm swing - left;Decreased arm swing - right   Ambulation Surface Level;Indoor   Stairs Yes   Stairs Assistance 7: Independent   Stairs Assistance Details (indicate cue type and reason) Independent for ascent, no UE/rail use, supervision with descent. Pt used 1 rail/UE during descent and performed with increased time. Pt demonstrated ability to negotiate stairs ascent/descent independently with no UE/rail use on subsequent performance.   Stair Management Technique No rails;One rail Right  Number of Stairs 4  x4   Height of Stairs 6   Neuro Re-ed    Neuro Re-ed Details  Performed standing toe taps bilaterally x10 each on first two stairs with no UE assist and supervision. Pt performed step up/step over blue dyna disk in A/P and med/lat directions next to countertop with 1 UE assist for stability and safety prn. x10 each direction. Pt able to perform med/lat step with no UE assist, required 1 UE assist with A/P. Pt demonstrated increased postural sway and UE to avoid LOB when stepping in post direction. Pt performed tandem gait on blue foam beam forward/backward x6 in parallel bars and min guard for pt safety. Pt  required verbal cues from PT to perform without UE assist. Performed vertical head turns with back foot on stair and front foot on floor with single UE assist. Performed x1                PT Education - 05/08/15 1306    Education provided Yes   Education Details Added stationary step down with vertical head turns to HEP. Pt instructed to perform this with UE assist at all times for pt safety. Pt inquired about utilizing a personal trainer and getting back to gym routine. Pt was encouraged to exercise, but to make personal trainer aware of her limitations; pt safety was stressed.   Person(s) Educated Patient   Methods Explanation;Demonstration;Verbal cues   Comprehension Verbalized understanding;Returned demonstration;Verbal cues required          PT Short Term Goals - 04/27/15 1037    PT SHORT TERM GOAL #1   Title Pt will perform initial HEP with mod I using paper handout to maximize functional gains made in PT. Target date: 04/26/15   Baseline Met 1/13.   Status Achieved   PT SHORT TERM GOAL #2   Title Pt will improve FGA score from 16/30 to 20/30 to indicate improved dynamic gait stability. Target date: 04/26/15   Baseline 1/13:  FGA score = 21/30   Status Achieved   PT SHORT TERM GOAL #3   Title Pt will independently negotiate standard curb step without AD to indicate ability to safely use primary entrance of home. Target date: 04/26/15   Baseline Met 1/13.   Status Achieved   PT SHORT TERM GOAL #4   Title Pt will independently negotiate 13 stairs with single L rail to enable pt to safely access second floor of home. Target date: 04/26/15   Baseline Met 1/13.   Status Achieved   PT SHORT TERM GOAL #5   Title Pt will verbalize understanding of CVA warning signs, pertinent risk factors to prevent future CVA. Target date: 04/26/15   Baseline Met 1/13.   Status Achieved           PT Long Term Goals - 03/29/15 2000    PT LONG TERM GOAL #1   Title Pt will improve FGA score  to > / = 23/30 to indicate decreased risk of falling. Target date: 05/24/15   PT LONG TERM GOAL #2   Title Pt will independently negotiate standard curb step without rail with item in one hand with no overt LOB to indicate pt ability to safely carry groceries into home. Target date: 05/24/15    PT LONG TERM GOAL #3   Title Pt will independently ambulate 1,000'' over unlevel, paved surfaces to return to PLOF as independent community mobility. Target date: 05/24/15   PT LONG TERM GOAL #4  Title Pt will independently traverse standard ramps, door sills, and surface changes to indicate safety negotiation community obstacles. Target date: 05/24/15   PT LONG TERM GOAL #5   Title Pt will attend 30-minute aeorbics class without limitation to indicate safe return to commnuity fitness. Target date: 05/24/15   Additional Long Term Goals   Additional Long Term Goals Yes   PT LONG TERM GOAL #6   Title Pt will independently negotiate 4 stairs without rails (to simulate church chior loft) to enable pt to participate in church chior. Target dte: 05/24/15               Plan - 05/08/15 1310    Clinical Impression Statement Session focused on high level dynamic balance exercises and ambulation with head turns and dual tasking. Pt displays tendency to look towards ground when she walks and has difficulty with head turns. Balance is improving as pt is able to decrease UE support while performing dynamic balance tasks. Continues to have difficulty descending steps and with dynamic balance tasks in A/P direction. An excercise was added to her HEP to address this. Continue working towards pt goals.   Pt will benefit from skilled therapeutic intervention in order to improve on the following deficits Abnormal gait;Decreased balance;Decreased strength;Postural dysfunction;Decreased endurance   Rehab Potential Good   Clinical Impairments Affecting Rehab Potential financial limitations (high insurance co-pay)   PT Frequency 1x  / week   PT Duration 8 weeks   PT Treatment/Interventions ADLs/Self Care Home Management;Electrical Stimulation;Canalith Repostioning;Gait training;Stair training;Functional mobility training;Therapeutic activities;Therapeutic exercise;Balance training;Orthotic Fit/Training;Patient/family education;Neuromuscular re-education;Manual techniques;Vestibular   PT Next Visit Plan Eccentric quad strengthening (step downs). Vertical/horizontal head turns with feet simulating stair ascent/descent. Consider checking STGs/LTGs and preparing for discharge if appropriate. Assess outdoor mobility if necessary. Check and progress HEP.    Consulted and Agree with Plan of Care Patient        Problem List Patient Active Problem List   Diagnosis Date Noted  . Stroke ([place]) 03/20/2015  . Stroke with cerebral ischemia ([place])   . HLD (hyperlipidemia)   . TIA (transient ischemic attack) 03/19/2015  . Unsteady gait 03/19/2015  . Hx of osteopenia   . Bladder cancer ([place])   . Hemorrhoid   . Diverticulosis   . Ischemic stroke ([place])   . Malignant neoplasm of urinary bladder ([place])     De Nurse, SPT 05/08/2015, 1:20 PM  [facility] [address] [place], [place], [address] Phone: [phone]   Fax:  [phone]  Name: Kylie Calhoun MRN: [id number] Date of Birth: 1950/05/28

## 2015-05-11 ENCOUNTER — Encounter: Payer: Self-pay | Admitting: Neurology

## 2015-05-11 ENCOUNTER — Ambulatory Visit (INDEPENDENT_AMBULATORY_CARE_PROVIDER_SITE_OTHER): Payer: Medicare Other | Admitting: Neurology

## 2015-05-11 VITALS — BP 127/75 | HR 73 | Ht 63.0 in | Wt 133.0 lb

## 2015-05-11 DIAGNOSIS — I69398 Other sequelae of cerebral infarction: Secondary | ICD-10-CM

## 2015-05-11 DIAGNOSIS — I69898 Other sequelae of other cerebrovascular disease: Secondary | ICD-10-CM

## 2015-05-11 DIAGNOSIS — R2681 Unsteadiness on feet: Secondary | ICD-10-CM | POA: Diagnosis not present

## 2015-05-11 DIAGNOSIS — R209 Unspecified disturbances of skin sensation: Secondary | ICD-10-CM

## 2015-05-11 HISTORY — DX: Other sequelae of cerebral infarction: I69.398

## 2015-05-11 NOTE — Patient Instructions (Signed)
If you are doing well, you may stop the Plavix around mid-March. Stay on the aspirin.  Fall Prevention in the Home  Falls can cause injuries and can affect people from all age groups. There are many simple things that you can do to make your home safe and to help prevent falls. WHAT CAN I DO ON THE OUTSIDE OF MY HOME?  Regularly repair the edges of walkways and driveways and fix any cracks.  Remove high doorway thresholds.  Trim any shrubbery on the main path into your home.  Use bright outdoor lighting.  Clear walkways of debris and clutter, including tools and rocks.  Regularly check that handrails are securely fastened and in good repair. Both sides of any steps should have handrails.  Install guardrails along the edges of any raised decks or porches.  Have leaves, snow, and ice cleared regularly.  Use sand or salt on walkways during winter months.  In the garage, clean up any spills right away, including grease or oil spills. WHAT CAN I DO IN THE BATHROOM?  Use night lights.  Install grab bars by the toilet and in the tub and shower. Do not use towel bars as grab bars.  Use non-skid mats or decals on the floor of the tub or shower.  If you need to sit down while you are in the shower, use a plastic, non-slip stool.[place] [employer]  Keep the floor dry. Immediately clean up any water that spills on the floor.  Remove soap buildup in the tub or shower on a regular basis.  Attach bath mats securely with double-sided non-slip rug tape.  Remove throw rugs and other tripping hazards from the floor. WHAT CAN I DO IN THE BEDROOM?  Use night lights.  Make sure that a bedside light is easy to reach.  Do not use oversized bedding that drapes onto the floor.  Have a firm chair that has side arms to use for getting dressed.  Remove throw rugs and other tripping hazards from the floor. WHAT CAN I DO IN THE KITCHEN?   Clean up any spills right away.  Avoid walking on wet  floors.  Place frequently used items in easy-to-reach places.  If you need to reach for something above you, use a sturdy step stool that has a grab bar.  Keep electrical cables out of the way.  Do not use floor polish or wax that makes floors slippery. If you have to use wax, make sure that it is non-skid floor wax.  Remove throw rugs and other tripping hazards from the floor. WHAT CAN I DO IN THE STAIRWAYS?  Do not leave any items on the stairs.  Make sure that there are handrails on both sides of the stairs. Fix handrails that are broken or loose. Make sure that handrails are as long as the stairways.  Check any carpeting to make sure that it is firmly attached to the stairs. Fix any carpet that is loose or worn.  Avoid having throw rugs at the top or bottom of stairways, or secure the rugs with carpet tape to prevent them from moving.  Make sure that you have a light switch at the top of the stairs and the bottom of the stairs. If you do not have them, have them installed. WHAT ARE SOME OTHER FALL PREVENTION TIPS?  Wear closed-toe shoes that fit well and support your feet. Wear shoes that have rubber soles or low heels.  When you use a stepladder, make sure that  it is completely opened and that the sides are firmly locked. Have someone hold the ladder while you are using it. Do not climb a closed stepladder.  Add color or contrast paint or tape to grab bars and handrails in your home. Place contrasting color strips on the first and last steps.  Use mobility aids as needed, such as canes, walkers, scooters, and crutches.  Turn on lights if it is dark. Replace any light bulbs that burn out.  Set up furniture so that there are clear paths. Keep the furniture in the same spot.  Fix any uneven floor surfaces.  Choose a carpet design that does not hide the edge of steps of a stairway.  Be aware of any and all pets.  Review your medicines with your healthcare provider. Some  medicines can cause dizziness or changes in blood pressure, which increase your risk of falling. Talk with your health care provider about other ways that you can decrease your risk of falls. This may include working with a physical therapist or trainer to improve your strength, balance, and endurance.   This information is not intended to replace advice given to you by your health care provider. Make sure you discuss any questions you have with your health care provider.   Document Released: 03/21/2002 Document Revised: 08/15/2014 Document Reviewed: 05/05/2014 Elsevier Interactive Patient Education [employer].

## 2015-05-11 NOTE — Progress Notes (Signed)
Reason for visit: Recent stroke  Referring physician: [facility]  Kylie Calhoun is a 65 y.o. female  History of present illness:  Kylie Calhoun is a 65 year old right-handed white female with a history of a recent stroke event that occurred around 03/19/2015. The patient noted onset of some right face, arm, and leg numbness. She developed a tendency to lean to the right side, and some gait instability. The patient denied any visual field changes, headache, or slurred speech. The patient became concerned after she looked up her symptoms on her cell phone, and decided to call 911. She went to the hospital and underwent a stroke workup. MRI of the brain showed evidence of a small left thalamic stroke, MRA of the head showed atherosclerotic disease of the proximal left posterior cerebral artery. The patient has undergone carotid Doppler studies that were unremarkable, 2-D echocardiogram did not show evidence of a cardiogenic source of stroke. The patient had not been on any antiplatelet agents prior to admission, she was started on a combination of aspirin and Plavix. The patient remains on these medications. The LDL cholesterol was 118, the patient has been placed on Lipitor. She has undergone physical therapy, she continues with therapy at this time. She has not gotten back to her baseline. She currently lives alone in a town home, she does have 2 levels to her home. She is having some issues with a wide-based stance, she denies any falls. She reports some residual numbness in the upper arm on the right. She denies any memory issues. She comes to this office for an evaluation.  Past Medical History  Diagnosis Date  . Bladder cancer ([place]) 2011    3 times  . Hx of osteopenia   . Diverticulosis     mild per colonoscopy  . Hemorrhoid     internal per colonoscopy 2016  . Stroke ([place])   . Dyslipidemia   . Ankle fracture, left   . Alteration of sensation as late effect of stroke 05/11/2015     Past Surgical History  Procedure Laterality Date  . 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 Tonsillectomy    . Abdominal hysterectomy    . Appendectomy    . Bladder surgery      scope and removal of tumors    Family History  Problem Relation Age of Onset  . Heart disease Father   . Stroke Sister   . Hypertension Sister   . Stroke Brother   . Hypertension Brother     Social history:  reports that she has never smoked. She has never used smokeless tobacco. She reports that she drinks alcohol. She reports that she does not use illicit drugs.  Medications:  Prior to Admission medications   Medication Sig Start Date End Date Taking? Authorizing Provider  aspirin 325 MG tablet Take 325 mg by mouth daily.   Yes Historical Provider, MD  atorvastatin (LIPITOR) 20 MG tablet Take 1 tablet by mouth daily at 6 PM.  03/21/15  Yes Historical Provider, MD  Calcium Citrate-Vitamin D (CALCIUM + D PO) Take 2 tablets by mouth daily.   Yes Historical Provider, MD  cholecalciferol (VITAMIN D) 1000 UNITS tablet Take 2,000 Units by mouth daily.   Yes Historical Provider, MD  clopidogrel (PLAVIX) 75 MG tablet Take 1 tablet (75 mg total) by mouth daily. 03/21/15  Yes Rosalin Hawking, MD     No Known Allergies  ROS:  Out of  a complete 14 system review of symptoms, the patient complains only of the following symptoms, and all other reviewed systems are negative.  Fatigue Easy bruising, easy bleeding Urination problems Numbness, dizziness  Blood pressure 127/75, pulse 73, height 5\' 3"  (1.6 m), weight 133 lb (60.328 kg).  Physical Exam  General: The patient is alert and cooperative at the time of the examination. While sitting, the patient appears to have a tendency to lean to the right.  Eyes: Pupils are equal, round, and reactive to light. Discs are flat bilaterally.  Neck: The neck is supple, no carotid bruits are noted.  Respiratory: The respiratory examination is  clear.  Cardiovascular: The cardiovascular examination reveals a regular rate and rhythm, no obvious murmurs or rubs are noted.  Skin: Extremities are without significant edema.  Neurologic Exam  Mental status: The patient is alert and oriented x 3 at the time of the examination. The patient has apparent normal recent and remote memory, with an apparently normal attention span and concentration ability.  Cranial nerves: Facial symmetry is present. There is good sensation of the face to pinprick and soft touch bilaterally. The strength of the facial muscles and the muscles to head turning and shoulder shrug are normal bilaterally. Speech is well enunciated, no aphasia or dysarthria is noted. Extraocular movements are full. Visual fields are full. The tongue is midline, and the patient has symmetric elevation of the soft palate. No obvious hearing deficits are noted.  Motor: The motor testing reveals 5 over 5 strength of all 4 extremities. Good symmetric motor tone is noted throughout.  Sensory: Sensory testing is intact to pinprick, soft touch, vibration sensation, and position sense on all 4 extremities, with exception there is some decrease in position sense of the right foot. No evidence of extinction is noted.  Coordination: Cerebellar testing reveals good finger-nose-finger and heel-to-shin bilaterally.  Gait and station: Gait is wide-based, slightly unsteady. Tandem gait is unsteady. Romberg is negative. No drift is seen.  Reflexes: Deep tendon reflexes are symmetric and normal bilaterally. Toes are downgoing bilaterally.   Ct Head Wo Contrast 03/19/2015 No acute intracranial pathology. Age-related atrophy and chronic microvascular ischemic disease. 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 Clinical correlation is recommended.   Mri & Mra Brain Wo Contrast 03/19/2015 1. 5 mm left thalamic infarct, likely early subacute. 2. Ventricular enlargement favored to reflect moderate cerebral atrophy over  hydrocephalus. 3. Mild chronic small vessel ischemic disease. 4. No major intracranial arterial occlusion. 5. Moderate proximal left P2 PCA stenosis.  * MRI scan images were reviewed online. I agree with the written report.   Carotid Doppler  Bilateral: 1-39% ICA stenosis. Vertebral artery flow is antegrade.  2D Echocardiogram  - Left ventricle: The cavity size was normal. Wall thickness wasnormal. Systolic function was normal. The estimated ejectionfraction was in the range of 60% to 65%. Wall motion was normal;there were no regional wall motion abnormalities. Dopplerparameters more consistent with abnormal left ventricularrelaxation (grade 1 diastolic dysfunction).   Assessment/Plan:  1. Cerebrovascular disease, left thalamic stroke  2. Residual gait disorder, right arm numbness  The patient has a residual issue with some gait instability. The patient lives alone, but she does have family in this area. She continues with her physical therapy. The patient currently is on aspirin and Plavix combination, she will continue this until mid-March and then discontinue the Plavix. The patient is now on a cholesterol lowering agent. She will continue physical therapy for now, she will follow-up through this office in 4  months. The etiology of her stroke likely was the proximal atherosclerotic changes of the left posterior cerebral artery, this region of the artery is the origin of penetrator vessels that supply the thalamus. The patient is to contact our office if she has any concerns.  Jill Alexanders MD 05/11/2015 4:22 PM  [facility] [address] [place], [place] [address]  Phone [phone] Fax [phone]

## 2015-05-15 ENCOUNTER — Ambulatory Visit: Payer: Medicare Other | Admitting: Physical Therapy

## 2015-05-15 DIAGNOSIS — R269 Unspecified abnormalities of gait and mobility: Secondary | ICD-10-CM | POA: Diagnosis not present

## 2015-05-15 DIAGNOSIS — R2681 Unsteadiness on feet: Secondary | ICD-10-CM

## 2015-05-15 DIAGNOSIS — R531 Weakness: Secondary | ICD-10-CM

## 2015-05-15 NOTE — Therapy (Signed)
[place] [address] [place], [place], [address] Phone: [phone]   Fax:  [phone]  Physical Therapy Treatment  Patient Details  Name: Kylie Calhoun MRN: [id number] Date of Birth: Jan 24, 1951 Referring Provider: Dr. Rosalin Hawking (referred by hospitalist; pt requesting Dr. Erlinda Hong receive eval)  Encounter Date: 05/15/2015      PT End of Session - 05/15/15 2027    Visit Number 6   Number of Visits 9   Date for PT Re-Evaluation 05/28/15   Authorization Type U[employer] MCR - GCodes required   PT Start Time 6606  Pt arrived late to session   PT Stop Time 0933   PT Time Calculation (min) 43 min   Equipment Utilized During Treatment Gait belt   Activity Tolerance Patient limited by fatigue   Behavior During Therapy [facility] for tasks assessed/performed      Past Medical History  Diagnosis Date  . Bladder cancer ([place]) 2011    3 times  . Hx of osteopenia   . Diverticulosis     mild per colonoscopy  . Hemorrhoid     internal per colonoscopy 2016  . Stroke ([place])   . Dyslipidemia   . Ankle fracture, left   . Alteration of sensation as late effect of stroke 05/11/2015    Past Surgical History  Procedure Laterality Date  . 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 Tonsillectomy    . Abdominal hysterectomy    . Appendectomy    . Bladder surgery      scope and removal of tumors    There were no vitals filed for this visit.  Visit Diagnosis:  Abnormality of gait  Unsteadiness  Generalized weakness      Subjective Assessment - 05/15/15 0852    Subjective "I've got great news: I saw Dr. Jannifer Franklin last week and he said I can come off the Plavix in March. I thought I was going to have to be on the Plavix for the rest of my life." Pt also states, "I think I'm moving better. My walking feels more stable."   Pertinent History PMH: CVA 03/19/15; bladder cancer with recurrence x2 (currently in remission).   Patient Stated Goals "Just not to be wobbly."   Currently in Pain? No/denies                         [facility] Adult PT Treatment/Exercise - 05/15/15 0001    Neuro Re-ed    Neuro Re-ed Details  Standing on ramp (descending) with min guard, pt performed horizontal and vertical head tusn x10 reps with each LE leading. Transitioned to pt performed the following at parallel bars with min guard: RLE heel dips from 4" step progressing from BUE support x5 reps > single UE support x15 reps > no UE support x10 reps; head turns with RLE on ground and LLE on 4" step (as though descending stairs) x10 reps vertical and x10 reps horizontal; BLE jumping on trampoline x30 reps; hopping from RLE to LLE on trampoline x20 reps; lateral hopping from RLE to LLE  x20 reps, progressing from BUE support > no UE support; A/P hopping from RLE to LLE on solid ground x20 reps with each LE leading; jumping (with BLE's) A/P x120 reps and laterally x20 reps with noted difficulty coordinating BUE's to leave ground simultaneously. Activities focused on dynamic single limb stance stability/control.   Pt requested seated rest break x1  due to fatigue         Vestibular Treatment/Exercise - 05/15/15 2030    Vestibular Treatment/Exercise   Vestibular Treatment Provided Gaze   Gaze Exercises X1 Viewing Horizontal;X1 Viewing Vertical   X1 Viewing Horizontal   Foot Position standing; shoulder width   Comments x45 seconds   X1 Viewing Vertical   Foot Position standing; shoulder width   Comments x45 seconds               PT Education - 05/15/15 2018    Education provided Yes   Education Details HEP: added hopping/jumping to facilitate safe transition back to fitness class. See Pt Instructions for details.   Person(s) Educated Patient   Methods Explanation;Demonstration;Verbal cues;Handout   Comprehension Verbalized understanding;Returned demonstration          PT Short Term Goals - 04/27/15 1037    PT  SHORT TERM GOAL #1   Title Pt will perform initial HEP with mod I using paper handout to maximize functional gains made in PT. Target date: 04/26/15   Baseline Met 1/13.   Status Achieved   PT SHORT TERM GOAL #2   Title Pt will improve FGA score from 16/30 to 20/30 to indicate improved dynamic gait stability. Target date: 04/26/15   Baseline 1/13:  FGA score = 21/30   Status Achieved   PT SHORT TERM GOAL #3   Title Pt will independently negotiate standard curb step without AD to indicate ability to safely use primary entrance of home. Target date: 04/26/15   Baseline Met 1/13.   Status Achieved   PT SHORT TERM GOAL #4   Title Pt will independently negotiate 13 stairs with single L rail to enable pt to safely access second floor of home. Target date: 04/26/15   Baseline Met 1/13.   Status Achieved   PT SHORT TERM GOAL #5   Title Pt will verbalize understanding of CVA warning signs, pertinent risk factors to prevent future CVA. Target date: 04/26/15   Baseline Met 1/13.   Status Achieved           PT Long Term Goals - 03/29/15 2000    PT LONG TERM GOAL #1   Title Pt will improve FGA score to > / = 23/30 to indicate decreased risk of falling. Target date: 05/24/15   PT LONG TERM GOAL #2   Title Pt will independently negotiate standard curb step without rail with item in one hand with no overt LOB to indicate pt ability to safely carry groceries into home. Target date: 05/24/15    PT LONG TERM GOAL #3   Title Pt will independently ambulate 1,000'' over unlevel, paved surfaces to return to PLOF as independent community mobility. Target date: 05/24/15   PT LONG TERM GOAL #4   Title Pt will independently traverse standard ramps, door sills, and surface changes to indicate safety negotiation community obstacles. Target date: 05/24/15   PT LONG TERM GOAL #5   Title Pt will attend 30-minute aeorbics class without limitation to indicate safe return to commnuity fitness. Target date: 05/24/15   Additional  Long Term Goals   Additional Long Term Goals Yes   PT LONG TERM GOAL #6   Title Pt will independently negotiate 4 stairs without rails (to simulate church chior loft) to enable pt to participate in church chior. Target dte: 05/24/15               Plan - 05/15/15 2029    Clinical Impression Statement Session  focused on performance of dynamic jumping/hopping to facilitate safe return to community fitness class and to address dynamic single limb stance control, LE coordination, and functional endurance. Noted pt difficulty coordinating jumping with BLE's simultaneosly. Also noted limited endurance and inconsistent breathiing throughout dynamic activities. Added jumping/hopping (at countertop for safety) to HEP with recommendation for pt to monitor vital signs.    Pt will benefit from skilled therapeutic intervention in order to improve on the following deficits Abnormal gait;Decreased balance;Decreased strength;Postural dysfunction;Decreased endurance   Rehab Potential Good   Clinical Impairments Affecting Rehab Potential financial limitations (high insurance co-pay)   PT Frequency 1x / week   PT Duration 8 weeks   PT Treatment/Interventions ADLs/Self Care Home Management;Electrical Stimulation;Canalith Repostioning;Gait training;Stair training;Functional mobility training;Therapeutic activities;Therapeutic exercise;Balance training;Orthotic Fit/Training;Patient/family education;Neuromuscular re-education;Manual techniques;Vestibular   PT Next Visit Plan Check new additions to HEP (hopping, jumping). Revisit step-down exercise.   Consulted and Agree with Plan of Care Patient        Problem List Patient Active Problem List   Diagnosis Date Noted  . 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 Stroke ([place]) 03/20/2015  . Stroke with cerebral ischemia ([place])   . HLD (hyperlipidemia)   . TIA (transient ischemic attack) 03/19/2015  . Unsteady gait 03/19/2015  . Hx of  osteopenia   . Bladder cancer ([place])   . Hemorrhoid   . Diverticulosis   . Ischemic stroke ([place])   . 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 [place], [place], [address] Phone: [phone]   Fax:  [phone] 05/15/2015, 8:35 PM   Name: Kylie Calhoun MRN: [id number] Date of Birth: [birth year]

## 2015-05-15 NOTE — Patient Instructions (Addendum)
Feet Apart (Compliant Surface) Head Motion - Eyes Closed    Stand with your back to a corner with a stable chair in front of you. Stand on 1 pillow with feet shoulder width apart.   First: Close eyes and move head slowly, up and down 10 times; from right to left 10 times. Then: Open eyes and move head slowly diagonally right/up to left/down 10 times; then diagonally left/up to right/down 10 times. Repeat __2-3__ times per day.  Single limb stance    While still standing with back to corner with stable chair in front of you (as in first exercise), stand on one leg, trying not to hold onto chair unless needed. Try to hold 15 seconds on each leg. Perform 5 times on each, 2-3 times per day. * Try this exercise with your full length mirror in front of you.  Feet Heel-Toe "Tandem"    Stand with one hand on countertop. Walk a straight line bringing one foot directly in front of the other. *Progress this exercise by decreasing support from entire hand to 2-3 finger tip support. Repeat for __3__ minutes per session. Do __2__ sessions per day.   Abduction: Clam (Eccentric) - Side-Lying    Lie on RIGHT side with knees bent. Hold onto the edge of your bed with your top (left) hand. Lift LEFT knee, keeping feet together. Keep trunk steady - *Don't let top hip roll backwards (It may help to reach toward the edge of the bed with your left arm).  Slowly lower for 3-5 seconds. You should feel your "back pocket" muscle working. Perform 12 reps, 3 times per day with the LEFT leg. As this gets easier, increase by 2-3 repetitions, as tolerated.   Abd    Lie on back and loop GREEN Theraband around legs, just above knees. Push out against band with legs, then lift hips to bridge. Hold for 1-2 seconds, then slowly lower to starting position. Perform 20 reps, 3 times per day.   Gaze Stabilization: Tip Card 1.Target must remain in focus, not blurry, and appear stationary while head is in  motion. 2.Perform exercises with small head movements (45 to either side of midline). 3.Increase speed of head motion so long as target is in focus. 4.If you wear eyeglasses, be sure you can see target through lens (therapist will give specific instructions for bifocal / progressive lenses). 5.These exercises may provoke dizziness or nausea. Work through these symptoms. If too dizzy, slow head movement slightly. Rest between each exercise. 6.Exercises demand concentration; avoid distractions. 7.For safety, perform standing exercises close to a counter, wall, corner, or next to someone.  Copyright  VHI. All rights reserved.  Gaze Stabilization: Standing Feet Apart   Feet shoulder width apart, keeping eyes on target on wall 5-6 feet away, tilt head down slightly and move head side to side for 45 seconds (set a timer to count town). Repeat while moving head up and down for 45 repetitions. Do 2 sessions per day. If you find that you're consistently losing the target ("A") with your eyes, don't turn head quite as far.   HIP: Flexors - Supine    Lie on edge of surface (your couch). Place leg off the surface, allow knee to bend. Bring other knee toward chest. Hold _60__ seconds. Perform twice per day on each leg.    Perform these exercises standing facing your counter. Start with both hands on countertop and progress to finger tip support then no hands. Monitor your vital signs  and keep track of how long you can perform these exercises. Try to progress time by 30-60 seconds at a time, as tolerated.  Do these exercises every other day (alternating with exercises above).    Hop laterally from right to left foot. Do this 30 times. Relax. Repeat 2 times per day.     1. Jump (with both feet) in place 20 times.  2. Jump slightly forward then backward 20 times. 3. Jump slightly to the right then to the left 20 times.

## 2015-05-22 ENCOUNTER — Ambulatory Visit: Payer: Medicare Other | Attending: Internal Medicine | Admitting: Physical Therapy

## 2015-05-22 DIAGNOSIS — R42 Dizziness and giddiness: Secondary | ICD-10-CM

## 2015-05-22 DIAGNOSIS — R269 Unspecified abnormalities of gait and mobility: Secondary | ICD-10-CM

## 2015-05-22 NOTE — Patient Instructions (Signed)
Tip Card 1.The goal of habituation training is to assist in decreasing symptoms of vertigo, dizziness, or nausea provoked by specific head and body motions. 2.These exercises may initially increase symptoms; however, be persistent and work through symptoms. With repetition and time, the exercises will assist in reducing or eliminating symptoms. 3.Exercises should be stopped and discussed with the therapist if you experience any of the following: - Sudden change or fluctuation in hearing - New onset of ringing in the ears, or increase in current intensity - Any fluid discharge from the ear - Severe pain in neck or back - Extreme nausea - if symptoms of dizziness or nausea increase more than 2 points (out of 10) from baseline  Copyright  VHI. All rights reserved.  Rolling   With pillow under head, start on back. Roll to your right side.  Hold until dizziness stops, plus 20 seconds and then roll to the left side.  Hold until dizziness stops, plus 20 seconds.  Repeat sequence 5 times per session. Do 2 sessions per day.  Copyright  VHI. All rights reserved.  Sit to Side-Lying   Sit on edge of bed. Lie down onto the right side and hold until dizziness stops, plus 20 seconds.  Return to sitting and wait until dizziness stops, plus 20 seconds.  Repeat to the left side. Repeat sequence 5 times per session. Do 2 sessions per day.  Head Motion: Side to Side    Sitting, tilt head down slightly, slowly turn head right to left 5 times. Relax. Wait for symptoms to return to baseline. Repeat 3 times per day. Repeat __4__ times per session. Do __3___ sessions per day. Copyright  VHI. All rights reserved.    Gaze Stabilization: Tip Card 1.Target must remain in focus, not blurry, and appear stationary while head is in motion. 2.Perform exercises with small head movements (45 to either side of midline). 3.Increase speed of head motion so long as target is in focus. 4.If you wear eyeglasses, be sure  you can see target through lens (therapist will give specific instructions for bifocal / progressive lenses). 5.These exercises may provoke dizziness or nausea. Work through these symptoms. If too dizzy, slow head movement slightly. Rest between each exercise. 6.Exercises demand concentration; avoid distractions. 7.For safety, perform standing exercises close to a counter, wall, corner, or next to someone.  Copyright  VHI. All rights reserved.  Gaze Stabilization: Standing Feet Apart   Feet shoulder width apart, keeping eyes on target on wall 3 feet away, tilt head down slightly and move head side to side for 45 seconds. Repeat while moving head up and down for 45 seconds. Do 2 sessions per day.    Copyright  VHI. All rights reserved.

## 2015-05-22 NOTE — Therapy (Addendum)
[facility] [address] [place], [place], [address] Phone: [phone]   Fax:  [phone]  Physical Therapy Treatment and Discharge Summary  Patient Details  Name: Kylie Calhoun MRN: [id number] Date of Birth: 01/30/1951 Referring Provider: Dr. Rosalin Hawking (referred by hospitalist; pt requesting Dr. Erlinda Hong receive eval)  Encounter Date: 05/22/2015      PT End of Session - 05/22/15 1139    Visit Number 7   Number of Visits 9   Date for PT Re-Evaluation 05/28/15   Authorization Type [employer] MCR - GCodes required   PT Start Time 0850   PT Stop Time 0930   PT Time Calculation (min) 40 min   Activity Tolerance Other (comment)  Limited by motion sensitivity with head turns   Behavior During Therapy [facility] for tasks assessed/performed      Past Medical History  Diagnosis Date  . Bladder cancer ([place]) 2011    3 times  . Hx of osteopenia   . Diverticulosis     mild per colonoscopy  . Hemorrhoid     internal per colonoscopy 2016  . Stroke ([place])   . Dyslipidemia   . Ankle fracture, left   . Alteration of sensation as late effect of stroke 05/11/2015    Past Surgical History  Procedure Laterality Date  . 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 Tonsillectomy    . Abdominal hysterectomy    . Appendectomy    . Bladder surgery      scope and removal of tumors    There were no vitals filed for this visit.  Visit Diagnosis:  Dizziness and giddiness  Abnormality of gait      Subjective Assessment - 05/22/15 0851    Subjective "I had a lot of dizziness last week. Moreso than normal. Maybe it's good; maybe I'm just shaking things up in there and moving the cob webs. If I do too many head motions, I'm done for the day and I have to go lie down."   Pertinent History PMH: CVA 03/19/15; bladder cancer with recurrence x2 (currently in remission).   Patient Stated Goals "Just not to be wobbly."   Currently in  Pain? No/denies                Vestibular Assessment - 05/22/15 0001    Positional Sensitivities   Sit to Supine No dizziness   Supine to Left Side No dizziness   Supine to Right Side Lightheadedness   Supine to Sitting Lightheadedness   Right Hallpike No dizziness   Up from Right Hallpike No dizziness   Up from Left Hallpike No dizziness   Nose to Right Knee No dizziness   Right Knee to Sitting No dizziness   Nose to Left Knee No dizziness   Left Knee to Sitting Lightheadedness   Head Turning x 5 Mild dizziness   Head Nodding x 5 No dizziness   Pivot Right in Standing Lightheadedness   Pivot Left in Standing Lightheadedness   Rolling Right Lightheadedness   Rolling Left No dizziness   Positional Sensitivities Comments --   Orthostatics   BP supine (x 5 minutes) 123/71 mmHg   HR supine (x 5 minutes) 75   BP standing (after 1 minute) 123/78 mmHg   HR standing (after 1 minute) 80   BP standing (after 3 minutes) 142/88 mmHg   HR standing (after 3 minutes) 76   Orthostatics Comment After 30 sec,  vitals: BP 121/66, HR 71                  Vestibular Treatment/Exercise - 05/22/15 0001    Vestibular Treatment/Exercise   Vestibular Treatment Provided Habituation;Gaze   Habituation Exercises Laruth Bouchard Daroff;Horizontal Roll;Seated Horizontal Head Turns   Gaze Exercises X1 Viewing Horizontal;X1 Viewing Vertical   Nestor Lewandowsky   Number of Reps  5   Symptom Description  "a little off", per pt   Horizontal Roll   Number of Reps  5   Symptom Description  1/10 symptoms mitigated after 5 reps of habituation.   Seated Horizontal Head Turns   Number of Reps  5  x2 trials   Symptom Description  2/10 symptoms during initial trial (x5 reps); no symptoms during subsequent trial.   X1 Viewing Horizontal   Foot Position standing   Comments x45 sec   X1 Viewing Vertical   Foot Position standing   Comments x45 sec               PT Education - 05/22/15 1132     Education provided Yes   Education Details Recommend pt hold current HEP and perform vestibular HEP until motion sensitivity reassessed. See Pt Instructions.   Person(s) Educated Patient   Methods Explanation;Demonstration;Verbal cues;Handout   Comprehension Verbalized understanding;Returned demonstration          PT Short Term Goals - 04/27/15 1037    PT SHORT TERM GOAL #1   Title Pt will perform initial HEP with mod I using paper handout to maximize functional gains made in PT. Target date: 04/26/15   Baseline Met 1/13.   Status Achieved   PT SHORT TERM GOAL #2   Title Pt will improve FGA score from 16/30 to 20/30 to indicate improved dynamic gait stability. Target date: 04/26/15   Baseline 1/13:  FGA score = 21/30   Status Achieved   PT SHORT TERM GOAL #3   Title Pt will independently negotiate standard curb step without AD to indicate ability to safely use primary entrance of home. Target date: 04/26/15   Baseline Met 1/13.   Status Achieved   PT SHORT TERM GOAL #4   Title Pt will independently negotiate 13 stairs with single L rail to enable pt to safely access second floor of home. Target date: 04/26/15   Baseline Met 1/13.   Status Achieved   PT SHORT TERM GOAL #5   Title Pt will verbalize understanding of CVA warning signs, pertinent risk factors to prevent future CVA. Target date: 04/26/15   Baseline Met 1/13.   Status Achieved           PT Long Term Goals - 03/29/15 2000    PT LONG TERM GOAL #1   Title Pt will improve FGA score to > / = 23/30 to indicate decreased risk of falling. Target date: 05/24/15   PT LONG TERM GOAL #2   Title Pt will independently negotiate standard curb step without rail with item in one hand with no overt LOB to indicate pt ability to safely carry groceries into home. Target date: 05/24/15    PT LONG TERM GOAL #3   Title Pt will independently ambulate 1,000'' over unlevel, paved surfaces to return to PLOF as independent community mobility. Target  date: 05/24/15   PT LONG TERM GOAL #4   Title Pt will independently traverse standard ramps, door sills, and surface changes to indicate safety negotiation community obstacles. Target date: 05/24/15   PT LONG TERM  GOAL #5   Title Pt will attend 30-minute aeorbics class without limitation to indicate safe return to commnuity fitness. Target date: 05/24/15   Additional Long Term Goals   Additional Long Term Goals Yes   PT LONG TERM GOAL #6   Title Pt will independently negotiate 4 stairs without rails (to simulate church chior loft) to enable pt to participate in church chior. Target dte: 05/24/15               Plan - 05/22/15 1139    Clinical Impression Statement Per pt report of needing to go to bed after last session due to dizziness and nausea after head turns, performed vestibular assessment. Noted increased motion sensitivity with seated horizontal head turns, horizontal rolling, and supine <> sit. No significant findings with orthostatic vital signs. Recommended pt hold current HEP for strengthening and balance and provided HEP for habituation and gaze stabilization. Emphasized importance of resting if symptoms increased > 2 (out of 10) with exercises.    Pt will benefit from skilled therapeutic intervention in order to improve on the following deficits Abnormal gait;Decreased balance;Decreased strength;Postural dysfunction;Decreased endurance   Rehab Potential Good   Clinical Impairments Affecting Rehab Potential financial limitations (high insurance co-pay)   PT Frequency 1x / week   PT Duration 8 weeks   PT Treatment/Interventions ADLs/Self Care Home Management;Electrical Stimulation;Canalith Repostioning;Gait training;Stair training;Functional mobility training;Therapeutic activities;Therapeutic exercise;Balance training;Orthotic Fit/Training;Patient/family education;Neuromuscular re-education;Manual techniques;Vestibular   PT Next Visit Plan Ask about habituation HEP - any significant  dizziness and nausea since last session?  Progress habituation HEP (if pt tolerating) from seated > standing horizontal head turns. If X1 viewing going well, increase time from 45 to 60 sec.   Consulted and Agree with Plan of Care Patient        Problem List Patient Active Problem List   Diagnosis Date Noted  . 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 Stroke ([place]) 03/20/2015  . Stroke with cerebral ischemia ([place])   . HLD (hyperlipidemia)   . TIA (transient ischemic attack) 03/19/2015  . Unsteady gait 03/19/2015  . Hx of osteopenia   . Bladder cancer ([place])   . Hemorrhoid   . Diverticulosis   . Ischemic stroke ([place])   . Malignant neoplasm of urinary bladder ([place])     Billie Ruddy, PT, DPT [facility] [address] [place], [place], [address] Phone: [phone]   Fax:  [phone] 05/22/2015, 11:45 AM   Name: Kylie Calhoun MRN: [id number] Date of Birth: 1951/03/29   .Addendum: PHYSICAL THERAPY DISCHARGE SUMMARY  Visits from Start of Care: 7  Current functional level related to goals / functional outcomes: Unknown, as patient did not return to PT after initial 7 sessions.    Remaining deficits: Unknown, as patient did not return to PT after initial 7 sessions.    Education / Equipment: See above.  Plan: Patient agrees to discharge.  Patient goals were not met. Patient is being discharged due to not returning since the last visit.  ?????        G Codes Functional Assessment Tool: FGA = 21/30 Functional Limitation:  Mobility: Walking and Moving Around Goal Status [id number]): CJ DischargeStatus ([id number]): CJ  Billie Ruddy, PT, DPT [facility] [address] [place], [place], [address] Phone: [phone]   Fax:  [phone] 02/28/16, 9:06 AM

## 2015-05-30 ENCOUNTER — Ambulatory Visit: Payer: Medicare Other | Admitting: Physical Therapy

## 2015-08-31 ENCOUNTER — Telehealth: Payer: Self-pay | Admitting: *Deleted

## 2015-08-31 ENCOUNTER — Telehealth: Payer: Self-pay | Admitting: Neurology

## 2015-08-31 NOTE — Telephone Encounter (Signed)
------------------------------------------------------------   [name]             CID [id number]  Patient Kylie Calhoun                 Pt's Dr Jannifer Franklin       Area Code 423 Phone# [phone] * DOB [phone]    RE PT WANTS TO RESCHEDULE AN APPT                                                                         Disp:Y/N N If Y = C/B If No Response In 54minutes ============================================================ Lt v/m pt need to call the office to r/s appt.

## 2015-08-31 NOTE — Telephone Encounter (Signed)
Patient reports worsening of both arm numbness that started 2 weeks.  An appointment was made for 6/6, But would like to come in earlier. Kylie Calhoun the patient the option to go to the Emergency Room. Patient declined.   Advised that the nurse would call if there was any other questions.

## 2015-08-31 NOTE — Telephone Encounter (Signed)
Returned pt TC. Left VM mssg. Pt may call back for sooner work-in appt.

## 2015-09-02 ENCOUNTER — Inpatient Hospital Stay (HOSPITAL_COMMUNITY): Payer: Medicare Other

## 2015-09-02 ENCOUNTER — Inpatient Hospital Stay (HOSPITAL_COMMUNITY)
Admission: EM | Admit: 2015-09-02 | Discharge: 2015-09-04 | DRG: 065 | Disposition: A | Discharge status: Home / Self Care | Payer: Medicare Other | Attending: Internal Medicine | Admitting: Internal Medicine

## 2015-09-02 ENCOUNTER — Encounter (HOSPITAL_COMMUNITY): Payer: Self-pay

## 2015-09-02 ENCOUNTER — Emergency Department (HOSPITAL_COMMUNITY): Payer: Medicare Other

## 2015-09-02 DIAGNOSIS — R260 Ataxic gait: Secondary | ICD-10-CM | POA: Diagnosis present

## 2015-09-02 DIAGNOSIS — R9082 White matter disease, unspecified: Secondary | ICD-10-CM | POA: Diagnosis present

## 2015-09-02 DIAGNOSIS — C679 Malignant neoplasm of bladder, unspecified: Secondary | ICD-10-CM | POA: Diagnosis present

## 2015-09-02 DIAGNOSIS — E785 Hyperlipidemia, unspecified: Secondary | ICD-10-CM | POA: Diagnosis present

## 2015-09-02 DIAGNOSIS — Z8551 Personal history of malignant neoplasm of bladder: Secondary | ICD-10-CM

## 2015-09-02 DIAGNOSIS — I69398 Other sequelae of cerebral infarction: Secondary | ICD-10-CM

## 2015-09-02 DIAGNOSIS — I69351 Hemiplegia and hemiparesis following cerebral infarction affecting right dominant side: Secondary | ICD-10-CM | POA: Diagnosis not present

## 2015-09-02 DIAGNOSIS — M8580 Other specified disorders of bone density and structure, unspecified site: Secondary | ICD-10-CM | POA: Diagnosis present

## 2015-09-02 DIAGNOSIS — G9389 Other specified disorders of brain: Secondary | ICD-10-CM | POA: Diagnosis present

## 2015-09-02 DIAGNOSIS — I10 Essential (primary) hypertension: Secondary | ICD-10-CM | POA: Diagnosis present

## 2015-09-02 DIAGNOSIS — R209 Unspecified disturbances of skin sensation: Secondary | ICD-10-CM

## 2015-09-02 DIAGNOSIS — Z7902 Long term (current) use of antithrombotics/antiplatelets: Secondary | ICD-10-CM | POA: Diagnosis not present

## 2015-09-02 DIAGNOSIS — G459 Transient cerebral ischemic attack, unspecified: Secondary | ICD-10-CM | POA: Diagnosis present

## 2015-09-02 DIAGNOSIS — R200 Anesthesia of skin: Secondary | ICD-10-CM

## 2015-09-02 DIAGNOSIS — I6789 Other cerebrovascular disease: Secondary | ICD-10-CM | POA: Diagnosis not present

## 2015-09-02 DIAGNOSIS — Z8739 Personal history of other diseases of the musculoskeletal system and connective tissue: Secondary | ICD-10-CM

## 2015-09-02 DIAGNOSIS — Z7982 Long term (current) use of aspirin: Secondary | ICD-10-CM

## 2015-09-02 DIAGNOSIS — Z79899 Other long term (current) drug therapy: Secondary | ICD-10-CM | POA: Diagnosis not present

## 2015-09-02 DIAGNOSIS — I639 Cerebral infarction, unspecified: Secondary | ICD-10-CM | POA: Diagnosis present

## 2015-09-02 DIAGNOSIS — R29700 NIHSS score 0: Secondary | ICD-10-CM | POA: Diagnosis present

## 2015-09-02 DIAGNOSIS — I63531 Cerebral infarction due to unspecified occlusion or stenosis of right posterior cerebral artery: Secondary | ICD-10-CM | POA: Diagnosis not present

## 2015-09-02 DIAGNOSIS — I69898 Other sequelae of other cerebrovascular disease: Secondary | ICD-10-CM | POA: Diagnosis not present

## 2015-09-02 LAB — DIFFERENTIAL
Basophils Absolute: 0 10*3/uL (ref 0.0–0.1)
Basophils Relative: 1 %
EOS PCT: 2 %
Eosinophils Absolute: 0.1 10*3/uL (ref 0.0–0.7)
LYMPHS PCT: 32 %
Lymphs Abs: 2.1 10*3/uL (ref 0.7–4.0)
MONO ABS: 0.6 10*3/uL (ref 0.1–1.0)
Monocytes Relative: 9 %
Neutro Abs: 3.8 10*3/uL (ref 1.7–7.7)
Neutrophils Relative %: 57 %

## 2015-09-02 LAB — COMPREHENSIVE METABOLIC PANEL
ALK PHOS: 65 U/L (ref 38–126)
ALT: 21 U/L (ref 14–54)
ANION GAP: 7 (ref 5–15)
AST: 25 U/L (ref 15–41)
Albumin: 3.6 g/dL (ref 3.5–5.0)
BUN: 13 mg/dL (ref 6–20)
CALCIUM: 9.2 mg/dL (ref 8.9–10.3)
CO2: 25 mmol/L (ref 22–32)
Chloride: 107 mmol/L (ref 101–111)
Creatinine, Ser: 0.89 mg/dL (ref 0.44–1.00)
GFR calc non Af Amer: >60 mL/min (ref >60)
Glucose, Bld: 110 mg/dL — ABNORMAL HIGH (ref 65–99)
Potassium: 3.5 mmol/L (ref 3.5–5.1)
SODIUM: 139 mmol/L (ref 135–145)
TOTAL PROTEIN: 6.3 g/dL — AB (ref 6.5–8.1)
Total Bilirubin: 0.9 mg/dL (ref 0.3–1.2)

## 2015-09-02 LAB — RAPID URINE DRUG SCREEN, HOSP PERFORMED
Amphetamines: NOT DETECTED
Barbiturates: NOT DETECTED
Benzodiazepines: NOT DETECTED
COCAINE: NOT DETECTED
OPIATES: NOT DETECTED
Tetrahydrocannabinol: NOT DETECTED

## 2015-09-02 LAB — CBC
HCT: 42.9 % (ref 36.0–46.0)
Hemoglobin: 14 g/dL (ref 12.0–15.0)
MCH: 28.6 pg (ref 26.0–34.0)
MCHC: 32.6 g/dL (ref 30.0–36.0)
MCV: 87.6 fL (ref 78.0–100.0)
Platelets: 271 10*3/uL (ref 150–400)
RBC: 4.9 MIL/uL (ref 3.87–5.11)
RDW: 13.6 % (ref 11.5–15.5)
WBC: 6.6 10*3/uL (ref 4.0–10.5)

## 2015-09-02 LAB — PROTIME-INR
INR: 1.07 (ref 0.00–1.49)
INR: 0.99 (ref 0.00–1.49)
PROTHROMBIN TIME: 13.3 s (ref 11.6–15.2)
Prothrombin Time: 14.1 seconds (ref 11.6–15.2)

## 2015-09-02 LAB — I-STAT TROPONIN, ED: Troponin i, poc: 0.01 ng/mL (ref 0.00–0.08)

## 2015-09-02 LAB — I-STAT CHEM 8, ED
BUN: 15 mg/dL (ref 6–20)
CALCIUM ION: 1.21 mmol/L (ref 1.13–1.30)
Chloride: 104 mmol/L (ref 101–111)
Creatinine, Ser: 0.8 mg/dL (ref 0.44–1.00)
Glucose, Bld: 102 mg/dL — ABNORMAL HIGH (ref 65–99)
HCT: 45 % (ref 36.0–46.0)
Hemoglobin: 15.3 g/dL — ABNORMAL HIGH (ref 12.0–15.0)
Potassium: 3.5 mmol/L (ref 3.5–5.1)
SODIUM: 144 mmol/L (ref 135–145)
TCO2: 25 mmol/L (ref 0–100)

## 2015-09-02 LAB — ECHOCARDIOGRAM COMPLETE
Height: 63 in
WEIGHTICAEL: 2080 [oz_av]

## 2015-09-02 LAB — TROPONIN I: Troponin I: <0.03 ng/mL (ref <0.031)

## 2015-09-02 LAB — APTT
aPTT: 30 seconds (ref 24–37)
aPTT: 29 seconds (ref 24–37)

## 2015-09-02 MED ORDER — GABAPENTIN 100 MG PO CAPS
200.0000 mg | ORAL_CAPSULE | Freq: Every day | ORAL | Status: DC
  Administered 2015-09-02 – 2015-09-03 (×2): 200 mg via ORAL
  Filled 2015-09-02 (×2): qty 2

## 2015-09-02 MED ORDER — ATORVASTATIN CALCIUM 20 MG PO TABS
20.0000 mg | ORAL_TABLET | Freq: Every day | ORAL | Status: DC
  Administered 2015-09-02 – 2015-09-03 (×2): 20 mg via ORAL
  Filled 2015-09-02: qty 1
  Filled 2015-09-02: qty 2

## 2015-09-02 MED ORDER — CLOPIDOGREL BISULFATE 75 MG PO TABS
75.0000 mg | ORAL_TABLET | Freq: Every day | ORAL | Status: DC
  Administered 2015-09-02 – 2015-09-04 (×3): 75 mg via ORAL
  Filled 2015-09-02 (×3): qty 1

## 2015-09-02 MED ORDER — ACETAMINOPHEN 650 MG RE SUPP: 650.0000 mg | RECTAL | Status: DC | PRN

## 2015-09-02 MED ORDER — STROKE: EARLY STAGES OF RECOVERY BOOK
Freq: Once | Status: DC
  Filled 2015-09-02 (×3): qty 1

## 2015-09-02 MED ORDER — ASPIRIN 325 MG PO TABS: 325.0000 mg | ORAL_TABLET | Freq: Every day | ORAL | Status: DC

## 2015-09-02 MED ORDER — ASPIRIN 300 MG RE SUPP: 300.0000 mg | Freq: Every day | RECTAL | Status: DC

## 2015-09-02 MED ORDER — GABAPENTIN 100 MG PO CAPS
100.0000 mg | ORAL_CAPSULE | Freq: Three times a day (TID) | ORAL | Status: DC
  Administered 2015-09-02 – 2015-09-04 (×6): 100 mg via ORAL
  Filled 2015-09-02 (×6): qty 1

## 2015-09-02 MED ORDER — LORAZEPAM 2 MG/ML IJ SOLN
0.2500 mg | Freq: Once | INTRAMUSCULAR | Status: AC
  Administered 2015-09-02: 0.25 mg via INTRAVENOUS
  Filled 2015-09-02: qty 1

## 2015-09-02 MED ORDER — SENNOSIDES-DOCUSATE SODIUM 8.6-50 MG PO TABS: 1.0000 | ORAL_TABLET | Freq: Every evening | ORAL | Status: DC | PRN

## 2015-09-02 MED ORDER — ACETAMINOPHEN 325 MG PO TABS: 650.0000 mg | ORAL_TABLET | ORAL | Status: DC | PRN

## 2015-09-02 NOTE — Progress Notes (Signed)
  Echocardiogram 2D Echocardiogram has been performed.  Diamond Nickel 09/02/2015, 3:19 PM

## 2015-09-02 NOTE — ED Notes (Signed)
Pt out of room for testing. 

## 2015-09-02 NOTE — Consult Note (Signed)
Requesting Physician: Dr.  Jeanell Sparrow    Reason for consultation:  To get for acute stroke  HPI:                                                                                                                                         Kylie Calhoun is an 65 y.o. female patient who  had an acute left thalamic lacunar infarct in December 2016, and is been noticing intermittent right-sided numbness for the past couple of weeks predominantly in the right upper extremity. This morning when she woke up she is been having some numbness and paresthesias involving her right lower extremity which she reported to be different from her regular numbness symptoms and hence she presented to the emergency room for further evaluation.  Patient denies any vision or speech or weakness symptoms ,no gait instability.   Date last known well:  fluctauting right-sided numbness sx for 2 weeks, Time last known well:  Last night 9 pm when she went to bed, woke up this morning with right-sided numbness symptoms tPA Given: No: non focal exam, NIHSS 0,  Stroke Risk Factors - hypertension  Past Medical History: Past Medical History  Diagnosis Date  . Bladder cancer ([place]) 2011    3 times  . Hx of osteopenia   . Diverticulosis     mild per colonoscopy  . Hemorrhoid     internal per colonoscopy 2016  . Stroke ([place])   . Dyslipidemia   . Ankle fracture, left   . Alteration of sensation as late effect of stroke 05/11/2015    Past Surgical History  Procedure Laterality Date  . 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 Tonsillectomy    . Abdominal hysterectomy    . Appendectomy    . Bladder surgery      scope and removal of tumors    Family History: Family History  Problem Relation Age of Onset  . Heart disease Father   . Stroke Sister   . Hypertension Sister   . Stroke Brother   . Hypertension Brother     Social History:   reports that she has never smoked. She has never used smokeless tobacco.  She reports that she drinks alcohol. She reports that she does not use illicit drugs.  Allergies:  No Known Allergies   Medications:  No current facility-administered medications for this encounter.  Current outpatient prescriptions:  .  aspirin EC 81 MG tablet, Take 81 mg by mouth daily., Disp: , Rfl:  .  atorvastatin (LIPITOR) 20 MG tablet, Take 1 tablet by mouth daily at 6 PM. , Disp: , Rfl: 0 .  clopidogrel (PLAVIX) 75 MG tablet, Take 1 tablet (75 mg total) by mouth daily. (Patient not taking: Reported on 09/02/2015), Disp: 30 tablet, Rfl: 11   ROS:                                                                                                                                       History obtained from the patient  General ROS: negative for - chills, fatigue, fever, night sweats, weight gain or weight loss Psychological ROS: negative for - behavioral disorder, hallucinations, memory difficulties, mood swings or suicidal ideation Ophthalmic ROS: negative for - blurry vision, double vision, eye pain or loss of vision ENT ROS: negative for - epistaxis, nasal discharge, oral lesions, sore throat, tinnitus or vertigo Allergy and Immunology ROS: negative for - hives or itchy/watery eyes Hematological and Lymphatic ROS: negative for - bleeding problems, bruising or swollen lymph nodes Endocrine ROS: negative for - galactorrhea, hair pattern changes, polydipsia/polyuria or temperature intolerance Respiratory ROS: negative for - cough, hemoptysis, shortness of breath or wheezing Cardiovascular ROS: negative for - chest pain, dyspnea on exertion, edema or irregular heartbeat Gastrointestinal ROS: negative for - abdominal pain, diarrhea, hematemesis, nausea/vomiting or stool incontinence Genito-Urinary ROS: negative for - dysuria, hematuria, incontinence or urinary  frequency/urgency Musculoskeletal ROS: negative for - joint swelling or muscular weakness Neurological ROS: as noted in HPI Dermatological ROS: negative for rash and skin lesion changes  Neurologic Examination:                                                                                                    Today's Vitals   09/02/15 1033 09/02/15 1034 09/02/15 1045 09/02/15 1115  BP: 143/77  116/93 147/100  Pulse:  73 67 62  Temp:      TempSrc:      Resp: 15 11 24 15   Height:      Weight:      SpO2:  100% 97% 100%    Evaluation of higher integrative functions including: Level of alertness: Alert,  Oriented to time, place and person Recent and remote memory - intact   Attention span and concentration  - intact   Speech: fluent, no evidence of dysarthria or  aphasia noted.  Test the following cranial nerves: 2-12 grossly intact Motor examination: Normal tone, bulk, full 5/5 motor strength in all 4 extremities Examination of sensation : Normal and symmetric sensation to pinprick in all 4 extremities and on face Examination of deep tendon reflexes: 2+, normal and symmetric in all extremities, normal plantars bilaterally Test coordination: Normal finger nose testing, with no evidence of limb appendicular ataxia or abnormal involuntary movements or tremors noted.  Gait:  Very Wide based, low ground clearance, negative romberg    Lab Results: Basic Metabolic Panel:  Recent Labs Lab 09/02/15 1050 09/02/15 1120  NA 139 144  K 3.5 3.5  CL 107 104  CO2 25  --   GLUCOSE 110* 102*  BUN 13 15  CREATININE 0.89 0.80  CALCIUM 9.2  --     Liver Function Tests:  Recent Labs Lab 09/02/15 1050  AST 25  ALT 21  ALKPHOS 65  BILITOT 0.9  PROT 6.3*  ALBUMIN 3.6   No results for input(s): LIPASE, AMYLASE in the last 168 hours. No results for input(s): AMMONIA in the last 168 hours.  CBC:  Recent Labs Lab 09/02/15 1050 09/02/15 1120  WBC 6.6  --   NEUTROABS 3.8  --   HGB  14.0 15.3*  HCT 42.9 45.0  MCV 87.6  --   PLT 271  --     Cardiac Enzymes: No results for input(s): CKTOTAL, CKMB, CKMBINDEX, TROPONINI in the last 168 hours.  Lipid Panel: No results for input(s): CHOL, TRIG, HDL, CHOLHDL, VLDL, LDLCALC in the last 168 hours.  CBG: No results for input(s): GLUCAP in the last 168 hours.  Microbiology: No results found for this or any previous visit.   Imaging: Ct Head Wo Contrast  09/02/2015  CLINICAL DATA:  Right-sided numbness. EXAM: CT HEAD WITHOUT CONTRAST TECHNIQUE: Contiguous axial images were obtained from the base of the skull through the vertex without intravenous contrast. COMPARISON:  March 15, 2015 FINDINGS: Paranasal sinuses, mastoid air cells, and bones are within normal limits. The extracranial soft tissues are also normal. No subdural, epidural, or subarachnoid hemorrhage is identified. No mass, mass effect, or midline shift. The ventricles and sulci remain prominent but stable. The cerebellum and brainstem are normal. The basal cisterns are patent as well. White matter changes are stable. Low attenuation remains in the left thalamus, stable as well. No acute cortical ischemia or infarct identified. No other acute abnormalities are seen 1 today use study. IMPRESSION: Chronic white matter changes. No acute ischemia, infarct, or bleed identified. Findings called to Dr. Silverio Decamp at 11:20 a.m. Electronically Signed   By: Dorise Bullion III M.D   On: 09/02/2015 11:25     Stroke Scales   NIHSS :  0  [place] Stroke Program Early CT score (ASPECTS) : 10  Pre stroke Modified Rankin Scale (mRS): 0   Assessment and plan:   Kylie Calhoun is an 65 y.o. female patient who presented with intermittent and fluctuating right-sided subjective numbness and paraesthesias which have been going on for the past couple of weeks, woke up this morning with worse paresthesias in the right lower extremity which was different from her prior numbness  symptoms in the past 2 weeks. And she presented to the ER for further evaluation. At the time of my evaluation, she did not have any definite focal neurological symptoms no asymmetry to pinprick on exam. NIHSS 0. Hence not considered an IV TPA candidate.   Due to vascular risk factors and history  of prior left thalamic lacunar infarct, recommend MRI of the brain for further stroke evaluation. At the time of finalizing this note, my study was completed it showed a tiny acute right splenium infarct. Based on the location of this acute infarct, this does not account for patient's symptoms. Moreover I'm not sure if this lesion is symptomatic at all. Nevertheless, she needs further stroke workup and admission to the hospitalist service. She was taking aspirin 81 mg daily at home. Recommend switching to Plavix 75 mg daily, stop aspirin. Recommend echocardiogram, cardiac telemetry monitoring.   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 For symptomatic management, I recommend starting gabapentin 100 mg in the morning, 100 mg after lunch and 300 mg after dinner to avoid sedative side effects during daytime.  Her MRI brain is also suggestive of a possible normal pressure hydrocephalus in the differential given the ventriculomegaly and transependymal CSF flow noted. Clinically patient has a very wide-based ataxic gait with low ground clearance which is typical in patients with NPH, she also did give a history of intermittent resting tremor although could not notice any during my evaluation. She also has some urinary urgency which she attributes to previous bladder surgery due to bladder cancer. Discussed about further evaluation including a spinal tap to see for gait would improve. She would like to discuss this further with her regular outpatient neurologist, Dr. Jannifer Franklin and undergo further  evaluation through his office. Hence will defer this to her regular outpatient neurologist.  Counseling about falls prevention, and advised her to use a cane at all times  Neurology stroke team will continue to follow starting tomorrow morning.

## 2015-09-02 NOTE — ED Notes (Signed)
Patient transported to Echo.

## 2015-09-02 NOTE — ED Notes (Signed)
Patient here with 1 week of intermittent bilateral arm weakness/numbness. Reports had stroke back in December and has some right sided residual from same

## 2015-09-02 NOTE — H&P (Signed)
History and Physical    Kylie Calhoun [id number] DOB: Apr 07, 1951 DOA: 09/02/2015   PCP: Kylie Redwood, MD   Patient coming from:  Home   Chief Complaint: Right arm weakness   HPI: Kylie Calhoun is a 65 y.o. female with medical history significant for left thalamic lacunar infarct likely from the proximal atherosclerotic changes of the left posterior cerebral artery,requiring hospitalization from 12/5 through 03/21/2015, discharge on Plavix and aspirin, now not on Plavix since March 2017,HLD, prior history of bladder cancer, osteopenia, presenting with 2 week history of intermittent right arm weakness, with some numbness worse around 9 pm last night, for which she presented to the emergency department for further evaluation. Symptoms were present when patient woke up this morning. Denies vertigo dizziness or vision changes. Denies headaches. No dysarthria. No dysphagia. No confusion. Denies any chest pain, or shortness of breath. Denies any fever or chills, or night sweats.Does not smoke. No new meds or hormonal supplements. She is compliant with her medications.  Denies any recent long distance trips. No recent surgeries. No sick contacts. New stressors present at work and in personal life.    ED Course:  BP 138/79 mmHg  Pulse 70  Temp(Src) 98.8 F (37.1 C) (Oral)  Resp 15  Ht 5\' 3"  (1.6 m)  Wt 58.968 kg (130 lb)  BMI 23.03 kg/m2  SpO2 98%  CT of the head without contrast on 09/02/2014 shows chronic white matter changes, but no acute ischemia, infarcts, or bleeding. 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 She was not initiated on TPA. She was placed on gabapentin 18  TID while awaiting for MRI results MRI brain now shows Acute/subacute punctate infarct involving the splenium of the corpus callosum on the right corresponds with symptoms beginning several days ago. . No other  acute intracranial abnormality  Stable atrophy and white matter disease reflects the sequela of chronic microvascular ischemia, moderately advanced for age.  Expected evolution of nonhemorrhagic infarct involving the left thalamus. Glucose 102. CBC is similar to her unremarkable.   Review of Systems: As per HPI otherwise 10 point review of systems negative.     Past Medical History  Diagnosis Date  . Bladder cancer ([place]) 2011    3 times  . Hx of osteopenia   . Diverticulosis     mild per colonoscopy  . Hemorrhoid     internal per colonoscopy 2016  . Stroke ([place])   . Dyslipidemia   . Ankle fracture, left   . Alteration of sensation as late effect of stroke 05/11/2015    Past Surgical History  Procedure Laterality Date  . 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 Tonsillectomy    . Abdominal hysterectomy    . Appendectomy    . Bladder surgery      scope and removal of tumors  . Refractive surgery       reports that she has never smoked. She has never used smokeless tobacco. She reports that she drinks alcohol. She reports that she does not use illicit drugs. Walks with cane   No Known Allergies  Family History  Problem Relation Age of Onset  . Heart disease Father   . Stroke Sister   . Hypertension Sister   . Stroke Brother   . Hypertension Brother       Prior to Admission medications   Medication  Sig Start Date End Date Taking? Authorizing Provider  aspirin EC 81 MG tablet Take 81 mg by mouth daily.   Yes Historical Provider, MD  atorvastatin (LIPITOR) 20 MG tablet Take 1 tablet by mouth daily at 6 PM.  03/21/15  Yes Historical Provider, MD  clopidogrel (PLAVIX) 75 MG tablet Take 1 tablet (75 mg total) by mouth daily. Patient not taking: Reported on 09/02/2015 03/21/15   Rosalin Hawking, MD    Physical Exam:    Filed Vitals:   09/02/15 1232 09/02/15 1233 09/02/15 1234 09/02/15 1245  BP: 148/85   138/79  Pulse:   70 70  Temp:      TempSrc:        Resp:  15 10 15   Height:      Weight:      SpO2:   100% 98%      Constitutional: NAD, calm, comfortable Filed Vitals:   09/02/15 1232 09/02/15 1233 09/02/15 1234 09/02/15 1245  BP: 148/85   138/79  Pulse:   70 70  Temp:      TempSrc:      Resp:  15 10 15   Height:      Weight:      SpO2:   100% 98%   Eyes: PERRL, lids and conjunctivae normal ENMT: Mucous membranes are moist. Posterior pharynx clear of any exudate or lesions.Normal dentition.  Neck: normal, supple, no masses, no thyromegaly Respiratory: clear to auscultation bilaterally, no wheezing, no crackles. Normal respiratory effort. No accessory muscle use.  Cardiovascular: Regular rate and rhythm, no murmurs / rubs / gallops. No extremity edema. 2+ pedal pulses. No carotid bruits.  Abdomen: no tenderness, no masses palpated. No hepatosplenomegaly. Bowel sounds positive.  Musculoskeletal: no clubbing / cyanosis. No joint deformity upper and lower extremities. Good ROM, no contractures. Normal muscle tone.  Skin: no rashes, lesions, ulcers. No induration Neurologic: CN 2-12 grossly intact. Sensation intact, DTR normal. Strength 5/5 in all 4.  Psychiatric: Normal judgment and insight. Alert and oriented x 3. Normal mood.     Labs on Admission: I have personally reviewed following labs and imaging studies  CBC:  Recent Labs Lab 09/02/15 1050 09/02/15 1120  WBC 6.6  --   NEUTROABS 3.8  --   HGB 14.0 15.3*  HCT 42.9 45.0  MCV 87.6  --   PLT 271  --     Basic Metabolic Panel:  Recent Labs Lab 09/02/15 1050 09/02/15 1120  NA 139 144  K 3.5 3.5  CL 107 104  CO2 25  --   GLUCOSE 110* 102*  BUN 13 15  CREATININE 0.89 0.80  CALCIUM 9.2  --     GFR: Estimated Creatinine Clearance: 57.2 mL/min (by C-G formula based on Cr of 0.8).  Liver Function Tests:  Recent Labs Lab 09/02/15 1050  AST 25  ALT 21  ALKPHOS 65  BILITOT 0.9  PROT 6.3*  ALBUMIN 3.6   No results for input(s): LIPASE, AMYLASE in  the last 168 hours. No results for input(s): AMMONIA in the last 168 hours.  Coagulation Profile:  Recent Labs Lab 09/02/15 1050  INR 1.07    Cardiac Enzymes: No results for input(s): CKTOTAL, CKMB, CKMBINDEX, TROPONINI in the last 168 hours.  BNP (last 3 results) No results for input(s): PROBNP in the last 8760 hours.  HbA1C: No results for input(s): HGBA1C in the last 72 hours.  CBG: No results for input(s): GLUCAP in the last 168 hours.  Lipid Profile: No results for  input(s): CHOL, HDL, LDLCALC, TRIG, CHOLHDL, LDLDIRECT in the last 72 hours.  Thyroid Function Tests: No results for input(s): TSH, T4TOTAL, FREET4, T3FREE, THYROIDAB in the last 72 hours.  Anemia Panel: No results for input(s): VITAMINB12, FOLATE, FERRITIN, TIBC, IRON, RETICCTPCT in the last 72 hours.  Urine analysis:    Component Value Date/Time   COLORURINE YELLOW 03/24/2015 1210   APPEARANCEUR CLOUDY* 03/24/2015 1210   LABSPEC 1.005 03/24/2015 1210   PHURINE 6.0 03/24/2015 1210   GLUCOSEU NEGATIVE 03/24/2015 1210   HGBUR TRACE* 03/24/2015 1210   BILIRUBINUR NEGATIVE 03/24/2015 1210   KETONESUR NEGATIVE 03/24/2015 1210   PROTEINUR NEGATIVE 03/24/2015 1210   UROBILINOGEN 0.2 04/21/2012 1147   NITRITE NEGATIVE 03/24/2015 1210   LEUKOCYTESUR LARGE* 03/24/2015 1210    Sepsis Labs: @LABRCNTIP (procalcitonin:4,lacticidven:4) )No results found for this or any previous visit (from the past 240 hour(s)).   Radiological Exams on Admission: Ct Head Wo Contrast  09/02/2015  CLINICAL DATA:  Right-sided numbness. EXAM: CT HEAD WITHOUT CONTRAST TECHNIQUE: Contiguous axial images were obtained from the base of the skull through the vertex without intravenous contrast. COMPARISON:  March 15, 2015 FINDINGS: Paranasal sinuses, mastoid air cells, and bones are within normal limits. The extracranial soft tissues are also normal. No subdural, epidural, or subarachnoid hemorrhage is identified. No mass, mass  effect, or midline shift. The ventricles and sulci remain prominent but stable. The cerebellum and brainstem are normal. The basal cisterns are patent as well. White matter changes are stable. Low attenuation remains in the left thalamus, stable as well. No acute cortical ischemia or infarct identified. No other acute abnormalities are seen 1 today use study. IMPRESSION: Chronic white matter changes. No acute ischemia, infarct, or bleed identified. Findings called to Dr. Silverio Decamp at 11:20 a.m. Electronically Signed   By: Dorise Bullion III M.D   On: 09/02/2015 11:25   Mr Brain Wo Contrast  09/02/2015  CLINICAL DATA:  One-week history bilateral upper extremity numbness and right lower extremity numbness. Left-sided symptoms were worse today. Personal history of left thalamic infarct 03/19/2015. EXAM: MRI HEAD WITHOUT CONTRAST TECHNIQUE: Multiplanar, multiecho pulse sequences of the brain and surrounding structures were obtained without intravenous contrast. COMPARISON:  CT head without contrast 09/02/2015. MRI brain 03/24/2015. FINDINGS: Focal diffusion signal abnormality is present along the right side of the splenium of the corpus callosum. The ADC map is isointense at this point. Moderate generalized atrophy and diffuse white matter disease is present bilaterally. The ventricles are proportionate to the degree of atrophy. No significant extra-axial fluid collection is present. Focal T2 signal changes are associated with the lesion in the splenium of the corpus callosum. These T2 changes were not present on the December study. The internal auditory canals are normal bilaterally. Mild white matter changes in the brainstem are stable. Remote lacunar infarcts of the cerebellum are stable. The left thalamic infarct has evolved. Flow is present in the major intracranial arteries. The globes and orbits are intact. The skullbase is within normal limits. Midline sagittal images are unremarkable. Diffuse confluent  periventricular white matter disease is otherwise stable. IMPRESSION: 1. Acute/subacute punctate infarct involving the splenium of the corpus callosum on the right corresponds with symptoms beginning several days ago. 2. No other acute intracranial abnormality. 3. Stable atrophy and white matter disease reflects the sequela of chronic microvascular ischemia, moderately advanced for age. 4. Expected evolution of nonhemorrhagic infarct involving the left thalamus. Electronically Signed   By: San Morelle M.D.   On: 09/02/2015 13:01  EKG: Independently reviewed.  Assessment/Plan Active Problems:   TIA (transient ischemic attack)   Hx of osteopenia   Malignant neoplasm of urinary bladder (HCC)   Stroke [facility])   Stroke with cerebral ischemia (HCC)   HLD (hyperlipidemia)   Alteration of sensation as late effect of stroke   Acute- intermittent right sided weakness with intermitent numbness/History of stroke/Acute CVA.   Not a TPA candidate as symptoms are not present at this time, low NIH -MRI brain consistent with Acute/subacute punctate infarct involving the splenium of the corpus callosum on the right corresponds with symptoms beginning several days ago. 2. No other acute intracranial abnormality.  Expected evolution of nonhemorrhagic infarct involving the left thalamus.  Recent 2 D echo in 123XX123  Normal systolic.  EF 60% to 65%. (grade 1 diastolic dysfunction). Last A1C 03/2015 5.9 -Tele / Inpatient Allow permissive HTN -Neuro following  -PT/OT/SLP Carotis ultrasound  lipid panel Repeat 2 D echo to rule out cardio-embolic source Plavix per Neuro recommendation Continue Neurontin 100 mg in am, 100 mg noon, 300 mg pm.   Hyperlipidemia Continue home statins   DVT prophylaxis:  Neuro recommends Plavix only per discussion with Dr. Silverio Decamp Code Status:   Full   Family Communication:  Discussed with family Disposition Plan: Expect patient to be discharged to home after condition  improves Consults called:    None Admission status: Inpatient  Tele    [facility] E, PA-C [facility]   If 7PM-7AM, please contact night-coverage [email or web address] Password [email or web address]  09/02/2015, 2:07 PM

## 2015-09-02 NOTE — ED Notes (Signed)
[facility] contacted to cancel Code Stroke 

## 2015-09-02 NOTE — ED Notes (Signed)
Code Stroke paged out @ 1052.

## 2015-09-02 NOTE — ED Provider Notes (Signed)
CSN: [id number]     Arrival date & time 09/02/15  1005 History   First MD Initiated Contact with Patient 09/02/15 1021     Chief Complaint  Patient presents with  . Numbness   Right side weakness HPI 10 are female presents today complaining of right-sided weakness that began at approximately 7:30 this morning. She awoke at 07 100 and was normal. My evaluation was at 7:15. She states she has had some numbness coming and going for several weeks. She had a stroke in December with some residual symptoms. She states that she was told this was an ischemic stroke.  Review of MR from December reveals a left lacunar infarct. Patient denies visual changes, difficulty speaking or swallowing, she has baseline ataxia. Past Medical History  Diagnosis Date  . Bladder cancer ([place]) 2011    3 times  . Hx of osteopenia   . Diverticulosis     mild per colonoscopy  . Hemorrhoid     internal per colonoscopy 2016  . Stroke ([place])   . Dyslipidemia   . Ankle fracture, left   . Alteration of sensation as late effect of stroke 05/11/2015   Past Surgical History  Procedure Laterality Date  . 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 Tonsillectomy    . Abdominal hysterectomy    . Appendectomy    . Bladder surgery      scope and removal of tumors  . Refractive surgery     Family History  Problem Relation Age of Onset  . Heart disease Father   . Stroke Sister   . Hypertension Sister   . Stroke Brother   . Hypertension Brother    Social History  Substance Use Topics  . Smoking status: Never Smoker   . Smokeless tobacco: Never Used  . Alcohol Use: Yes     Comment: Occasional    OB History    Gravida Para Term Preterm AB TAB SAB Ectopic Multiple Living   3              Review of Systems    Allergies  Review of patient's allergies indicates no known allergies.  Home Medications   Prior to Admission medications   Medication Sig Start Date End Date Taking? Authorizing  Provider  aspirin EC 81 MG tablet Take 81 mg by mouth daily.   Yes Historical Provider, MD  atorvastatin (LIPITOR) 20 MG tablet Take 1 tablet by mouth daily at 6 PM.  03/21/15  Yes Historical Provider, MD  clopidogrel (PLAVIX) 75 MG tablet Take 1 tablet (75 mg total) by mouth daily. Patient not taking: Reported on 09/02/2015 03/21/15   Rosalin Hawking, MD   BP 148/85 mmHg  Pulse 70  Temp(Src) 98.8 F (37.1 C) (Oral)  Resp 10  Ht 5\' 3"  (1.6 m)  Wt 58.968 kg  BMI 23.03 kg/m2  SpO2 100% Physical Exam  Constitutional: She is oriented to person, place, and time. She appears well-developed and well-nourished.  HENT:  Head: Normocephalic and atraumatic.  Right Ear: External ear normal.  Left Ear: External ear normal.  Nose: Nose normal.  Mouth/Throat: Oropharynx is clear and moist.  Eyes: Conjunctivae and EOM are normal. Pupils are equal, round, and reactive to light.  Neck: Normal range of motion. Neck supple.  Cardiovascular: Normal rate, regular rhythm, normal heart sounds and intact distal pulses.   Pulmonary/Chest: Effort normal and breath sounds normal.  Abdominal: Soft. Bowel sounds are normal.  Musculoskeletal: Normal range of motion.  Neurological: She is alert and oriented to person, place, and time. She has normal reflexes.  No palmar drift noted No change in facial sensation Cranial nerves intact Lower extremities strength 5 out of 5 bilaterally. Patient not ambulated  Skin: Skin is warm and dry.  Psychiatric: She has a normal mood and affect. Her behavior is normal. Judgment and thought content normal.  Nursing note and vitals reviewed.   ED Course  Procedures (including critical care time) Labs Review Labs Reviewed  COMPREHENSIVE METABOLIC PANEL - Abnormal; Notable for the following:    Glucose, Bld 110 (*)    Total Protein 6.3 (*)    All other components within normal limits  I-STAT CHEM 8, ED - Abnormal; Notable for the following:    Glucose, Bld 102 (*)     Hemoglobin 15.3 (*)    All other components within normal limits  PROTIME-INR  APTT  CBC  DIFFERENTIAL  URINE RAPID DRUG SCREEN, HOSP PERFORMED  I-STAT TROPOININ, ED  CBG MONITORING, ED    Imaging Review Ct Head Wo Contrast  09/02/2015  CLINICAL DATA:  Right-sided numbness. EXAM: CT HEAD WITHOUT CONTRAST TECHNIQUE: Contiguous axial images were obtained from the base of the skull through the vertex without intravenous contrast. COMPARISON:  March 15, 2015 FINDINGS: Paranasal sinuses, mastoid air cells, and bones are within normal limits. The extracranial soft tissues are also normal. No subdural, epidural, or subarachnoid hemorrhage is identified. No mass, mass effect, or midline shift. The ventricles and sulci remain prominent but stable. The cerebellum and brainstem are normal. The basal cisterns are patent as well. White matter changes are stable. Low attenuation remains in the left thalamus, stable as well. No acute cortical ischemia or infarct identified. No other acute abnormalities are seen 1 today use study. IMPRESSION: Chronic white matter changes. No acute ischemia, infarct, or bleed identified. Findings called to Dr. Silverio Decamp at 11:20 a.m. Electronically Signed   By: Dorise Bullion III M.D   On: 09/02/2015 11:25   I have personally reviewed and evaluated these images and lab results as part of my medical decision-making.   EKG Interpretation   Date/Time:  Sunday Sep 02 2015 12:34:31 EDT Ventricular Rate:  73 PR Interval:  123 QRS Duration: 91 QT Interval:  418 QTC Calculation: 461 R Axis:   59 Text Interpretation:  Normal sinus rhythm Normal ECG Confirmed by Cicily Bonano MD,  Andee Poles [phone]) on 09/02/2015 12:55:04 PM Also confirmed by Zyliah Schier MD,  Andee Poles [phone]), editor Gilford Rile, CCT, [place] ([id number])  on 09/02/2015 1:10:37  PM      MDM   Final diagnoses:  Numbness  Stroke (cerebrum) ([place])   65 year old female with stroke in December who presents with new right sided weakness  although no obvious weakness seen on exam. Code stroke called and Dr. Silverio Decamp saw patient in ED. He canceled code stroke and does not feel that she is a TPA candidate due to low stroke score. Patient had MRI which IMPRESSION: 1. Acute/subacute punctate infarct involving the splenium of the corpus callosum on the right corresponds with symptoms beginning several days ago.   Pattricia Boss, MD 09/02/15 [phone]

## 2015-09-03 ENCOUNTER — Inpatient Hospital Stay (HOSPITAL_COMMUNITY): Payer: Medicare Other

## 2015-09-03 ENCOUNTER — Encounter (HOSPITAL_COMMUNITY): Payer: Self-pay | Admitting: *Deleted

## 2015-09-03 DIAGNOSIS — I69898 Other sequelae of other cerebrovascular disease: Secondary | ICD-10-CM

## 2015-09-03 DIAGNOSIS — E785 Hyperlipidemia, unspecified: Secondary | ICD-10-CM

## 2015-09-03 DIAGNOSIS — C679 Malignant neoplasm of bladder, unspecified: Secondary | ICD-10-CM

## 2015-09-03 DIAGNOSIS — I63531 Cerebral infarction due to unspecified occlusion or stenosis of right posterior cerebral artery: Secondary | ICD-10-CM

## 2015-09-03 DIAGNOSIS — Z8739 Personal history of other diseases of the musculoskeletal system and connective tissue: Secondary | ICD-10-CM

## 2015-09-03 LAB — LIPID PANEL
CHOL/HDL RATIO: 3.2 ratio
CHOLESTEROL: 141 mg/dL (ref 0–200)
HDL: 44 mg/dL (ref >40)
LDL Cholesterol: 68 mg/dL (ref 0–99)
TRIGLYCERIDES: 146 mg/dL (ref <150)
VLDL: 29 mg/dL (ref 0–40)

## 2015-09-03 MED ORDER — ZOLPIDEM TARTRATE 5 MG PO TABS
5.0000 mg | ORAL_TABLET | Freq: Once | ORAL | Status: AC
  Administered 2015-09-03: 5 mg via ORAL
  Filled 2015-09-03: qty 1

## 2015-09-03 MED ORDER — ASPIRIN 81 MG PO CHEW
81.0000 mg | CHEWABLE_TABLET | Freq: Every day | ORAL | Status: DC
  Administered 2015-09-04: 81 mg via ORAL
  Filled 2015-09-03: qty 1

## 2015-09-03 MED ORDER — IOPAMIDOL (ISOVUE-370) INJECTION 76%
INTRAVENOUS | Status: AC
Start: 2015-09-03 — End: 2015-09-03
  Administered 2015-09-03: 50 mL
  Filled 2015-09-03: qty 50

## 2015-09-03 NOTE — Progress Notes (Signed)
Occupational Therapy Evaluation Patient Details Name: Kylie Calhoun MRN: [id number] DOB: December 22, 1950 Today's Date: 09/03/2015    History of Present Illness Kylie Calhoun is a 65 y.o. female with medical history significant for left thalamic lacunar infarct likely from the proximal atherosclerotic changes of the left posterior cerebral artery,requiring hospitalization from 12/5 through 03/21/2015, discharge on Plavix and aspirin, now not on Plavix since March 2017,HLD, prior history of bladder cancer, osteopenia, presenting with 2 week history of intermittent right arm weakness, with some numbness worse around 9 pm last night, for which she presented to the emergency department for further evaluation   Clinical Impression   PTA, pt lived alone and was independent with ADL and mobility. Feel pt will benefit form outpt OT at the neuro outpt center to maximize functional level of independence with ADL and IADL tasks. Pt in agreement. Will follow up with pt in am to establish HEP for fine motor, coordination and strengthening activities.     Follow Up Recommendations  Outpatient OT;Supervision - Intermittent    Equipment Recommendations  None recommended by OT    Recommendations for Other Services       Precautions / Restrictions Precautions Precautions: Fall Restrictions Weight Bearing Restrictions: No      Mobility Bed Mobility Overal bed mobility: Independent                Transfers Overall transfer level: Modified independent                    Balance Overall balance assessment: No apparent balance deficits (not formally assessed)  Wide BOS when standing           Standing balance-Leahy Scale: Fair                              ADL Overall ADL's : Needs assistance/impaired                                     Functional mobility during ADLs: Modified independent (without RW. S wtih RW) General ADL Comments: Pt completing  ADL close to baeline. Pt discussing visual changes with cataracts. Educated pt on compensatory techniques for low vision. Pt discussed concerns with IADL taqsks.Recommended pt use shower seat when bathing. also discussed use o f"home alert system" given she lives by herself. Educated pt on signs and symptoms of CVA.     Vision Vision Assessment?: No apparent visual deficits Additional Comments: from CVA  Discussed possible environmental modifications to accommodate for low vision. Pt verbalized understanding.    Perception     Praxis      Pertinent Vitals/Pain Pain Assessment: No/denies pain     Hand Dominance Right   Extremity/Trunk Assessment Upper Extremity Assessment Upper Extremity Assessment: RUE deficits/detail RUE Deficits / Details: higher level fine motor and coordinatioin deficits. Min difficulty with in hand manipulation skills RUE Sensation: decreased light touch RUE Coordination: decreased fine motor   Lower Extremity Assessment Lower Extremity Assessment: Defer to PT evaluation   Cervical / Trunk Assessment Cervical / Trunk Assessment: Normal   Communication Communication Communication: No difficulties   Cognition Arousal/Alertness: Awake/alert Behavior During Therapy: WFL for tasks assessed/performed Overall Cognitive Status: Within Functional Limits for tasks assessed                     General Comments  Exercises       Shoulder Instructions      Home Living Family/patient expects to be discharged to:: Private residence Living Arrangements: Alone Available Help at Discharge: Family;Available PRN/intermittently Type of Home: House Home Access: Stairs to enter [employer] of Steps: 2   Home Layout: Two level;Bed/bath upstairs Alternate Level Stairs-Number of Steps: 1 flight   Bathroom Shower/Tub: Occupational psychologist: Standard Bathroom Accessibility: Yes How Accessible: Accessible via walker Home  Equipment: Shower seat - built in          Prior Functioning/Environment Level of Independence: Independent        Comments: drives    OT Diagnosis: Generalized weakness   OT Problem List: Decreased strength;Impaired balance (sitting and/or standing);Decreased coordination;Decreased safety awareness;Impaired UE functional use   OT Treatment/Interventions: Self-care/ADL training;Therapeutic exercise;DME and/or AE instruction;Therapeutic activities;Patient/family education;Balance training    OT Goals(Current goals can be found in the care plan section) Acute Rehab OT Goals Patient Stated Goal: To go home, continue to work on getting stronger. OT Goal Formulation: With patient Time For Goal Achievement: 09/10/15 Potential to Achieve Goals: Good ADL Goals Pt/caregiver will Perform Home Exercise Program: With written HEP provided;Right Upper extremity;With theraputty;With Supervision (fine motor coordination HEP)  OT Frequency: Min 2X/week   Barriers to D/C:            Co-evaluation              End of Session Equipment Utilized During Treatment: Gait belt Nurse Communication: Mobility status;Other (comment) (need for follow up outpt OT wtih CM)  Activity Tolerance: Patient tolerated treatment well Patient left: in bed;with call bell/phone within reach   Time: 1537-1556 OT Time Calculation (min): 19 min Charges:  OT General Charges $OT Visit: 1 Procedure OT Evaluation $OT Eval Moderate Complexity: 1 Procedure G-Codes:    Keiri Solano,HILLARY 09-07-15, 4:31 PM   [facility], OTR/L  [phone] 2015/09/07

## 2015-09-03 NOTE — Progress Notes (Signed)
STROKE TEAM PROGRESS NOTE   HISTORY OF PRESENT ILLNESS (per record) Kylie Calhoun is an 65 y.o. female patient who had an acute left thalamic lacunar infarct in December 2016, and is been noticing intermittent right-sided numbness for the past couple of weeks predominantly in the right upper extremity. This morning when she woke up she is been having some numbness and paresthesias involving her right lower extremity which she reported to be different from her regular numbness symptoms and hence she presented to the emergency room for further evaluation. Patient denies any vision or speech or weakness symptoms, no gait instability. She was last known well 09/01/2015 at 9 pm. Patient was not administered IV t-PA secondary to non focal exam, NIHSS 0. She was admitted for further evaluation and treatment.   SUBJECTIVE (INTERVAL HISTORY) Her daughter is at the bedside.  Overall she feels her condition is rapidly improving. She stated that she has for the last 2 weeks with intermittent left and right arm numbness, comes and goes, alternating from left to right. Currently free of symptoms.    OBJECTIVE Temp:  [97.6 F (36.4 C)-98.8 F (37.1 C)] 98.1 F (36.7 C) (05/22 0400) Pulse Rate:  [62-85] 69 (05/22 0400) Cardiac Rhythm:  [-] Normal sinus rhythm (05/22 0200) Resp:  [10-24] 16 (05/22 0400) BP: (115-154)/(63-100) 119/71 mmHg (05/22 0400) SpO2:  [96 %-100 %] 99 % (05/22 0400) Weight:  [56.745 kg (125 lb 1.6 oz)-58.968 kg (130 lb)] 56.745 kg (125 lb 1.6 oz) (05/22 0400)  CBC:  Recent Labs Lab 09/02/15 1050 09/02/15 1120  WBC 6.6  --   NEUTROABS 3.8  --   HGB 14.0 15.3*  HCT 42.9 45.0  MCV 87.6  --   PLT 271  --     Basic Metabolic Panel:  Recent Labs Lab 09/02/15 1050 09/02/15 1120  NA 139 144  K 3.5 3.5  CL 107 104  CO2 25  --   GLUCOSE 110* 102*  BUN 13 15  CREATININE 0.89 0.80  CALCIUM 9.2  --     Lipid Panel:    Component Value Date/Time   CHOL 141 09/03/2015 0452    TRIG 146 09/03/2015 0452   HDL 44 09/03/2015 0452   CHOLHDL 3.2 09/03/2015 0452   VLDL 29 09/03/2015 0452   LDLCALC 68 09/03/2015 0452   HgbA1c:  Lab Results  Component Value Date   HGBA1C 5.9* 03/19/2015   Urine Drug Screen:    Component Value Date/Time   LABOPIA NONE DETECTED 09/02/2015 1859   COCAINSCRNUR NONE DETECTED 09/02/2015 1859   LABBENZ NONE DETECTED 09/02/2015 1859   AMPHETMU NONE DETECTED 09/02/2015 1859   THCU NONE DETECTED 09/02/2015 1859   LABBARB NONE DETECTED 09/02/2015 1859      IMAGING I have personally reviewed the radiological images below and agree with the radiology interpretations.  Dg Chest 2 View 09/02/2015   No evidence of acute cardiopulmonary disease.   Ct Head Wo Contrast 09/02/2015   Chronic white matter changes. No acute ischemia, infarct, or bleed identified.    Ct Angio Head & Neck W/cm &/or Wo/cm 09/03/2015  No extracranial flow reducing stenosis. No intracranial large vessel stenosis or occlusion. Stable mild irregularity of the LEFT PCA at the P1/P2 junction. Chronic changes as described similar to prior MR. The acute subcentimeter infarction of the splenium identified on yesterday's study is not visible on CT.   Mr Brain Wo Contrast 09/02/2015  1. Acute/subacute punctate infarct involving the splenium of the corpus callosum on the  right corresponds with symptoms beginning several days ago. 2. No other acute intracranial abnormality. 3. Stable atrophy and white matter disease reflects the sequela of chronic microvascular ischemia, moderately advanced for age. 4. Expected evolution of nonhemorrhagic infarct involving the left thalamus.   2D Echocardiogram  - Left ventricle: The cavity size was normal. Wall thickness was normal. Systolic function was normal. The estimated ejection fraction was in the range of 60% to 65%. Wall motion was normal;  there were no regional wall motion abnormalities. Left  ventricular diastolic function parameters  were normal.  - Aortic valve: Valve area (VTI): 1.85 cm^2. Valve area (Vmax): 1.76 cm^2. - Technically adequate study.   PHYSICAL EXAM  Temp:  [97.7 F (36.5 C)-98.2 F (36.8 C)] 98.2 F (36.8 C) (05/22 1631) Pulse Rate:  [65-86] 81 (05/22 1631) Resp:  [16-18] 18 (05/22 1631) BP: (115-146)/(63-88) 119/88 mmHg (05/22 1631) SpO2:  [96 %-100 %] 100 % (05/22 1631) Weight:  [125 lb 1.6 oz (56.745 kg)] 125 lb 1.6 oz (56.745 kg) (05/22 0400)  General - Well nourished, well developed, in no apparent distress.  Ophthalmologic - Fundi not visualized due to eye movement.  Cardiovascular - Regular rate and rhythm with no murmur.  Mental Status -  Level of arousal and orientation to time, place, and person were intact. Language including expression, naming, repetition, comprehension was assessed and found intact. Fund of Knowledge was assessed and was intact.  Cranial Nerves II - XII - II - Visual field intact OU. III, IV, VI - Extraocular movements intact. V - Facial sensation intact bilaterally. VII - Facial movement intact bilaterally. VIII - Hearing & vestibular intact bilaterally. X - Palate elevates symmetrically. XI - Chin turning & shoulder shrug intact bilaterally. XII - Tongue protrusion intact.  Motor Strength - The patient's strength was normal in all extremities and pronator drift was absent.  Bulk was normal and fasciculations were absent.   Motor Tone - Muscle tone was assessed at the neck and appendages and was normal.  Reflexes - The patient's reflexes were 1+ in all extremities and she had no pathological reflexes.  Sensory - Light touch, temperature/pinprick were assessed and were symmetrical.    Coordination - The patient had normal movements in the hands and feet with no ataxia or dysmetria.  Tremor was absent.  Gait and Station - broad based gait, no magnetic gait or shuffling gait.   ASSESSMENT/PLAN [name] is a 65 y.o. 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 She did not receive IV t-PA due to nonfocal exam and NIHSS 0.   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 plavix stopped in March. On aspirin 81 mg daily prior to admission, now on clopidogrel 75 mg daily. Due to intracranial stenosis, recommend DAPT for 3 months and then  plavix alone.  Patient counseled to be compliant with her antithrombotic medications  Ongoing aggressive stroke risk factor management  Therapy recommendations:  OP PT  Disposition:  Return home with OP therapies  Followed by Dr. Jannifer Franklin as an OP. Follow up with him after discharge.  No evidence of NPH  MRI showed ventriculometry, however, significant brain atrophy, indicating ex vacu  No cognitive impairment or memory loss as per pt and daughter  Rare urinary incontinence as per pt  No magnetic gait or shuffling gait on exam  If in doubt, consider large CSF tab with pre and post gait and cognitive testing with Dr. Jannifer Franklin as outpt. Will sent note to Dr. Jannifer Franklin.   Hyperlipidemia  Home meds:  lipitor 20, resumed in hospital  LDL 68, goal < 70  Continue statin at discharge  Other Stroke Risk Factors  Advanced age  ETOH use  Hx stroke/TIA  03/2016 repeat R sided numbness, MRI neg for new stroke  03/2016 L thalamic infarct  with R face arm and leg numbness, resultant gait instability  Family hx stroke (sister)  Other Active Problems  Hx bladder cancer with 2 recurrences, currently "cured" as per pt  Hospital day # 1  Neurology will sign off. Please call with questions. Pt will follow up with Dr. Jannifer Franklin at [facility] on 09/18/15. Thanks for the consult.  Rosalin Hawking, MD PhD Stroke Neurology 09/03/2015 7:17 PM   To contact Stroke Continuity provider, please refer to [email or web address] After hours, contact General Neurology

## 2015-09-03 NOTE — Progress Notes (Signed)
Report received via Hetty Ely in patient's room using SBAR format, reviewed VS, labs, tests and patient's general condition, assumed care of patient.

## 2015-09-03 NOTE — Evaluation (Signed)
Physical Therapy Evaluation Patient Details Name: Kylie Calhoun MRN: [id number] DOB: 06/08/1950 Today's Date: 09/03/2015   History of Present Illness  Genevia Volpe is a 65 y.o. female with medical history significant for left thalamic lacunar infarct likely from the proximal atherosclerotic changes of the left posterior cerebral artery,requiring hospitalization from 12/5 through 03/21/2015, discharge on Plavix and aspirin, now not on Plavix since March 2017,HLD, prior history of bladder cancer, osteopenia, presenting with 2 week history of intermittent right arm weakness, with some numbness worse around 9 pm last night, for which she presented to the emergency department for further evaluation  Clinical Impression  Pt presents with decreased balance affecting her mobility and independence. Pt has good strength in Bilateral LEs. Pt ambulated without an assistive device with min guard assist. Pt has an ataxic gait, wide BOS, and decreased balance reactions contributing to a fall risk. Pt was given a RW and demonstrated a much safer gait pattern. Pt is agreeable to use a RW short term for improved safety. Pt lives alone and feel she would be safe to d/c home with a RW. Pt is requesting outpatient PT vs. HHPT. Pt would benefit from continued acute PT to maximize mobility and improve safety and balance until d/c home.    Follow Up Recommendations Outpatient PT    Equipment Recommendations  Rolling walker with 5" wheels    Recommendations for Other Services OT consult     Precautions / Restrictions Precautions Precautions: Fall Restrictions Weight Bearing Restrictions: No      Mobility  Bed Mobility Overal bed mobility: Independent                Transfers Overall transfer level: Modified independent               General transfer comment: verbal cues for hand placement for improved safety.  Ambulation/Gait Ambulation/Gait assistance: Supervision Ambulation Distance  (Feet): 25 Feet Assistive device: None Gait Pattern/deviations: Step-through pattern;Decreased stride length;Ataxic;Wide base of support Gait velocity: decreased Gait velocity interpretation: Below normal speed for age/gender General Gait Details: Decreased fluidity of movment, decreased DF on intial contact left > Right. gave pt a RW and she was able to lengthen her steps, more fluid movment and improved forward weight shift over BOS. Pt was Mod Indep. with RW 200 ft  Stairs            Wheelchair Mobility    Modified Rankin (Stroke Patients Only) Modified Rankin (Stroke Patients Only) Pre-Morbid Rankin Score: No significant disability Modified Rankin: Moderate disability     Balance Overall balance assessment: Needs assistance Sitting-balance support: No upper extremity supported Sitting balance-Leahy Scale: Normal     Standing balance support: No upper extremity supported Standing balance-Leahy Scale: Fair               High level balance activites: Direction changes;Turns;Head turns High Level Balance Comments: no LOB but min guard with balance activities     Dynamic Gait Index Level Surface:  (no AD)       Pertinent Vitals/Pain Pain Assessment: No/denies pain    Home Living Family/patient expects to be discharged to:: Private residence Living Arrangements: Alone Available Help at Discharge: Family;Available PRN/intermittently Type of Home: House Home Access: Stairs to enter   Entrance Stairs-Number of Steps: 2 Home Layout: Two level Home Equipment: None      Prior Function Level of Independence: Independent               Hand Dominance  Extremity/Trunk Assessment   Upper Extremity Assessment: Defer to OT evaluation           Lower Extremity Assessment: Overall WFL for tasks assessed         Communication   Communication: No difficulties  Cognition Arousal/Alertness: Awake/alert Behavior During Therapy: WFL for tasks  assessed/performed Overall Cognitive Status: Within Functional Limits for tasks assessed                      General Comments      Exercises        Assessment/Plan    PT Assessment Patient needs continued PT services  PT Diagnosis Difficulty walking   PT Problem List Decreased balance;Decreased mobility;Decreased knowledge of use of DME  PT Treatment Interventions DME instruction;Gait training;Stair training;Functional mobility training;Therapeutic activities;Therapeutic exercise;Balance training;Patient/family education   PT Goals (Current goals can be found in the Care Plan section) Acute Rehab PT Goals Patient Stated Goal: To go home, continue to work on getting stronger. PT Goal Formulation: With patient Time For Goal Achievement: 09/17/15 Potential to Achieve Goals: Good    Frequency Min 4X/week   Barriers to discharge Decreased caregiver support lives alone    Co-evaluation               End of Session   Activity Tolerance: Patient tolerated treatment well Patient left: in bed;with call bell/phone within reach Nurse Communication: Mobility status         Time: [id number] PT Time Calculation (min) (ACUTE ONLY): 30 min   Charges:   PT Evaluation $PT Eval Moderate Complexity: 1 Procedure PT Treatments $Gait Training: 8-22 mins   PT G Codes:        Lelon Mast 09/03/2015, 8:35 AM

## 2015-09-03 NOTE — Progress Notes (Signed)
Progress Note    Kylie Calhoun  [id number] DOB: October 21, 1950  DOA: 09/02/2015 PCP: Kylie Redwood, MD    Brief Narrative:   Kylie Calhoun is an 65 y.o. female with a PMH of hyperlipidemia, prior bladder cancer, recent hospitalization 03/19/15-03/21/15 for treatment of left thalamus lacunar infarct in the setting of cerebrovascular disease, discharged on aspirin and Plavix but ultimately stopped Plavix March 2017 who was admitted 09/02/15 with a chief complaint of right arm weakness. MRI of the brain showed acute/subacute punctate infarct involving the splenium of the corpus callosum on the right with stable atrophy and white matter disease, moderately advanced for age.  Assessment/Plan:   Principal Problem:   Stroke ([place]) In the setting of moderately advanced cerebrovascular disease/altered sensation as late effect of stroke/Possible normal pressure hydrocephalus Seen by neurology with recommendations to treat with aspirin and Plavix together for at least 3 months. 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 She has been started on gabapentin to address this per neurology recommendations to address this. No need to repeat 2-D echo or carotid Dopplers which were done back in December. Follow-up CT angiogram of the head and neck. Continue aggressive risk factor modification. On a statin to address hyperlipidemia. PT/OT evaluations requested. To address the possible normal pressure hydrocephalus, Dr. Silverio Decamp recommended LP to see if this improves her ataxic gait, however Neurologist recommends that this be done as an outpatient.  Active Problems:   Hx of osteopenia    Malignant neoplasm of urinary bladder (HCC)    HLD (hyperlipidemia) Continue Lipitor. LDL controlled at 68.   Family Communication/Anticipated D/C date and plan/Code Status   DVT prophylaxis: SCDs ordered. Code Status: Full Code.  Family Communication:  Daughter, Kylie Calhoun, updated at the bedside. Disposition Plan: Likely discharge home tomorrow.   Medical Consultants:    Neurology   Procedures:    Anti-Infectives:   Anti-infectives    None      Subjective:   Kylie Calhoun denies current numbness/tingling.  Says she was "out of it" yesterday when asked about LP and is interested in pursuing this.  No headache.    Objective:    Filed Vitals:   09/03/15 0000 09/03/15 0004 09/03/15 0200 09/03/15 0400  BP: 118/79 123/63 115/68 119/71  Pulse: 74 76 85 69  Temp: 97.7 F (36.5 C) 97.8 F (36.6 C)  98.1 F (36.7 C)  TempSrc:  Oral    Resp: 18 18 18 16   Height:      Weight:    56.745 kg (125 lb 1.6 oz)  SpO2: 97% 96% 99% 99%    Intake/Output Summary (Last 24 hours) at 09/03/15 0735 Last data filed at 09/03/15 0400  Gross per 24 hour  Intake    360 ml  Output    850 ml  Net   -490 ml   Filed Weights   09/02/15 1020 09/02/15 1715 09/03/15 0400  Weight: 58.968 kg (130 lb) 57.4 kg (126 lb 8.7 oz) 56.745 kg (125 lb 1.6 oz)    Exam: General exam: Appears calm and comfortable.  Respiratory system: Clear to auscultation. Respiratory effort normal. Cardiovascular system: S1 & S2 heard, RRR. No JVD,  rubs, gallops or clicks. No murmurs. Gastrointestinal system: Abdomen is nondistended, soft and nontender. No organomegaly or masses felt. Normal bowel sounds heard. Central nervous system: Alert and oriented. No focal neurological deficits.Wide-based gait. Extremities: No clubbing, edema, or cyanosis. Skin: No rashes, lesions or ulcers  Psychiatry: Judgement and insight appear normal. Mood & affect appropriate.   Data Reviewed:   I have personally reviewed following labs and imaging studies:  Labs: Basic Metabolic Panel:  Recent Labs Lab 09/02/15 1050 09/02/15 1120  NA 139 144  K 3.5 3.5  CL 107 104  CO2 25  --   GLUCOSE 110* 102*  BUN 13 15  CREATININE 0.89 0.80  CALCIUM 9.2  --    GFR Estimated  Creatinine Clearance: 57.2 mL/min (by C-G formula based on Cr of 0.8). Liver Function Tests:  Recent Labs Lab 09/02/15 1050  AST 25  ALT 21  ALKPHOS 65  BILITOT 0.9  PROT 6.3*  ALBUMIN 3.6   Coagulation profile  Recent Labs Lab 09/02/15 1050 09/02/15 1432  INR 1.07 0.99    CBC:  Recent Labs Lab 09/02/15 1050 09/02/15 1120  WBC 6.6  --   NEUTROABS 3.8  --   HGB 14.0 15.3*  HCT 42.9 45.0  MCV 87.6  --   PLT 271  --    Cardiac Enzymes:  Recent Labs Lab 09/02/15 1432  TROPONINI <0.03   Lipid Profile:  Recent Labs  09/03/15 0452  CHOL 141  HDL 44  LDLCALC 68  TRIG 146  CHOLHDL 3.2   Urine analysis:    Component Value Date/Time   COLORURINE YELLOW 03/24/2015 1210   APPEARANCEUR CLOUDY* 03/24/2015 1210   LABSPEC 1.005 03/24/2015 1210   PHURINE 6.0 03/24/2015 1210   GLUCOSEU NEGATIVE 03/24/2015 1210   HGBUR TRACE* 03/24/2015 1210   BILIRUBINUR NEGATIVE 03/24/2015 1210   KETONESUR NEGATIVE 03/24/2015 1210   PROTEINUR NEGATIVE 03/24/2015 1210   UROBILINOGEN 0.2 04/21/2012 1147   NITRITE NEGATIVE 03/24/2015 1210   LEUKOCYTESUR LARGE* 03/24/2015 1210   Microbiology No results found for this or any previous visit (from the past 240 hour(s)).  Radiology: Dg Chest 2 View  09/02/2015  CLINICAL DATA:  Bilateral arm weakness and numbness EXAM: CHEST  2 VIEW COMPARISON:  03/19/2015 FINDINGS: Lungs are clear.  No pleural effusion or pneumothorax. The heart is normal in size. S-shaped thoracolumbar scoliosis. IMPRESSION: No evidence of acute cardiopulmonary disease. Electronically Signed   By: Julian Hy M.D.   On: 09/02/2015 15:28   Ct Head Wo Contrast  09/02/2015  CLINICAL DATA:  Right-sided numbness. EXAM: CT HEAD WITHOUT CONTRAST TECHNIQUE: Contiguous axial images were obtained from the base of the skull through the vertex without intravenous contrast. COMPARISON:  March 15, 2015 FINDINGS: Paranasal sinuses, mastoid air cells, and bones are within  normal limits. The extracranial soft tissues are also normal. No subdural, epidural, or subarachnoid hemorrhage is identified. No mass, mass effect, or midline shift. The ventricles and sulci remain prominent but stable. The cerebellum and brainstem are normal. The basal cisterns are patent as well. White matter changes are stable. Low attenuation remains in the left thalamus, stable as well. No acute cortical ischemia or infarct identified. No other acute abnormalities are seen 1 today use study. IMPRESSION: Chronic white matter changes. No acute ischemia, infarct, or bleed identified. Findings called to Dr. Silverio Decamp at 11:20 a.m. Electronically Signed   By: Dorise Bullion III M.D   On: 09/02/2015 11:25   Mr Brain Wo Contrast  09/02/2015  CLINICAL DATA:  One-week history bilateral upper extremity numbness and right lower extremity numbness. Left-sided symptoms were worse today. Personal history of left thalamic infarct 03/19/2015. EXAM: MRI HEAD WITHOUT CONTRAST TECHNIQUE: Multiplanar, multiecho pulse sequences of the brain and surrounding structures were obtained without intravenous  contrast. COMPARISON:  CT head without contrast 09/02/2015. MRI brain 03/24/2015. FINDINGS: Focal diffusion signal abnormality is present along the right side of the splenium of the corpus callosum. The ADC map is isointense at this point. Moderate generalized atrophy and diffuse white matter disease is present bilaterally. The ventricles are proportionate to the degree of atrophy. No significant extra-axial fluid collection is present. Focal T2 signal changes are associated with the lesion in the splenium of the corpus callosum. These T2 changes were not present on the December study. The internal auditory canals are normal bilaterally. Mild white matter changes in the brainstem are stable. Remote lacunar infarcts of the cerebellum are stable. The left thalamic infarct has evolved. Flow is present in the major intracranial  arteries. The globes and orbits are intact. The skullbase is within normal limits. Midline sagittal images are unremarkable. Diffuse confluent periventricular white matter disease is otherwise stable. IMPRESSION: 1. Acute/subacute punctate infarct involving the splenium of the corpus callosum on the right corresponds with symptoms beginning several days ago. 2. No other acute intracranial abnormality. 3. Stable atrophy and white matter disease reflects the sequela of chronic microvascular ischemia, moderately advanced for age. 4. Expected evolution of nonhemorrhagic infarct involving the left thalamus. Electronically Signed   By: San Morelle M.D.   On: 09/02/2015 13:01    Medications:   .  stroke: mapping our early stages of recovery book   Does not apply Once  . atorvastatin  20 mg Oral q1800  . clopidogrel  75 mg Oral Daily  . gabapentin  100 mg Oral TID  . gabapentin  200 mg Oral QHS   Continuous Infusions:   Time spent: 35 minutes with > 50% of time discussing current diagnostic test results, clinical impression and plan of care as well as coordination of care with a neurologist.    LOS: 1 day   RAMA,CHRISTINA  [facility] Pager [phone]. If unable to reach me by pager, please call my cell phone at [phone].  *Please refer to [email or web address], password [email or web address] to get updated schedule on who will round on this patient, as hospitalists switch teams weekly. If 7PM-7AM, please contact night-coverage at [email or web address], password [email or web address] for any overnight needs.  09/03/2015, 7:35 AM

## 2015-09-03 NOTE — Progress Notes (Signed)
OT Cancellation Note  Patient Details Name: Kylie Calhoun MRN: [id number] DOB: [birth year]   Cancelled Treatment:    Reason Eval/Treat Not Completed:  (Pt with breakfast. Will try to come back later.)  Benito Mccreedy OTR/L [id number] 09/03/2015, 10:56 AM

## 2015-09-03 NOTE — Plan of Care (Signed)
Problem: Safety: Goal: Ability to remain free from injury will improve Outcome: Progressing Instructed patient to stay in bed and to call for assistance using the call light or phone and use the white board for RN/NT numbers and that I would be placing the bed alarm on for the night, all personal belongings within reach and patient has her call light and phone within reach, will continue to monitor.

## 2015-09-04 DIAGNOSIS — G459 Transient cerebral ischemic attack, unspecified: Secondary | ICD-10-CM

## 2015-09-04 LAB — HEMOGLOBIN A1C
Hgb A1c MFr Bld: 6.2 % — ABNORMAL HIGH (ref 4.8–5.6)
Mean Plasma Glucose: 131 mg/dL

## 2015-09-04 MED ORDER — GABAPENTIN 100 MG PO CAPS: 200.0000 mg | ORAL_CAPSULE | Freq: Three times a day (TID) | ORAL | Status: DC

## 2015-09-04 MED ORDER — CLOPIDOGREL BISULFATE 75 MG PO TABS: 75.0000 mg | ORAL_TABLET | Freq: Every day | ORAL | Status: DC

## 2015-09-04 NOTE — Progress Notes (Signed)
Reviewed discharge instructions with patient and she stated her understanding.  Discharged home with friend via wheelchair and home walker.  Kylie Calhoun

## 2015-09-04 NOTE — Progress Notes (Signed)
Occupational Therapy Treatment Patient Details Name: Kylie Calhoun MRN: [id number] DOB: 1951-01-05 Today's Date: 09/04/2015    History of present illness Kylie Calhoun is a 65 y.o. female with medical history significant for left thalamic lacunar infarct likely from the proximal atherosclerotic changes of the left posterior cerebral artery,requiring hospitalization from 12/5 through 03/21/2015, discharge on Plavix and aspirin, now not on Plavix since March 2017,HLD, prior history of bladder cancer, osteopenia, presenting with 2 week history of intermittent right arm weakness, with some numbness worse around 9 pm last night, for which she presented to the emergency department for further evaluation   OT comments  Focus of session on educating pt on theraputty exercises and fine motor coordination activities. Pt demonstrated understanding of all.   Follow Up Recommendations  Outpatient OT;Supervision - Intermittent    Equipment Recommendations  None recommended by OT    Recommendations for Other Services      Precautions / Restrictions Precautions Precautions: Fall Restrictions Weight Bearing Restrictions: No       Mobility Bed Mobility Overal bed mobility: Independent                Transfers Overall transfer level: Modified independent Equipment used: None             General transfer comment: gait is tentative    Balance Overall balance assessment: Needs assistance Sitting-balance support: No upper extremity supported Sitting balance-Leahy Scale: Normal     Standing balance support: No upper extremity supported Standing balance-Leahy Scale: Fair                     ADL                                         General ADL Comments: Pt donned sandals and toileted at a modified independent level.      Vision                     Perception     Praxis      Cognition   Behavior During Therapy: WFL for tasks  assessed/performed Overall Cognitive Status: Within Functional Limits for tasks assessed                       Extremity/Trunk Assessment               Exercises Other Exercises Other Exercises: fine motor activities Other Exercises: level 2 theraputty   Shoulder Instructions       General Comments      Pertinent Vitals/ Pain       Pain Assessment: No/denies pain  Home Living                                          Prior Functioning/Environment              Frequency Min 2X/week     Progress Toward Goals  OT Goals(current goals can now be found in the care plan section)  Progress towards OT goals: Goals met/education completed, patient discharged from OT  Acute Rehab OT Goals Patient Stated Goal: To go home, continue to work on getting stronger. Time For Goal Achievement: 09/10/15 Potential to Achieve Goals: Good  Plan Discharge plan remains appropriate  Co-evaluation                 End of Session     Activity Tolerance Patient tolerated treatment well   Patient Left in chair;with call bell/phone within reach   Nurse Communication          Time: [phone] OT Time Calculation (min): 34 min  Charges: OT General Charges $OT Visit: 1 Procedure OT Treatments $Neuromuscular Re-education: 23-37 mins  Malka So 09/04/2015, 9:08 AM  [phone]

## 2015-09-04 NOTE — Progress Notes (Signed)
Updated report received via Costella Hatcher RN, reviewed VS, new orders and today's events, assumed care of patient.

## 2015-09-04 NOTE — Progress Notes (Signed)
Physical Therapy Treatment Patient Details Name: Kylie Calhoun MRN: [id number] DOB: 11-Mar-1951 Today's Date: 09/04/2015    History of Present Illness Kylie Calhoun is a 65 y.o. female with medical history significant for left thalamic lacunar infarct likely from the proximal atherosclerotic changes of the left posterior cerebral artery,requiring hospitalization from 12/5 through 03/21/2015, discharge on Plavix and aspirin, now not on Plavix since March 2017,HLD, prior history of bladder cancer, osteopenia, presenting with 2 week history of intermittent right arm weakness, with some numbness worse around 9 pm last night, for which she presented to the emergency department for further evaluation    PT Comments    Patient with some progress in mobility this session. Ambulated without device, performed stair negotiation, and discussed mobility expectations for discharge. Will continue to see and progress as tolerated.  Follow Up Recommendations  Outpatient PT     Equipment Recommendations  Rolling walker with 5" wheels    Recommendations for Other Services OT consult     Precautions / Restrictions Precautions Precautions: Fall Restrictions Weight Bearing Restrictions: No    Mobility  Bed Mobility Overal bed mobility: Independent                Transfers Overall transfer level: Modified independent                  Ambulation/Gait Ambulation/Gait assistance: Supervision Ambulation Distance (Feet): 180 Feet Assistive device: None Gait Pattern/deviations: Step-through pattern;Decreased stride length;Drifts right/left;Wide base of support Gait velocity: decreased   General Gait Details: patient with very rigid gait, ambulated in hall today without assistive device, cued for increased cadence to improve stability. Patient with some increased lateral drift and 2 noted LOB but able to self correct. Educated on safety with mobility   Stairs Stairs: Yes Stairs  assistance: Supervision Stair Management: One rail Right;Alternating pattern;Forwards Number of Stairs: 12 General stair comments: slightly impuslive on steps, no physical assist. Cues for safety  Wheelchair Mobility    Modified Rankin (Stroke Patients Only) Modified Rankin (Stroke Patients Only) Pre-Morbid Rankin Score: No significant disability Modified Rankin: Moderate disability     Balance Overall balance assessment: Needs assistance Sitting-balance support: No upper extremity supported Sitting balance-Leahy Scale: Normal     Standing balance support: No upper extremity supported Standing balance-Leahy Scale: Fair                      Cognition Arousal/Alertness: Awake/alert Behavior During Therapy: Impulsive Overall Cognitive Status: Within Functional Limits for tasks assessed                      Exercises      General Comments        Pertinent Vitals/Pain Pain Assessment: No/denies pain    Home Living                      Prior Function            PT Goals (current goals can now be found in the care plan section) Acute Rehab PT Goals Patient Stated Goal: To go home, continue to work on getting stronger. PT Goal Formulation: With patient Time For Goal Achievement: 09/17/15 Potential to Achieve Goals: Good Progress towards PT goals: Progressing toward goals    Frequency  Min 4X/week    PT Plan Current plan remains appropriate    Co-evaluation             End of Session Equipment Utilized During  Treatment: Gait belt Activity Tolerance: Patient tolerated treatment well Patient left: in bed;with call bell/phone within reach     Time: 0806-0824 PT Time Calculation (min) (ACUTE ONLY): 18 min  Charges:  $Gait Training: 8-22 mins                    G CodesDuncan Dull 09/26/15, 8:29 AM Kylie Calhoun, PT DPT  [phone]

## 2015-09-04 NOTE — Care Management Note (Signed)
Case Management Note  Patient Details  Name: Denija Vantassel MRN: [id number] Date of Birth: December 30, 1950  Subjective/Objective:  Pt admitted for acute stroke. Pt is from home and has been in [place] for about 4 months. Pt will need outpatient PT Services. Pt is familiar with [facility] [place] to return for services. CM did place an outpatient referral via Epic. Office to call pt with an appointment time.                Action/Plan: Pt in need of RW- CM did offer choice for DME and pt chose [facility]. CM did make referral for RW and [facility] will bring to room before d/c. No further needs from CM at this time.   Expected Discharge Date:                  Expected Discharge Plan:  Home/Self Care  In-House Referral:  NA  Discharge planning Services  CM Consult  Post Acute Care Choice:  Durable Medical Equipment Choice offered to:  Patient  DME Arranged:  [facility] rolling DME Agency:  [facility] Arranged:  NA (Outpatient PT Services set up. Pt declined OT services. ) [place] Agency:  NA  Status of Service:  Completed, signed off  Medicare Important Message Given:    Date Medicare IM Given:    Medicare IM give by:    Date Additional Medicare IM Given:    Additional Medicare Important Message give by:     If discussed at [place] of Stay Meetings, dates discussed:    Additional Comments:  Bethena Roys, RN 09/04/2015, 10:15 AM

## 2015-09-04 NOTE — Discharge Summary (Signed)
Physician Discharge Summary  Kylie Calhoun [id number] DOB: 06/24/1950 DOA: 09/02/2015  PCP: Marton Redwood, MD  Admit date: 09/02/2015 Discharge date: 09/04/2015   Recommendations for Outpatient Follow-Up:   1. The patient has F/U scheduled with Dr. Jannifer Franklin of neurology 09/18/15.   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    Discharge Condition: Improved.  Diet recommendation: Low sodium, heart healthy.    History of Present Illness:   Kylie Calhoun is an 65 y.o. female with a PMH of hyperlipidemia, prior bladder cancer, recent hospitalization 03/19/15-03/21/15 for treatment of left thalamus lacunar infarct in the setting of cerebrovascular disease, discharged on aspirin and Plavix but ultimately stopped Plavix March 2017 who was admitted 09/02/15 with a chief complaint of right arm weakness. MRI of the brain showed acute/subacute punctate infarct involving the splenium of the corpus callosum on the right with stable atrophy and white matter disease, moderately advanced for age.  Hospital Course by Problem:   Principal Problem:  Stroke [facility]) In the setting of moderately advanced cerebrovascular disease/altered sensation as late effect of stroke/Possible normal pressure hydrocephalus Seen by neurology with recommendations to treat with aspirin and Plavix together for at least 3 months. 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 She has been started on gabapentin to address this per neurology recommendations to address this. No need to repeat 2-D echo or carotid Dopplers which were done back in December. CT angiogram of the head and neck showed no extracranial flow  reducing stenoses or intracranial large vessel stenosis/occlusion, stable mild irregularity of the left PCA at the P1/P2 junction. Continue aggressive risk factor modification. On a statin to address hyperlipidemia. PT/OT evaluations performed with outpatient PT/OT recommended. To address the possible normal pressure hydrocephalus, Dr. Silverio Decamp recommended LP to see if this improves her ataxic gait, however consulting neurologist recommends that this be done as an outpatient.  Active Problems:  Hx of osteopenia   Malignant neoplasm of urinary bladder (HCC)   HLD (hyperlipidemia) Continue Lipitor. LDL controlled at 68.    Medical Consultants:    Neurology   Discharge Exam:   Filed Vitals:   09/04/15 0429 09/04/15 0826  BP: 129/73 102/63  Pulse: 76 82  Temp: 97.9 F (36.6 C) 97.5 F (36.4 C)  Resp: 16 18   Filed Vitals:   09/03/15 2000 09/04/15 0006 09/04/15 0429 09/04/15 0826  BP: 128/68 121/71 129/73 102/63  Pulse: 72 83 76 82  Temp: 97.8 F (36.6 C) 98 F (36.7 C) 97.9 F (36.6 C) 97.5 F (36.4 C)  TempSrc:    Oral  Resp: 17 18 16 18   Height:      Weight:   57.017 kg (125 lb 11.2 oz)   SpO2: 97% 98% 99% 99%   General exam: Appears calm and comfortable.  Respiratory system: Clear to auscultation. Respiratory effort normal. Cardiovascular system: S1 & S2 heard, RRR. No JVD, rubs, gallops or clicks. No murmurs. Gastrointestinal system: Abdomen is nondistended, soft and nontender. No organomegaly or masses felt. Normal bowel sounds heard. Central nervous system: Alert and oriented. No focal neurological deficits.Wide-based gait. Extremities: No clubbing, edema, or cyanosis. Skin: No rashes, lesions or  ulcers Psychiatry: Judgement and insight appear normal. Mood & affect appropriate.   The results of significant diagnostics from this hospitalization (including imaging, microbiology, ancillary and laboratory) are listed below for reference.     Procedures and  Diagnostic Studies:   Ct Angio Head W/cm &/or Wo Cm  09/03/2015  CLINICAL DATA:  RIGHT-sided weakness.  History of CVA. EXAM: CT ANGIOGRAPHY HEAD AND NECK TECHNIQUE: Multidetector CT imaging of the head and neck was performed using the standard protocol during bolus administration of intravenous contrast. Multiplanar CT image reconstructions and MIPs were obtained to evaluate the vascular anatomy. Carotid stenosis measurements (when applicable) are obtained utilizing NASCET criteria, using the distal internal carotid diameter as the denominator. CONTRAST:  Isovue 370, 50 mL. COMPARISON:  MR brain 09/02/2015. CT head 09/02/2015. MRI and MRA 03/19/2015. FINDINGS: CT HEAD Calvarium and skull base: No fracture or destructive lesion. Mastoids and middle ears are grossly clear. Paranasal sinuses: Imaged portions are clear. Orbits: Negative. Brain: No evidence of acute abnormality, including acute infarct, hemorrhage, or mass lesion. The small area of acute infarction identified on MR is not visible. Global atrophy with hydrocephalus ex vacuo. CTA NECK Aortic arch: Standard branching. Imaged portion shows no evidence of aneurysm or dissection. No significant stenosis of the major arch vessel origins. Right carotid system: Minor atheromatous change at the bifurcation. No evidence of dissection, stenosis (50% or greater) or occlusion. Left carotid system: Minor atheromatous change the bifurcation. No evidence of dissection, stenosis (50% or greater) or occlusion. Vertebral arteries: LEFT vertebral dominant. No evidence of dissection, stenosis (50% or greater) or occlusion. Nonvascular soft tissues: Lung apices clear. No neck masses. Minor spondylosis. CTA HEAD Anterior circulation: No significant stenosis, proximal occlusion, aneurysm, or vascular malformation. Posterior circulation: RIGHT vertebral hypoplastic distal to the PICA origin. Mild irregularity of the ambient segment P1/P2 junction on the LEFT, not clearly flow  reducing. No significant stenosis, proximal occlusion, aneurysm, or vascular malformation. Venous sinuses: As permitted by contrast timing, patent. Anatomic variants: None of significance. Delayed phase:   No abnormal intracranial enhancement. IMPRESSION: No extracranial flow reducing stenosis. No intracranial large vessel stenosis or occlusion. Stable mild irregularity of the LEFT PCA at the P1/P2 junction. Chronic changes as described similar to prior MR. The acute subcentimeter infarction of the splenium identified on yesterday's study is not visible on CT. Electronically Signed   By: Staci Righter M.D.   On: 09/03/2015 10:36   Dg Chest 2 View  09/02/2015  CLINICAL DATA:  Bilateral arm weakness and numbness EXAM: CHEST  2 VIEW COMPARISON:  03/19/2015 FINDINGS: Lungs are clear.  No pleural effusion or pneumothorax. The heart is normal in size. S-shaped thoracolumbar scoliosis. IMPRESSION: No evidence of acute cardiopulmonary disease. Electronically Signed   By: Julian Hy M.D.   On: 09/02/2015 15:28   Ct Head Wo Contrast  09/02/2015  CLINICAL DATA:  Right-sided numbness. EXAM: CT HEAD WITHOUT CONTRAST TECHNIQUE: Contiguous axial images were obtained from the base of the skull through the vertex without intravenous contrast. COMPARISON:  March 15, 2015 FINDINGS: Paranasal sinuses, mastoid air cells, and bones are within normal limits. The extracranial soft tissues are also normal. No subdural, epidural, or subarachnoid hemorrhage is identified. No mass, mass effect, or midline shift. The ventricles and sulci remain prominent but stable. The cerebellum and brainstem are normal. The basal cisterns are patent as well. White matter changes are stable. Low attenuation remains in the left thalamus, stable as well. No acute cortical ischemia or infarct identified. No  other acute abnormalities are seen 1 today use study. IMPRESSION: Chronic white matter changes. No acute ischemia, infarct, or bleed  identified. Findings called to Dr. Silverio Decamp at 11:20 a.m. Electronically Signed   By: Dorise Bullion III M.D   On: 09/02/2015 11:25   Ct Angio Neck W/cm &/or Wo/cm  09/03/2015  CLINICAL DATA:  RIGHT-sided weakness.  History of CVA. EXAM: CT ANGIOGRAPHY HEAD AND NECK TECHNIQUE: Multidetector CT imaging of the head and neck was performed using the standard protocol during bolus administration of intravenous contrast. Multiplanar CT image reconstructions and MIPs were obtained to evaluate the vascular anatomy. Carotid stenosis measurements (when applicable) are obtained utilizing NASCET criteria, using the distal internal carotid diameter as the denominator. CONTRAST:  Isovue 370, 50 mL. COMPARISON:  MR brain 09/02/2015. CT head 09/02/2015. MRI and MRA 03/19/2015. FINDINGS: CT HEAD Calvarium and skull base: No fracture or destructive lesion. Mastoids and middle ears are grossly clear. Paranasal sinuses: Imaged portions are clear. Orbits: Negative. Brain: No evidence of acute abnormality, including acute infarct, hemorrhage, or mass lesion. The small area of acute infarction identified on MR is not visible. Global atrophy with hydrocephalus ex vacuo. CTA NECK Aortic arch: Standard branching. Imaged portion shows no evidence of aneurysm or dissection. No significant stenosis of the major arch vessel origins. Right carotid system: Minor atheromatous change at the bifurcation. No evidence of dissection, stenosis (50% or greater) or occlusion. Left carotid system: Minor atheromatous change the bifurcation. No evidence of dissection, stenosis (50% or greater) or occlusion. Vertebral arteries: LEFT vertebral dominant. No evidence of dissection, stenosis (50% or greater) or occlusion. Nonvascular soft tissues: Lung apices clear. No neck masses. Minor spondylosis. CTA HEAD Anterior circulation: No significant stenosis, proximal occlusion, aneurysm, or vascular malformation. Posterior circulation: RIGHT vertebral hypoplastic  distal to the PICA origin. Mild irregularity of the ambient segment P1/P2 junction on the LEFT, not clearly flow reducing. No significant stenosis, proximal occlusion, aneurysm, or vascular malformation. Venous sinuses: As permitted by contrast timing, patent. Anatomic variants: None of significance. Delayed phase:   No abnormal intracranial enhancement. IMPRESSION: No extracranial flow reducing stenosis. No intracranial large vessel stenosis or occlusion. Stable mild irregularity of the LEFT PCA at the P1/P2 junction. Chronic changes as described similar to prior MR. The acute subcentimeter infarction of the splenium identified on yesterday's study is not visible on CT. Electronically Signed   By: Staci Righter M.D.   On: 09/03/2015 10:36   Mr Brain Wo Contrast  09/02/2015  CLINICAL DATA:  One-week history bilateral upper extremity numbness and right lower extremity numbness. Left-sided symptoms were worse today. Personal history of left thalamic infarct 03/19/2015. EXAM: MRI HEAD WITHOUT CONTRAST TECHNIQUE: Multiplanar, multiecho pulse sequences of the brain and surrounding structures were obtained without intravenous contrast. COMPARISON:  CT head without contrast 09/02/2015. MRI brain 03/24/2015. FINDINGS: Focal diffusion signal abnormality is present along the right side of the splenium of the corpus callosum. The ADC map is isointense at this point. Moderate generalized atrophy and diffuse white matter disease is present bilaterally. The ventricles are proportionate to the degree of atrophy. No significant extra-axial fluid collection is present. Focal T2 signal changes are associated with the lesion in the splenium of the corpus callosum. These T2 changes were not present on the December study. The internal auditory canals are normal bilaterally. Mild white matter changes in the brainstem are stable. Remote lacunar infarcts of the cerebellum are stable. The left thalamic infarct has evolved. Flow is  present in the major  intracranial arteries. The globes and orbits are intact. The skullbase is within normal limits. Midline sagittal images are unremarkable. Diffuse confluent periventricular white matter disease is otherwise stable. IMPRESSION: 1. Acute/subacute punctate infarct involving the splenium of the corpus callosum on the right corresponds with symptoms beginning several days ago. 2. No other acute intracranial abnormality. 3. Stable atrophy and white matter disease reflects the sequela of chronic microvascular ischemia, moderately advanced for age. 4. Expected evolution of nonhemorrhagic infarct involving the left thalamus. Electronically Signed   By: San Morelle M.D.   On: 09/02/2015 13:01     Labs:   Basic Metabolic Panel:  Recent Labs Lab 09/02/15 1050 09/02/15 1120  NA 139 144  K 3.5 3.5  CL 107 104  CO2 25  --   GLUCOSE 110* 102*  BUN 13 15  CREATININE 0.89 0.80  CALCIUM 9.2  --    GFR Estimated Creatinine Clearance: 57.2 mL/min (by C-G formula based on Cr of 0.8). Liver Function Tests:  Recent Labs Lab 09/02/15 1050  AST 25  ALT 21  ALKPHOS 65  BILITOT 0.9  PROT 6.3*  ALBUMIN 3.6   Coagulation profile  Recent Labs Lab 09/02/15 1050 09/02/15 1432  INR 1.07 0.99    CBC:  Recent Labs Lab 09/02/15 1050 09/02/15 1120  WBC 6.6  --   NEUTROABS 3.8  --   HGB 14.0 15.3*  HCT 42.9 45.0  MCV 87.6  --   PLT 271  --    Cardiac Enzymes:  Recent Labs Lab 09/02/15 1432  TROPONINI <0.03   Hgb A1c  Recent Labs  09/03/15 0452  HGBA1C 6.2*   Lipid Profile  Recent Labs  09/03/15 0452  CHOL 141  HDL 44  LDLCALC 68  TRIG 146  CHOLHDL 3.2     Discharge Instructions:   Discharge Instructions    Ambulatory referral to Physical Therapy    Complete by:  As directed   Physical Therapy for evaluation and treatment.     Call MD for:  difficulty breathing, headache or visual disturbances    Complete by:  As directed      Call MD  for:  persistant dizziness or light-headedness    Complete by:  As directed      Diet - low sodium heart healthy    Complete by:  As directed      Driving Restrictions    Complete by:  As directed   No driving until you see your neurologist in follow up.     Increase activity slowly    Complete by:  As directed             Medication List    TAKE these medications        aspirin EC 81 MG tablet  Take 81 mg by mouth daily.     atorvastatin 20 MG tablet  Commonly known as:  LIPITOR  Take 1 tablet by mouth daily at 6 PM.     clopidogrel 75 MG tablet  Commonly known as:  PLAVIX  Take 1 tablet (75 mg total) by mouth daily.     gabapentin 100 MG capsule  Commonly known as:  NEURONTIN  Take 2 capsules (200 mg total) by mouth 3 (three) times daily.           Follow-up Information    Follow up with Lenor Coffin, MD. Go on 09/18/2015.   Specialty:  Neurology   Contact information:   [address] [place]  [place] [address] [phone]       Schedule an appointment as soon as possible for a visit with Marton Redwood, MD.   Specialty:  Internal Medicine   Why:  If symptoms worsen   Contact information:   [address] [place] [address] [phone]       Follow up with [facility].   Specialty:  Rehabilitation   Why:  Office to call the patient with appointment times. If office has not called within 2-3 business days please call office.    Contact information:   [address] [place] [phone] [place]       Time coordinating discharge: 35 minutes.  Signed:  Keishawna Carranza  Pager [phone] [facility] 09/04/2015, 1:19 PM

## 2015-09-04 NOTE — Discharge Instructions (Signed)
Stroke Prevention Some medical conditions and behaviors are associated with an increased chance of having a stroke. You may prevent a stroke by making healthy choices and managing medical conditions. HOW CAN I REDUCE MY RISK OF HAVING A STROKE?   Stay physically active. Get at least 30 minutes of activity on most or all days.  Do not smoke. It may also be helpful to avoid exposure to secondhand smoke.  Limit alcohol use. Moderate alcohol use is considered to be:  No more than 2 drinks per day for men.  No more than 1 drink per day for nonpregnant women.  Eat healthy foods. This involves:  Eating 5 or more servings of fruits and vegetables a day.  Making dietary changes that address high blood pressure (hypertension), high cholesterol, diabetes, or obesity.  Manage your cholesterol levels.  Making food choices that are high in fiber and low in saturated fat, trans fat, and cholesterol may control cholesterol levels.  Take any prescribed medicines to control cholesterol as directed by your health care provider.  Manage your diabetes.  Controlling your carbohydrate and sugar intake is recommended to manage diabetes.  Take any prescribed medicines to control diabetes as directed by your health care provider.  Control your hypertension.  Making food choices that are low in salt (sodium), saturated fat, trans fat, and cholesterol is recommended to manage hypertension.  Ask your health care provider if you need treatment to lower your blood pressure. Take any prescribed medicines to control hypertension as directed by your health care provider.  If you are 75-81 years of age, have your blood pressure checked every 3-5 years. If you are 35 years of age or older, have your blood pressure checked every year.  Maintain a healthy weight.  Reducing calorie intake and making food choices that are low in sodium, saturated fat, trans fat, and cholesterol are recommended to manage  weight.  Stop drug abuse.  Avoid taking birth control pills.  Talk to your health care provider about the risks of taking birth control pills if you are over 67 years old, smoke, get migraines, or have ever had a blood clot.  Get evaluated for sleep disorders (sleep apnea).  Talk to your health care provider about getting a sleep evaluation if you snore a lot or have excessive sleepiness.  Take medicines only as directed by your health care provider.  For some people, aspirin or blood thinners (anticoagulants) are helpful in reducing the risk of forming abnormal blood clots that can lead to stroke. If you have the irregular heart rhythm of atrial fibrillation, you should be on a blood thinner unless there is a good reason you cannot take them.  Understand all your medicine instructions.  Make sure that other conditions (such as anemia or atherosclerosis) are addressed. SEEK IMMEDIATE MEDICAL CARE IF:   You have sudden weakness or numbness of the face, arm, or leg, especially on one side of the body.  Your face or eyelid droops to one side.  You have sudden confusion.  You have trouble speaking (aphasia) or understanding.  You have sudden trouble seeing in one or both eyes.  You have sudden trouble walking.  You have dizziness.  You have a loss of balance or coordination.  You have a sudden, severe headache with no known cause.  You have new chest pain or an irregular heartbeat. Any of these symptoms may represent a serious problem that is an emergency. Do not wait to see if the symptoms will  go away. Get medical help at once. Call your local emergency services (911 in [place] Do not drive yourself to the hospital.   This information is not intended to replace advice given to you by your health care provider. Make sure you discuss any questions you have with your health care provider.   Document Released: 05/08/2004 Document Revised: 04/21/2014 Document Reviewed:  10/01/2012 Elsevier Interactive Patient Education 2016 [employer].   Ischemic Stroke Treated Without Warfarin An ischemic stroke (cerebrovascular accident) is the sudden death of brain tissue. It is a medical emergency. An ischemic stroke can cause permanent loss of brain function. This can cause problems with different parts of your body. CAUSES An ischemic stroke is caused by a decrease of oxygen supply to an area of your brain. It is usually the result of a small blood clot (embolus) or collection of cholesterol or fat (plaque) that blocks blood flow in the brain. An ischemic stroke can also be caused by blocked or damaged carotid arteries. RISK FACTORS  High blood pressure (hypertension).  High cholesterol.  Diabetes mellitus.  Heart disease.  The buildup of plaque in the blood vessels (peripheral artery disease or atherosclerosis).  The buildup of plaque in the blood vessels that provide blood and oxygen to the brain (carotid artery stenosis).  An abnormal heart rhythm (atrial fibrillation).  Obesity.  Smoking cigarettes.  Taking oral contraceptives, especially in combination with using tobacco.  Physical inactivity.  A diet that is high in fats, salt (sodium), and calories.  Excessive alcohol use.  Use of illegal drugs, especially cocaine and methamphetamine.  Being African American.  Being over the age of 44 years.  Family history of stroke.  Previous history of blood clots, stroke, TIA (transient ischemic attack), or heart attack.  Sickle cell disease. SIGNS AND SYMPTOMS These symptoms usually develop suddenly, or you may notice them after waking up from sleep. Symptoms may include sudden:  Weakness or numbness in your face, arm, or leg, especially on one side of your body.  Confusion.  Trouble speaking (aphasia) or understanding speech.  Trouble seeing with one or both eyes.  Trouble walking or difficulty moving your arms or  legs.  Dizziness.  Loss of balance or coordination.  Severe headache with no known cause. The headache is often described as the worst headache ever experienced. DIAGNOSIS Your health care provider can often determine the presence or absence of an ischemic stroke based on your symptoms, history, and physical exam. CT (computed tomography) of the brain is usually performed to confirm the stroke, determine causes, and determine stroke severity. Other tests may be done to find the cause of the stroke. These tests may include:  ECG (electrocardiogram).  Continuous heart monitoring.  Echocardiogram.  Carotid ultrasound.  MRI.  A scan of the brain circulation.  Blood tests. TREATMENT It is very important to seek treatment at the first sign of stroke symptoms. Your health care provider may perform the following treatments within 6 hours of the onset of stroke symptoms:  Medicine to dissolve the blood clot (thrombolytic).  Inserting a device into the affected artery to remove the blood clot. These treatments may not be effective if too much time has passed since your stroke symptoms began. Even if you do not know when your symptoms began, get treatment as soon as possible. There are other treatment options that may be given, such as:  Oxygen.  IV fluids.  Medicines to thin the blood (anticoagulants).  A procedure to widen blocked arteries.  Your treatment will depend on how long you have had your symptoms, the severity of your symptoms, and the cause of your symptoms. Your health care provider will take measures to prevent short-term and long-term complications of stroke, such as:  Breathing foreign material into the lungs (aspiration pneumonia).  Blood clots in the legs.  Bedsores.  Falls. Medicines and dietary changes may be used to help treat and manage risk factors for stroke, such as diabetes and high blood pressure. If any of your body's functions were impaired by  stroke, you may work with physical, speech, or occupational therapists to help you recover. HOME CARE INSTRUCTIONS  Take medicines only as directed by your health care provider. Follow the directions carefully. Medicines may be used to control risk factors for a stroke. Be sure that you understand all your medicine instructions.  If swallow studies have determined that your swallowing reflex is present, you should eat healthy foods. Foods may need to be a soft or pureed consistency, or you may need to take small bites in order to avoid aspirating or choking.  Follow physical activity guidelines as directed by your health care team.  Do not use any tobacco products, including cigarettes, chewing tobacco, or electronic cigarettes. If you smoke, quit. If you need help quitting, ask your health care provider.  Limit or stop alcohol use.  A safe home environment is important to reduce the risk of falls. Your health care provider may arrange for specialists to evaluate your home. Having grab bars in the bedroom and bathroom is often important. Your health care provider may arrange for equipment to be used at home, such as raised toilets and a seat for the shower.  Ongoing physical, occupational, and speech therapy may be needed to maximize your recovery after a stroke. If you have been advised to use a walker or a cane, use it at all times. Be sure to keep your therapy appointments.  Keep all follow-up visits with your health care provider. This is very important. This includes any referrals, therapy, rehabilitation, and lab tests. Proper follow-up can prevent another stroke from occurring. PREVENTION The risk of a stroke can be decreased by appropriately treating high blood pressure, high cholesterol, diabetes, heart disease, and obesity. It can also be decreased by quitting smoking, limiting alcohol, and staying physically active. SEEK IMMEDIATE MEDICAL CARE IF:  You have sudden weakness or  numbness in your face, arm, or leg, especially on one side of your body.  You have sudden confusion.  You have sudden trouble speaking (aphasia) or understanding.  You have sudden trouble seeing with one or both eyes.  You have sudden trouble walking or difficulty moving your arms or legs.  You have sudden dizziness.  You have a sudden loss of balance or coordination.  You have a sudden, severe headache with no known cause.  You have a partial or total loss of consciousness. Any of these symptoms may represent a serious problem that is an emergency. Do not wait to see if the symptoms will go away. Get medical help right away. Call your local emergency services (911 in [place] Do not drive yourself to the hospital.   This information is not intended to replace advice given to you by your health care provider. Make sure you discuss any questions you have with your health care provider.   Document Released: 01/13/2014 Document Reviewed: 01/13/2014 Elsevier Interactive Patient Education [employer].

## 2015-09-04 NOTE — Plan of Care (Signed)
Problem: Health Behavior/Discharge Planning: Goal: Ability to manage health-related needs will improve Outcome: Completed/Met Date Met:  09/04/15 Outpatient therapy arranged by care manager.  Problem: Nutrition: Goal: Dietary intake will improve Outcome: Completed/Met Date Met:  09/04/15 Tolerating solid diet.

## 2015-09-11 ENCOUNTER — Telehealth: Payer: Self-pay | Admitting: Neurology

## 2015-09-11 NOTE — Telephone Encounter (Signed)
Letter completed and awaiting signature.

## 2015-09-11 NOTE — Telephone Encounter (Signed)
Returned pt Kylie Calhoun. She was at [facility] from 09/02/15-09/04/15 d/t stroke symptoms. Pt was instructed not to drive x 2 weeks. However, she has jury duty summons to begin on 09/18/15 and would like a letter excusing her from court. This should be mailed to: [employer], [address] [place], [place] [address].

## 2015-09-11 NOTE — Telephone Encounter (Signed)
Message For: OFFICE               Taken 30-MAY-17 at  1:08PM by [facility] ------------------------------------------------------------  Kylie Calhoun              CID  [id number]   Patient  SAME                  Pt's Dr  Jannifer Franklin        Area Code  [id number]  Phone#  [phone] *  DOB  05-Sep-2050      RE  GOT CALLED FOR JURY DUTY,HAD A MINI STROKE LAST   WEEK, NEEDS HELP GETTING OUT OF TO MAKE APPT 6/6      Disp:Y/N     If Y = C/B If No Response In 49minutes  ============================================================

## 2015-09-12 NOTE — Telephone Encounter (Signed)
Signed and placed in mail.  

## 2015-09-18 ENCOUNTER — Encounter: Payer: Self-pay | Admitting: Neurology

## 2015-09-18 ENCOUNTER — Ambulatory Visit (INDEPENDENT_AMBULATORY_CARE_PROVIDER_SITE_OTHER): Payer: Medicare Other | Admitting: Neurology

## 2015-09-18 VITALS — BP 112/70 | HR 60 | Ht 63.0 in | Wt 130.0 lb

## 2015-09-18 DIAGNOSIS — I69898 Other sequelae of other cerebrovascular disease: Secondary | ICD-10-CM | POA: Diagnosis not present

## 2015-09-18 DIAGNOSIS — I69398 Other sequelae of cerebral infarction: Secondary | ICD-10-CM

## 2015-09-18 DIAGNOSIS — I639 Cerebral infarction, unspecified: Secondary | ICD-10-CM | POA: Diagnosis not present

## 2015-09-18 DIAGNOSIS — R209 Unspecified disturbances of skin sensation: Secondary | ICD-10-CM

## 2015-09-18 DIAGNOSIS — R2681 Unsteadiness on feet: Secondary | ICD-10-CM

## 2015-09-18 NOTE — Progress Notes (Signed)
Reason for visit: Stroke  Kylie Calhoun is an 65 y.o. female  History of present illness:  Kylie Calhoun is a 65 year old right-handed white female with a history of cerebrovascular disease. 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 She went to the hospital was found to have a corpus callosum small vessel infarct. The patient was seen by Dr. Erlinda Hong, and recommendations again were made to keep the patient on aspirin and Plavix combination for 3 months, then switch to Plavix alone. The patient has done well coming out of the hospital. 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 The patient does report some issues with balance for a number of years, she also has some bladder control issues since she had bladder cancer many years ago. The patient still has some residual numbness of the arms, but otherwise she is doing well with the use of the arms. The patient is trying to walk on a regular basis. She had CT angiogram of the head as well showing some intracranial atherosclerosis, particularly affecting the left posterior cerebral artery. The patient reports no falls at home, she does not use a cane or a walker to get around.  Past Medical History  Diagnosis Date  . Bladder cancer ([facility]) 2011    3 times  . Hx of osteopenia   . Diverticulosis     mild per colonoscopy  . Hemorrhoid     internal per colonoscopy 2016  . Stroke ([facility])   . Dyslipidemia   . Ankle fracture, left   . Alteration of sensation as late effect of stroke 05/11/2015    Past Surgical History  Procedure Laterality Date  . 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 Tonsillectomy    . Abdominal hysterectomy    . Appendectomy    . Bladder surgery      scope and removal of  tumors  . Refractive surgery      Family History  Problem Relation Age of Onset  . Heart disease Father   . Stroke Sister   . Hypertension Sister   . Stroke Brother   . Hypertension Brother     Social history:  reports that she has never smoked. She has never used smokeless tobacco. She reports that she drinks alcohol. She reports that she does not use illicit drugs.   No Known Allergies  Medications:  Prior to Admission medications   Medication Sig Start Date End Date Taking? Authorizing Provider  aspirin EC 81 MG tablet Take 81 mg by mouth daily.   Yes Historical Provider, MD  atorvastatin (LIPITOR) 20 MG tablet Take 1 tablet by mouth daily at 6 PM.  03/21/15  Yes Historical Provider, MD  clopidogrel (PLAVIX) 75 MG tablet Take 1 tablet (75 mg total) by mouth daily. 09/04/15  Yes Christina P Rama, MD  gabapentin (NEURONTIN) 100 MG capsule Take 2 capsules (200 mg total) by mouth 3 (three) times daily. 09/04/15  Yes Venetia Maxon Rama, MD    ROS:  Out of a complete 14 system review of symptoms, the patient complains only of the following symptoms, and all other reviewed systems are negative.  Decreased appetite, fatigue Daytime sleepiness Bruising easily Numbness  Blood pressure 112/70, pulse  60, height 5\' 3"  (1.6 m), weight 130 lb (58.968 kg).  Physical Exam  General: The patient is alert and cooperative at the time of the examination.  Skin: No significant peripheral edema is noted.   Neurologic Exam  Mental status: The patient is alert and oriented x 3 at the time of the examination. The patient has apparent normal recent and remote memory, with an apparently normal attention span and concentration ability.   Cranial nerves: Facial symmetry is present. Speech is normal, no aphasia or dysarthria is noted. Extraocular movements are full. Visual fields are full.  Motor: The patient has good strength in all 4 extremities.  Sensory examination: Soft touch sensation is  symmetric on the face, arms, and legs.  Coordination: The patient has good finger-nose-finger and heel-to-shin bilaterally.  Gait and station: The patient has a slightly wide-based, unsteady gait. Tandem gait is unsteady. Romberg is negative. No drift is seen. With walking, the patient has some flexion of the left arm with abduction of the arm slightly.  Reflexes: Deep tendon reflexes are symmetric.   MRI brain 09/02/15:  IMPRESSION: 1. Acute/subacute punctate infarct involving the splenium of the corpus callosum on the right corresponds with symptoms beginning several days ago. 2. No other acute intracranial abnormality. 3. Stable atrophy and white matter disease reflects the sequela of chronic microvascular ischemia, moderately advanced for age. 4. Expected evolution of nonhemorrhagic infarct involving the left Thalamus.  * MRI scan images were reviewed online. I agree with the written report.    Assessment/Plan:  1. Cerebrovascular disease, recent corpus callosum stroke  2. Gait disorder  The patient does have ventriculomegaly on MRI of the brain, and there is some question of normal pressure hydrocephalus. By history, the patient's ability to ambulate has been stable, we will check back in 6 months and reassess this. The patient does report some urinary incontinence at times since she has had bladder cancer. The patient will continue aspirin and Plavix for 3 months, stop aspirin in the third week of August. The patient is planning a trip to [place] in September.   Jill Alexanders MD 09/18/2015 7:28 PM  [facility] [address] [place], [place] [address]  Phone [phone] Fax [phone]

## 2015-09-18 NOTE — Patient Instructions (Signed)
Stroke Prevention Some medical conditions and behaviors are associated with an increased chance of having a stroke. You may prevent a stroke by making healthy choices and managing medical conditions. HOW CAN I REDUCE MY RISK OF HAVING A STROKE?   Stay physically active. Get at least 30 minutes of activity on most or all days.  Do not smoke. It may also be helpful to avoid exposure to secondhand smoke.  Limit alcohol use. Moderate alcohol use is considered to be:  No more than 2 drinks per day for men.  No more than 1 drink per day for nonpregnant women.  Eat healthy foods. This involves:  Eating 5 or more servings of fruits and vegetables a day.  Making dietary changes that address high blood pressure (hypertension), high cholesterol, diabetes, or obesity.  Manage your cholesterol levels.  Making food choices that are high in fiber and low in saturated fat, trans fat, and cholesterol may control cholesterol levels.  Take any prescribed medicines to control cholesterol as directed by your health care provider.  Manage your diabetes.  Controlling your carbohydrate and sugar intake is recommended to manage diabetes.  Take any prescribed medicines to control diabetes as directed by your health care provider.  Control your hypertension.  Making food choices that are low in salt (sodium), saturated fat, trans fat, and cholesterol is recommended to manage hypertension.  Ask your health care provider if you need treatment to lower your blood pressure. Take any prescribed medicines to control hypertension as directed by your health care provider.  If you are 18-39 years of age, have your blood pressure checked every 3-5 years. If you are 40 years of age or older, have your blood pressure checked every year.  Maintain a healthy weight.  Reducing calorie intake and making food choices that are low in sodium, saturated fat, trans fat, and cholesterol are recommended to manage  weight.  Stop drug abuse.  Avoid taking birth control pills.  Talk to your health care provider about the risks of taking birth control pills if you are over 35 years old, smoke, get migraines, or have ever had a blood clot.  Get evaluated for sleep disorders (sleep apnea).  Talk to your health care provider about getting a sleep evaluation if you snore a lot or have excessive sleepiness.  Take medicines only as directed by your health care provider.  For some people, aspirin or blood thinners (anticoagulants) are helpful in reducing the risk of forming abnormal blood clots that can lead to stroke. If you have the irregular heart rhythm of atrial fibrillation, you should be on a blood thinner unless there is a good reason you cannot take them.  Understand all your medicine instructions.  Make sure that other conditions (such as anemia or atherosclerosis) are addressed. SEEK IMMEDIATE MEDICAL CARE IF:   You have sudden weakness or numbness of the face, arm, or leg, especially on one side of the body.  Your face or eyelid droops to one side.  You have sudden confusion.  You have trouble speaking (aphasia) or understanding.  You have sudden trouble seeing in one or both eyes.  You have sudden trouble walking.  You have dizziness.  You have a loss of balance or coordination.  You have a sudden, severe headache with no known cause.  You have new chest pain or an irregular heartbeat. Any of these symptoms may represent a serious problem that is an emergency. Do not wait to see if the symptoms will   go away. Get medical help at once. Call your local emergency services (911 in [place] Do not drive yourself to the hospital.   This information is not intended to replace advice given to you by your health care provider. Make sure you discuss any questions you have with your health care provider.   Document Released: 05/08/2004 Document Revised: 04/21/2014 Document Reviewed:  10/01/2012 Elsevier Interactive Patient Education 2016 [employer]  

## 2016-03-20 ENCOUNTER — Ambulatory Visit: Payer: Medicare Other | Admitting: Neurology

## 2016-03-27 ENCOUNTER — Other Ambulatory Visit: Payer: Self-pay | Admitting: Neurology

## 2016-03-27 DIAGNOSIS — I639 Cerebral infarction, unspecified: Secondary | ICD-10-CM

## 2016-04-03 ENCOUNTER — Encounter: Payer: Self-pay | Admitting: Neurology

## 2016-04-03 ENCOUNTER — Ambulatory Visit (INDEPENDENT_AMBULATORY_CARE_PROVIDER_SITE_OTHER): Payer: Medicare Other | Admitting: Neurology

## 2016-04-03 VITALS — BP 130/82 | HR 73 | Resp 14 | Ht 63.0 in | Wt 129.0 lb

## 2016-04-03 DIAGNOSIS — R2681 Unsteadiness on feet: Secondary | ICD-10-CM

## 2016-04-03 NOTE — Patient Instructions (Addendum)
Fall Prevention in the Home Falls can cause injuries and can affect people from all age groups. There are many simple things that you can do to make your home safe and to help prevent falls. What can I do on the outside of my home?  Regularly repair the edges of walkways and driveways and fix any cracks.  Remove high doorway thresholds.  Trim any shrubbery on the main path into your home.  Use bright outdoor lighting.  Clear walkways of debris and clutter, including tools and rocks.  Regularly check that handrails are securely fastened and in good repair. Both sides of any steps should have handrails.  Install guardrails along the edges of any raised decks or porches.  Have leaves, snow, and ice cleared regularly.  Use sand or salt on walkways during winter months.  In the garage, clean up any spills right away, including grease or oil spills. What can I do in the bathroom?  Use night lights.  Install grab bars by the toilet and in the tub and shower. Do not use towel bars as grab bars.  Use non-skid mats or decals on the floor of the tub or shower.  If you need to sit down while you are in the shower, use a plastic, non-slip stool.  Keep the floor dry. Immediately clean up any water that spills on the floor.  Remove soap buildup in the tub or shower on a regular basis.  Attach bath mats securely with double-sided non-slip rug tape.  Remove throw rugs and other tripping hazards from the floor. What can I do in the bedroom?  Use night lights.  Make sure that a bedside light is easy to reach.  Do not use oversized bedding that drapes onto the floor.  Have a firm chair that has side arms to use for getting dressed.  Remove throw rugs and other tripping hazards from the floor. What can I do in the kitchen?  Clean up any spills right away.  Avoid walking on wet floors.  Place frequently used items in easy-to-reach places.  If you need to reach for something above  you, use a sturdy step stool that has a grab bar.  Keep electrical cables out of the way.  Do not use floor polish or wax that makes floors slippery. If you have to use wax, make sure that it is non-skid floor wax.  Remove throw rugs and other tripping hazards from the floor. What can I do in the stairways?  Do not leave any items on the stairs.  Make sure that there are handrails on both sides of the stairs. Fix handrails that are broken or loose. Make sure that handrails are as long as the stairways.  Check any carpeting to make sure that it is firmly attached to the stairs. Fix any carpet that is loose or worn.  Avoid having throw rugs at the top or bottom of stairways, or secure the rugs with carpet tape to prevent them from moving.  Make sure that you have a light switch at the top of the stairs and the bottom of the stairs. If you do not have them, have them installed. What are some other fall prevention tips?  Wear closed-toe shoes that fit well and support your feet. Wear shoes that have rubber soles or low heels.  When you use a stepladder, make sure that it is completely opened and that the sides are firmly locked. Have someone hold the ladder while you are using   it. Do not climb a closed stepladder.  Add color or contrast paint or tape to grab bars and handrails in your home. Place contrasting color strips on the first and last steps.  Use mobility aids as needed, such as canes, walkers, scooters, and crutches.  Turn on lights if it is dark. Replace any light bulbs that burn out.  Set up furniture so that there are clear paths. Keep the furniture in the same spot.  Fix any uneven floor surfaces.  Choose a carpet design that does not hide the edge of steps of a stairway.  Be aware of any and all pets.  Review your medicines with your healthcare provider. Some medicines can cause dizziness or changes in blood pressure, which increase your risk of falling. Talk with  your health care provider about other ways that you can decrease your risk of falls. This may include working with a physical therapist or trainer to improve your strength, balance, and endurance. This information is not intended to replace advice given to you by your health care provider. Make sure you discuss any questions you have with your health care provider. Document Released: 03/21/2002 Document Revised: 08/28/2015 Document Reviewed: 05/05/2014 Elsevier Interactive Patient Education  2017 Elsevier Inc.  

## 2016-04-03 NOTE — Progress Notes (Signed)
Reason for visit: Gait disorder  Kylie Calhoun is an 65 y.o. female  History of present illness:  Kylie Calhoun is a 65 year old right-handed white female with a history of a chronic gait disorder. The patient has had problems with walking for the last 3 or 4 years, she has been relatively stable since last seen. She has not sustained any falls. The patient does have a history of urinary incontinence and difficulty with bladder control, but she has a history of bladder cancer. She has had issues with the bladder since that time. The patient is on Plavix for a history of cerebrovascular disease. The patient has had resolution of the numbness of her hands and arms following the stroke. The patient returns to the office at this point for reevaluation. She believes that her walking has been relatively stable.  Past Medical History:  Diagnosis Date  . 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 Ankle fracture, left   . Bladder cancer ([place]) 2011   3 times  . Diverticulosis    mild per colonoscopy  . Dyslipidemia   . Hemorrhoid    internal per colonoscopy 2016  . Hx of osteopenia   . Stroke [facility])     Past Surgical History:  Procedure Laterality Date  . ABDOMINAL HYSTERECTOMY    . APPENDECTOMY    . BLADDER SURGERY     scope and removal of tumors  . 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 REFRACTIVE SURGERY    . TONSILLECTOMY      Family History  Problem Relation Age of Onset  . Heart disease Father   . Stroke Sister   . Hypertension Sister   . Stroke Brother   . Hypertension Brother     Social history:  reports that she has never smoked. She has never used smokeless tobacco. She reports that she drinks alcohol. She reports that she does not use drugs.   No Known Allergies  Medications:  Prior to Admission medications   Medication Sig Start Date End Date Taking? Authorizing Provider  atorvastatin (LIPITOR) 20 MG tablet Take 1 tablet by  mouth daily at 6 PM.  03/21/15  Yes Historical Provider, MD  clopidogrel (PLAVIX) 75 MG tablet TAKE 1 TABLET(75 MG) BY MOUTH DAILY 03/27/16  Yes Kathrynn Ducking, MD    ROS:  Out of a complete 14 system review of symptoms, the patient complains only of the following symptoms, and all other reviewed systems are negative.  Bruising easily Dizziness Gait instability  Blood pressure 130/82, pulse 73, resp. rate 14, height 5\' 3"  (1.6 m), weight 129 lb (58.5 kg).  Physical Exam  General: The patient is alert and cooperative at the time of the examination.  Skin: No significant peripheral edema is noted.   Neurologic Exam  Mental status: The patient is alert and oriented x 3 at the time of the examination. The patient has apparent normal recent and remote memory, with an apparently normal attention span and concentration ability.   Cranial nerves: Facial symmetry is present. Speech is normal, no aphasia or dysarthria is noted. Extraocular movements are full. Visual fields are full.  Motor: The patient has good strength in all 4 extremities.  Sensory examination: Soft touch sensation is symmetric on the face, arms, and legs.  Coordination: The patient has good finger-nose-finger and heel-to-shin bilaterally.  Gait and station: The patient has a wide-based, unsteady gait. Tandem gait is unsteady. Romberg  is negative. No drift is seen. A timed walk test with 6 laps of 20 feet was performed and 26 seconds.  Reflexes: Deep tendon reflexes are symmetric.   Assessment/Plan:  1. Gait disturbance, possible normal pressure hydrocephalus  The patient appears to be stable with her ability to walk at this point. We will continue to follow her over time, I will check back in about 6 months. If the patient has declined in her ability to ambulate, we will recheck the MRI of the brain and consider a high-volume lumbar puncture.  Jill Alexanders MD 04/03/2016 11:01 AM  [facility] [address] [place],  [address]  Phone [phone] Fax [phone]

## 2016-04-22 ENCOUNTER — Telehealth: Payer: Self-pay | Admitting: Neurology

## 2016-04-22 NOTE — Telephone Encounter (Signed)
I called patient. The patient wishes to be evaluated for the possibility of normal pressure hydrocephalus. I'm not sure that there is any big change in her walking since I saw her in December, she just feels sleepy and fatigued all the time, not sure this is a symptom of normal pressure hydrocephalus.  We will get a revisit, look at the walking with a timed walk test, and consider lumbar puncture.

## 2016-04-22 NOTE — Telephone Encounter (Signed)
Pt called said she is ready to have shunt, she said her lifestyle is changing daily-she has increased dizziness, light headed, and is wanting to sleep more. Please call

## 2016-04-23 NOTE — Telephone Encounter (Signed)
Called pt and work-in appt scheduled Mon 1/22 w/ noon arrival time.

## 2016-05-05 ENCOUNTER — Encounter: Payer: Self-pay | Admitting: Neurology

## 2016-05-05 ENCOUNTER — Ambulatory Visit (INDEPENDENT_AMBULATORY_CARE_PROVIDER_SITE_OTHER): Payer: Medicare Other | Admitting: Neurology

## 2016-05-05 VITALS — BP 133/76 | HR 81 | Ht 63.0 in | Wt 131.5 lb

## 2016-05-05 DIAGNOSIS — R2681 Unsteadiness on feet: Secondary | ICD-10-CM | POA: Diagnosis not present

## 2016-05-05 NOTE — Progress Notes (Signed)
Reason for visit: Gait disorder  Kylie Calhoun is an 66 y.o. female  History of present illness:  Kylie Calhoun is a 66 year old right-handed white female with a history of a gait disorder. The patient has some ventriculomegaly by CT and MRI of the brain. The patient has been followed for her ability to ambulate, she has been relatively stable until the last 2 or 3 weeks. The patient has had a change in her ability to ambulate. She has become more unsteady, she feels cloudy headed, somewhat drowsy. There have been no changes in medications. The patient has a sensation of dizziness, she walks with a cane, she denies any recent falls. She continues to have urinary urgency, but no change in bladder function has been noted. The patient comes back to the office at this point for an urgent revisit.  Past Medical History:  Diagnosis Date  . 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 Ankle fracture, left   . Bladder cancer ([place]) 2011   3 times  . Diverticulosis    mild per colonoscopy  . Dyslipidemia   . Hemorrhoid    internal per colonoscopy 2016  . Hx of osteopenia   . Stroke [facility])     Past Surgical History:  Procedure Laterality Date  . ABDOMINAL HYSTERECTOMY    . APPENDECTOMY    . BLADDER SURGERY     scope and removal of tumors  . 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 REFRACTIVE SURGERY    . TONSILLECTOMY      Family History  Problem Relation Age of Onset  . Heart disease Father   . Stroke Sister   . Hypertension Sister   . Stroke Brother   . Hypertension Brother     Social history:  reports that she has never smoked. She has never used smokeless tobacco. She reports that she drinks alcohol. She reports that she does not use drugs.   No Known Allergies  Medications:  Prior to Admission medications   Medication Sig Start Date End Date Taking? Authorizing Provider  atorvastatin (LIPITOR) 20 MG tablet Take 1 tablet by mouth daily  at 6 PM.  03/21/15   Historical Provider, MD  clopidogrel (PLAVIX) 75 MG tablet TAKE 1 TABLET(75 MG) BY MOUTH DAILY 03/27/16   Kathrynn Ducking, MD    ROS:  Out of a complete 14 system review of symptoms, the patient complains only of the following symptoms, and all other reviewed systems are negative.  Decreased activity, fatigue Memory loss, dizziness, weakness Depression  Blood pressure 133/76, pulse 81, height 5\' 3"  (1.6 m), weight 131 lb 8 oz (59.6 kg).  Physical Exam  General: The patient is alert and cooperative at the time of the examination.  Skin: No significant peripheral edema is noted.   Neurologic Exam  Mental status: The patient is alert and oriented x 3 at the time of the examination. The patient has apparent normal recent and remote memory, with an apparently normal attention span and concentration ability.   Cranial nerves: Facial symmetry is present. Speech is normal, no aphasia or dysarthria is noted. Extraocular movements are full. Visual fields are full.  Motor: The patient has good strength in all 4 extremities.  Sensory examination: Soft touch sensation is symmetric on the face, arms, and legs.  Coordination: The patient has good finger-nose-finger and heel-to-shin bilaterally.  Gait and station: The patient has a wide-based, unsteady gait, the  patient walks with a cane. Romberg is unsteady. No drift is seen. A timed walk test with 6 laps of 20 feet was performed in 46 seconds.  Reflexes: Deep tendon reflexes are symmetric.   Assessment/Plan:  1. Gait disorder  2. Ventriculomegaly  The patient has had a clear change in her ability to ambulate, in December 2017, her timed walk test with 6 laps of 20 feet was 26 seconds, now it is 46 seconds, this is a significant delay from what was seen previously. The patient has also reported cognitive changes. The patient will be set up for MRI of the brain, and she will have a high-volume lumbar puncture. We  will see her back following the lumbar puncture and to reevaluate her walking.  Jill Alexanders MD 05/05/2016 12:35 PM  [facility] [address] [place], [place] [address]  Phone [phone] Fax [phone]

## 2016-05-05 NOTE — Patient Instructions (Signed)
   We will get MRI of the brain and get a spinal tap.

## 2016-05-08 ENCOUNTER — Telehealth: Payer: Self-pay

## 2016-05-08 ENCOUNTER — Telehealth: Payer: Self-pay | Admitting: *Deleted

## 2016-05-08 NOTE — Telephone Encounter (Signed)
Pt can return at any time the day after LP on 05/15/16 for timed walk test.

## 2016-05-08 NOTE — Telephone Encounter (Signed)
Patient's LP has been scheduled for 05/14/16.  Dr. Jannifer Franklin would like to see her the same day for a quick appt.  Called the patient and left a message for a return call.

## 2016-05-08 NOTE — Telephone Encounter (Signed)
-----   Message from Kathrynn Ducking, MD sent at 05/05/2016  7:34 PM EST ----- I need a RV the day after the LP is to be done. Thanks, Just need a quick visit at the end of the day.

## 2016-05-08 NOTE — Telephone Encounter (Signed)
Spoke to patient - she has been added to the schedule on 05/15/16 with Dr. Jannifer Franklin.

## 2016-05-08 NOTE — Telephone Encounter (Signed)
Orders to stop Plavix 5 days prior to lumbar puncture faxed to [place] # [phone].

## 2016-05-08 NOTE — Telephone Encounter (Signed)
Patient returning nurse's call.  Please call

## 2016-05-10 ENCOUNTER — Ambulatory Visit
Admission: RE | Admit: 2016-05-10 | Discharge: 2016-05-10 | Disposition: A | Discharge status: Home / Self Care | Payer: Medicare Other | Source: Ambulatory Visit | Attending: Neurology | Admitting: Neurology

## 2016-05-10 DIAGNOSIS — R2681 Unsteadiness on feet: Secondary | ICD-10-CM | POA: Diagnosis not present

## 2016-05-12 ENCOUNTER — Telehealth: Payer: Self-pay | Admitting: Neurology

## 2016-05-12 NOTE — Telephone Encounter (Signed)
I called the patient. The MRI done shows no real difference from the prior study. We will see how the patient does following the LP.   MRI brain 05/10/16:  IMPRESSION:  This MRI of the brain without contrast shows the following: 1.    Multiple T2/FLAIR hyperintense foci in the cerebral hemispheres, pons, left thalamus and left cerebellum consistent with moderate chronic microvascular ischemic change. Compared to the 09/02/2015 MRI, there does not appear to be any new lesions. The small acute stroke noted on the prior MRI appears chronic on the current study. 2.    Moderate cortical atrophy. 3.    There are no acute findings.

## 2016-05-14 ENCOUNTER — Ambulatory Visit
Admission: RE | Admit: 2016-05-14 | Discharge: 2016-05-14 | Disposition: A | Discharge status: Home / Self Care | Payer: Medicare Other | Source: Ambulatory Visit | Attending: Neurology | Admitting: Neurology

## 2016-05-14 VITALS — BP 118/67 | HR 67

## 2016-05-14 DIAGNOSIS — R2681 Unsteadiness on feet: Secondary | ICD-10-CM

## 2016-05-14 LAB — CSF CELL COUNT WITH DIFFERENTIAL
RBC COUNT CSF: 0 {cells}/uL (ref 0–10)
WBC CSF: 0 {cells}/uL (ref 0–5)

## 2016-05-14 LAB — GLUCOSE, CSF: GLUCOSE CSF: 64 mg/dL (ref 43–76)

## 2016-05-14 LAB — PROTEIN, CSF: Total Protein, CSF: 37 mg/dL (ref 15–60)

## 2016-05-14 NOTE — Discharge Instructions (Signed)
Lumbar Puncture Discharge Instructions  1. Go home and rest quietly for the next 24 hours.  It is important to lie flat for the next 24 hours.  Get up only to go to the restroom.  You may lie in the bed or on a couch on your back, your stomach, your left side or your right side.  You may have one pillow under your head.  You may have pillows between your knees while you are on your side or under your knees while you are on your back.  2. DO NOT drive today.  Recline the seat as far back as it will go, while still wearing your seat belt, on the way home.  3. You may get up to go to the bathroom as needed.  You may sit up for 10 minutes to eat.  You may resume your normal diet and medications unless otherwise indicated.  Drink plenty of extra fluids today and tomorrow.  4. The incidence of a spinal headache with nausea and/or vomiting is about 5% (one in 20 patients).  If you develop a headache, lie flat and drink plenty of fluids until the headache goes away.  Caffeinated beverages may be helpful.  If you develop severe nausea and vomiting or a headache that does not go away with flat bed rest, please call the physician who sent you here.  5. You may resume normal activities after your 24 hours of bed rest is over; however, do not exert yourself strongly or do any heavy lifting tomorrow.  Please call us at [phone] with any questions or concerns.  6. Call your physician for a follow-up appointment.    You may resume Plavix today.

## 2016-05-15 ENCOUNTER — Encounter: Payer: Self-pay | Admitting: Neurology

## 2016-05-15 ENCOUNTER — Ambulatory Visit (INDEPENDENT_AMBULATORY_CARE_PROVIDER_SITE_OTHER): Payer: Medicare Other | Admitting: Neurology

## 2016-05-15 VITALS — BP 150/86 | HR 79 | Ht 63.0 in | Wt 131.0 lb

## 2016-05-15 DIAGNOSIS — G912 (Idiopathic) normal pressure hydrocephalus: Secondary | ICD-10-CM | POA: Diagnosis not present

## 2016-05-15 DIAGNOSIS — R2681 Unsteadiness on feet: Secondary | ICD-10-CM

## 2016-05-15 NOTE — Progress Notes (Signed)
Reason for visit: Gait disorder  Kylie Calhoun is an 66 y.o. female  History of present illness:  Kylie Calhoun is a 66 year old right-handed white female with a history of a gait disorder. The patient has had ventriculomegally by MRI of the brain. The patient was seen on 05/05/2016 with a 2 or 3 week history of a significant alteration in her functional level. The patient has been seen in December 2017, she was able to walk relatively well, she performed a timed walk test with 6 laps of 20 feet in 26 seconds. The patient performed the same walking test on 05/05/2016 in 46 seconds. The patient also has had some clouding of consciousness and decreased processing speed. The patient has a sensation of dizziness. For this reason, the patient was set up for a repeat MRI of the brain, but no acute changes were seen. The patient does have some mild small vessel ischemic changes. The patient was set up for a lumbar puncture with a high-volume spinal tap. The patient has noted a decrease in the dizziness and improvement in cognitive processing. She feels more sure of herself with walking. The spinal tap was done yesterday. She comes back into the office today for reevaluation.  Past Medical History:  Diagnosis Date  . 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 Ankle fracture, left   . Bladder cancer ([place]) 2011   3 times  . Diverticulosis    mild per colonoscopy  . Dyslipidemia   . Hemorrhoid    internal per colonoscopy 2016  . Hx of osteopenia   . Stroke [facility])     Past Surgical History:  Procedure Laterality Date  . ABDOMINAL HYSTERECTOMY    . APPENDECTOMY    . BLADDER SURGERY     scope and removal of tumors  . 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 REFRACTIVE SURGERY    . TONSILLECTOMY      Family History  Problem Relation Age of Onset  . Heart disease Father   . Stroke Sister   . Hypertension Sister   . Stroke Brother   . Hypertension  Brother     Social history:  reports that she has never smoked. She has never used smokeless tobacco. She reports that she drinks alcohol. She reports that she does not use drugs.   No Known Allergies  Medications:  Prior to Admission medications   Medication Sig Start Date End Date Taking? Authorizing Provider  atorvastatin (LIPITOR) 20 MG tablet Take 1 tablet by mouth daily at 6 PM.  03/21/15   Historical Provider, MD  clopidogrel (PLAVIX) 75 MG tablet TAKE 1 TABLET(75 MG) BY MOUTH DAILY 03/27/16   Kathrynn Ducking, MD    ROS:  Out of a complete 14 system review of symptoms, the patient complains only of the following symptoms, and all other reviewed systems are negative.  Gait disturbance Dizziness  Blood pressure (!) 150/86, pulse 79, height 5\' 3"  (1.6 m), weight 131 lb (59.4 kg).  Physical Exam  General: The patient is alert and cooperative at the time of the examination.  Skin: No significant peripheral edema is noted.   Neurologic Exam  Mental status: The patient is alert and oriented x 3 at the time of the examination. The patient has apparent normal recent and remote memory, with an apparently normal attention span and concentration ability.   Cranial nerves: Facial symmetry is present. Speech is normal, no aphasia  or dysarthria is noted.   Motor: The patient has good strength in all 4 extremities.  Sensory examination: Soft touch sensation is symmetric on the face, arms, and legs.  Gait and station: The patient has a wide-based gait, the patient walks with a cane. The patient performed a timed walk test without the cane with 6 laps of 20 feet. She perform this task in 30 seconds.  Reflexes: Deep tendon reflexes are symmetric.   Assessment/Plan:  1. 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 She  has gone from a timed walk test of 6 laps of 20 feet in 46 seconds to the same distance in 30 seconds. Her ability to process information and answer questions clearly has improved from her last evaluation in January. This appears to have had a positive response to the lumbar puncture. The patient wishes to consider a neurosurgical evaluation. I will refer the patient to [facility].  Jill Alexanders MD 05/15/2016 5:21 PM  [facility] [address] [place], [place] [address]  Phone [phone] Fax [phone]

## 2016-05-21 ENCOUNTER — Telehealth: Payer: Self-pay | Admitting: Neurology

## 2016-05-21 DIAGNOSIS — R42 Dizziness and giddiness: Secondary | ICD-10-CM

## 2016-05-21 NOTE — Telephone Encounter (Signed)
Patient called in reference to Dr. Tennis Must wanting patient to be referred to an ENT specialist to be sure there is no fluid on her ears.  Patient prefers [facility].  Please call

## 2016-05-21 NOTE — Telephone Encounter (Signed)
I called the patient. The patient was seen by Dr. Johnney Killian for evaluation of possible normal pressure hydrocephalus. Dr. Johnney Killian was concerned about the dizziness associated with the gait problem. He wanted the patient to have an ENT evaluation for this. I will try to get this set up. The patient will be scheduled for another lumbar puncture next week through [facility].

## 2016-05-21 NOTE — Addendum Note (Signed)
Addended by: Margette Fast on: 05/21/2016 05:48 PM   Modules accepted: Orders

## 2016-05-21 NOTE — Telephone Encounter (Signed)
Dr Jannifer Franklin- is this something you can do? Looks like you referred her to Dr Johnney Killian on 05/15/16

## 2016-06-19 DIAGNOSIS — Z8673 Personal history of transient ischemic attack (TIA), and cerebral infarction without residual deficits: Secondary | ICD-10-CM | POA: Insufficient documentation

## 2016-06-19 DIAGNOSIS — Z8551 Personal history of malignant neoplasm of bladder: Secondary | ICD-10-CM | POA: Insufficient documentation

## 2016-06-26 ENCOUNTER — Other Ambulatory Visit: Payer: Self-pay | Admitting: Neurology

## 2016-06-26 DIAGNOSIS — I639 Cerebral infarction, unspecified: Secondary | ICD-10-CM

## 2016-06-27 ENCOUNTER — Other Ambulatory Visit: Payer: Self-pay

## 2016-06-27 DIAGNOSIS — I639 Cerebral infarction, unspecified: Secondary | ICD-10-CM

## 2016-06-27 MED ORDER — CLOPIDOGREL BISULFATE 75 MG PO TABS
ORAL_TABLET | ORAL | 2 refills | Status: DC
Start: 2016-06-27 — End: 2017-05-01

## 2016-07-02 ENCOUNTER — Encounter (HOSPITAL_COMMUNITY): Payer: Self-pay | Admitting: *Deleted

## 2016-07-02 ENCOUNTER — Emergency Department (HOSPITAL_COMMUNITY): Payer: Medicare Other

## 2016-07-02 ENCOUNTER — Emergency Department (HOSPITAL_COMMUNITY)
Admission: EM | Admit: 2016-07-02 | Discharge: 2016-07-02 | Disposition: A | Discharge status: Other Acute Inpatient Hospital | Payer: Medicare Other | Attending: Emergency Medicine | Admitting: Emergency Medicine

## 2016-07-02 DIAGNOSIS — Z8673 Personal history of transient ischemic attack (TIA), and cerebral infarction without residual deficits: Secondary | ICD-10-CM | POA: Insufficient documentation

## 2016-07-02 DIAGNOSIS — Z8551 Personal history of malignant neoplasm of bladder: Secondary | ICD-10-CM | POA: Insufficient documentation

## 2016-07-02 DIAGNOSIS — Z982 Presence of cerebrospinal fluid drainage device: Secondary | ICD-10-CM | POA: Diagnosis not present

## 2016-07-02 DIAGNOSIS — R55 Syncope and collapse: Secondary | ICD-10-CM | POA: Diagnosis present

## 2016-07-02 LAB — CBC WITH DIFFERENTIAL/PLATELET
Basophils Absolute: 0 10*3/uL (ref 0.0–0.1)
Basophils Relative: 0 %
EOS ABS: 0.4 10*3/uL (ref 0.0–0.7)
EOS PCT: 5 %
HCT: 37.5 % (ref 36.0–46.0)
Hemoglobin: 12.2 g/dL (ref 12.0–15.0)
LYMPHS ABS: 1.7 10*3/uL (ref 0.7–4.0)
LYMPHS PCT: 20 %
MCH: 29 pg (ref 26.0–34.0)
MCHC: 32.5 g/dL (ref 30.0–36.0)
MCV: 89.1 fL (ref 78.0–100.0)
Monocytes Absolute: 0.8 10*3/uL (ref 0.1–1.0)
Monocytes Relative: 10 %
Neutro Abs: 5.4 10*3/uL (ref 1.7–7.7)
Neutrophils Relative %: 65 %
PLATELETS: 181 10*3/uL (ref 150–400)
RBC: 4.21 MIL/uL (ref 3.87–5.11)
RDW: 13.5 % (ref 11.5–15.5)
WBC: 8.3 10*3/uL (ref 4.0–10.5)

## 2016-07-02 LAB — URINALYSIS, ROUTINE W REFLEX MICROSCOPIC
Bacteria, UA: NONE SEEN
Bilirubin Urine: NEGATIVE
GLUCOSE, UA: NEGATIVE mg/dL
HGB URINE DIPSTICK: NEGATIVE
KETONES UR: NEGATIVE mg/dL
Nitrite: NEGATIVE
PROTEIN: NEGATIVE mg/dL
Specific Gravity, Urine: 1.009 (ref 1.005–1.030)
pH: 7 (ref 5.0–8.0)

## 2016-07-02 LAB — BASIC METABOLIC PANEL
ANION GAP: 14 (ref 5–15)
BUN: 8 mg/dL (ref 6–20)
CO2: 20 mmol/L — ABNORMAL LOW (ref 22–32)
Calcium: 8.6 mg/dL — ABNORMAL LOW (ref 8.9–10.3)
Chloride: 108 mmol/L (ref 101–111)
Creatinine, Ser: 0.86 mg/dL (ref 0.44–1.00)
GFR calc Af Amer: >60 mL/min (ref >60)
GLUCOSE: 114 mg/dL — AB (ref 65–99)
POTASSIUM: 3.6 mmol/L (ref 3.5–5.1)
Sodium: 142 mmol/L (ref 135–145)

## 2016-07-02 LAB — CBG MONITORING, ED: Glucose-Capillary: 124 mg/dL — ABNORMAL HIGH (ref 65–99)

## 2016-07-02 LAB — I-STAT TROPONIN, ED: TROPONIN I, POC: 0 ng/mL (ref 0.00–0.08)

## 2016-07-02 NOTE — ED Provider Notes (Signed)
[place] DEPT Provider Note   CSN: [id number] Arrival date & time: 07/02/16  8921     History   Chief Complaint Chief Complaint  Patient presents with  . Loss of Consciousness    HPI Jabree Pernice is a 66 y.o. female.  The history is provided by the patient and medical records. No language interpreter was used.  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 Johnney Killian, discharged home yesterday, here with syncope. Patient reports she feels like she was doing fairly well after the surgery. She is back home, her son is staying with her to help monitor her over the next few days. States she ate dinner last night as normal and went to bed. Today she got up this morning and was walking downstairs to go feed her cat, however, when she got to the bottom of the stairs and into the kitchen, she began feeling very lightheaded as if she may pass out. States she called her son on the cell phone and, he came downstairs to be with her. States she does not recall fully losing consciousness, however, her son states she did. She denies any falls or direct head injury.  States she just feels somewhat tired this point. She is concerned she may have gotten dehydrated. HEENT have a mild headache, however, this is unchanged post surgery. She denies any new numbness, weakness, confusion, changes in vision, difficulty speaking, gait disturbance, chest pain, shortness of breath, abdominal pain, nausea, or vomiting.Patient is currently off of her Plavix, was told she needed to stay off of it another week.  Patient did receive about 250cc of IVF with EMS.  Past Medical History:  Diagnosis Date  . 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 Ankle fracture, left   . Bladder cancer ([place]) 2011   3 times  .  Diverticulosis    mild per colonoscopy  . Dyslipidemia   . Hemorrhoid    internal per colonoscopy 2016  . Hx of osteopenia   . Stroke [facility])     Patient Active Problem List   Diagnosis Date Noted  . 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 Stroke ([place]) 03/20/2015  . Stroke with cerebral ischemia ([place])   . HLD (hyperlipidemia)   . Unsteady gait 03/19/2015  . Hx of osteopenia   . Bladder cancer ([place])   . Hemorrhoid   . Diverticulosis   . Ischemic stroke ([place])   . Malignant neoplasm of urinary bladder [facility])     Past Surgical History:  Procedure Laterality Date  . ABDOMINAL HYSTERECTOMY    . APPENDECTOMY    . BLADDER SURGERY     scope and removal of tumors  . 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 REFRACTIVE SURGERY    . TONSILLECTOMY      OB History    Gravida Para Term Preterm AB Living   3             SAB TAB Ectopic Multiple Live Births                   Home Medications    Prior to Admission medications   Medication Sig Start Date End Date Taking? Authorizing Provider  atorvastatin (LIPITOR) 20 MG tablet Take 1 tablet by mouth  daily at 6 PM.  03/21/15   Historical Provider, MD  clopidogrel (PLAVIX) 75 MG tablet TAKE 1 TABLET(75 MG) BY MOUTH DAILY 06/27/16   Kathrynn Ducking, MD    Family History Family History  Problem Relation Age of Onset  . Heart disease Father   . Stroke Sister   . Hypertension Sister   . Stroke Brother   . Hypertension Brother     Social History Social History  Substance Use Topics  . Smoking status: Never Smoker  . Smokeless tobacco: Never Used  . Alcohol use Yes     Comment: Occasional      Allergies   Patient has no known allergies.   Review of Systems Review of Systems  Cardiovascular: Positive for syncope.  All other systems reviewed and are negative.    Physical Exam Updated Vital Signs BP (!) 100/58   Pulse 79   Temp 98.6 F (37 C) (Oral)   Ht 5\' 3"  (1.6 m)    Wt 64.9 kg   SpO2 96%   BMI 25.33 kg/m   Physical Exam  Constitutional: She is oriented to person, place, and time. She appears well-developed and well-nourished. No distress.  HENT:  Head: Normocephalic and atraumatic.  Right Ear: External ear normal.  Left Ear: External ear normal.  Mouth/Throat: Oropharynx is clear and moist.  Bandages noted to the right occipital scalp and left frontal scalp, there is no bruising around the eyes or behind the ears, no evidence of new bleed or trauma, mid-face is stable  Eyes: Conjunctivae and EOM are normal. Pupils are equal, round, and reactive to light.  Pupils symmetric and reactive bilaterally, EOMs intact, no nystagmus  Neck: Normal range of motion and full passive range of motion without pain. Neck supple. No neck rigidity.  No rigidity, no meningismus  Cardiovascular: Normal rate, regular rhythm and normal heart sounds.   No murmur heard. Pulmonary/Chest: Effort normal and breath sounds normal. No respiratory distress. She has no wheezes. She has no rhonchi.  Abdominal: Soft. Bowel sounds are normal. There is no tenderness. There is no rebound and no guarding.  Musculoskeletal: Normal range of motion. She exhibits no edema.  Neurological: She is alert and oriented to person, place, and time. She has normal strength. She displays no tremor. No cranial nerve deficit or sensory deficit. She displays no seizure activity.  AAOx3, answering questions and following commands appropriately; equal strength UE and LE bilaterally; CN grossly intact; moves all extremities appropriately without ataxia; no focal neuro deficits or facial asymmetry appreciated  Skin: Skin is warm and dry. No rash noted. She is not diaphoretic.  Psychiatric: She has a normal mood and affect. Her behavior is normal. Thought content normal.  Nursing note and vitals reviewed.    ED Treatments / Results  Labs (all labs ordered are listed, but only abnormal results are  displayed) Labs Reviewed  CBG MONITORING, ED - Abnormal; Notable for the following:       Result Value   Glucose-Capillary 124 (*)    All other components within normal limits  BASIC METABOLIC PANEL  URINALYSIS, ROUTINE W REFLEX MICROSCOPIC  CBC WITH DIFFERENTIAL/PLATELET  I-STAT TROPOININ, ED    EKG  EKG Interpretation None       Radiology No results found.  Procedures Procedures (including critical care time)  Medications Ordered in ED Medications - No data to display   Initial Impression / Assessment and Plan / ED Course  I have reviewed the triage vital signs  and the nursing notes.  Pertinent labs & imaging results that were available during my care of the patient were reviewed by me and considered in my medical decision making (see chart for details).  66 year old female here with syncope.  Recent VP shunt placement on 06/30/2016, discharged yesterday. Syncope was preceded by lightheadedness. No direct head trauma. Patient is awake, alert, fully oriented on arrival. She has no apparent focal neurologic deficits. Bandages from recent surgery. Remain in place without any evidence of acute bleeding. EKG with new Q waves inferiorly. Patient denies any chest pain or shortness of breath. Her vitals are stable. Lab work including troponin is reassuring. Chest x-ray is clear. CT head with appropriate placement of shunt catheter, there is note of small lacunar infarct in the left thalamus, which was not noticed on prior CT scan from the past 2 days. Patient has remained neurologically intact here. Given that she is recent postop shunt placement, home for less than 24 hours with syncope with new q-waves on EKG and lacunar infarct on CT, feel she warrants observation.  Discussed transfer to [facility], however patient would prefer to stay here close by her family if no acute intervention with her shunt required.  Will discuss with hospitalist.  After discussion with hospitalist team who has  spoken with our neurology department, it is felt that patient would benefit most from readmission at [facility] so that she can be evaluated by her neurosurgeon given new findings on CT. I have discussed this with the [facility] transfer line, patient's neurosurgeon, Dr. Johnney Killian will accept in transfer.  Prefers ED to ED transfer, he will evaluate upon her arrival.  Karen Chafe was contacted for transport.  Imagines today uploaded onto CD to transport with patient.  12:33 PM carelink has arrived for transport.  Patient remains stable at this time.  Final Clinical Impressions(s) / ED Diagnoses   Final diagnoses:  Syncope, unspecified syncope type  S/P VP shunt    New Prescriptions New Prescriptions   No medications on file     Larene Pickett, Hershal Coria 07/02/16 [place], MD 07/02/16 [phone]

## 2016-07-02 NOTE — ED Notes (Signed)
Pt CBG was 124, notified Wendy(RN)

## 2016-07-02 NOTE — ED Notes (Signed)
[facility] at bedside to transport patient. 

## 2016-07-02 NOTE — ED Triage Notes (Signed)
Pt in from home with hx of recent brain surgery at [facility] for hydrocephalus, pt states ,"I had a stroke 2 years ago and I guess it came from that." pt had syncopal episode today witnessed by son, EMS did not report pt hitting head, pt A&O x4 upon arrival to ED, pt rcvd 300 mL NS pta, A&O x4

## 2016-07-02 NOTE — ED Notes (Signed)
EKG given to Dr Pickering 

## 2016-07-02 NOTE — ED Notes (Signed)
Dr. Merrell at bedside. 

## 2016-09-11 ENCOUNTER — Emergency Department (HOSPITAL_COMMUNITY)
Admission: EM | Admit: 2016-09-11 | Discharge: 2016-09-11 | Discharge status: Against Medical Advice | Payer: Medicare Other | Attending: Emergency Medicine | Admitting: Emergency Medicine

## 2016-09-11 ENCOUNTER — Telehealth: Payer: Self-pay | Admitting: Neurology

## 2016-09-11 ENCOUNTER — Encounter (HOSPITAL_COMMUNITY): Payer: Self-pay | Admitting: Emergency Medicine

## 2016-09-11 DIAGNOSIS — R51 Headache: Secondary | ICD-10-CM | POA: Diagnosis not present

## 2016-09-11 DIAGNOSIS — G451 Carotid artery syndrome (hemispheric): Secondary | ICD-10-CM

## 2016-09-11 DIAGNOSIS — Z5321 Procedure and treatment not carried out due to patient leaving prior to being seen by health care provider: Secondary | ICD-10-CM | POA: Insufficient documentation

## 2016-09-11 DIAGNOSIS — R202 Paresthesia of skin: Secondary | ICD-10-CM | POA: Insufficient documentation

## 2016-09-11 LAB — CBC WITH DIFFERENTIAL/PLATELET
BASOS PCT: 0 %
Basophils Absolute: 0 10*3/uL (ref 0.0–0.1)
Eosinophils Absolute: 0.1 10*3/uL (ref 0.0–0.7)
Eosinophils Relative: 1 %
HEMATOCRIT: 41.4 % (ref 36.0–46.0)
HEMOGLOBIN: 13.8 g/dL (ref 12.0–15.0)
Lymphocytes Relative: 32 %
Lymphs Abs: 2.1 10*3/uL (ref 0.7–4.0)
MCH: 29.4 pg (ref 26.0–34.0)
MCHC: 33.3 g/dL (ref 30.0–36.0)
MCV: 88.3 fL (ref 78.0–100.0)
MONOS PCT: 9 %
Monocytes Absolute: 0.6 10*3/uL (ref 0.1–1.0)
NEUTROS ABS: 3.9 10*3/uL (ref 1.7–7.7)
NEUTROS PCT: 58 %
Platelets: 264 10*3/uL (ref 150–400)
RBC: 4.69 MIL/uL (ref 3.87–5.11)
RDW: 13.8 % (ref 11.5–15.5)
WBC: 6.7 10*3/uL (ref 4.0–10.5)

## 2016-09-11 LAB — COMPREHENSIVE METABOLIC PANEL
ALBUMIN: 3.9 g/dL (ref 3.5–5.0)
ALK PHOS: 71 U/L (ref 38–126)
ALT: 26 U/L (ref 14–54)
ANION GAP: 9 (ref 5–15)
AST: 25 U/L (ref 15–41)
BILIRUBIN TOTAL: 1.1 mg/dL (ref 0.3–1.2)
BUN: 14 mg/dL (ref 6–20)
CALCIUM: 9.3 mg/dL (ref 8.9–10.3)
CO2: 22 mmol/L (ref 22–32)
CREATININE: 0.92 mg/dL (ref 0.44–1.00)
Chloride: 109 mmol/L (ref 101–111)
GFR calc Af Amer: >60 mL/min (ref >60)
GFR calc non Af Amer: >60 mL/min (ref >60)
GLUCOSE: 130 mg/dL — AB (ref 65–99)
Potassium: 3.6 mmol/L (ref 3.5–5.1)
Sodium: 140 mmol/L (ref 135–145)
TOTAL PROTEIN: 6.5 g/dL (ref 6.5–8.1)

## 2016-09-11 NOTE — Telephone Encounter (Signed)
Patient called office in reference to having left arm numbness and it is concerning the patient.  Please call

## 2016-09-11 NOTE — Telephone Encounter (Signed)
I called patient. [facility], she has had episodic numbness of the left arm off and on over the last 1 week. The episodes may last a few minutes and then get better without weakness or clumsiness or numbness elsewhere. The patient will be evaluated through the ER.  She is on Plavix and she began aspirin recently.

## 2016-09-11 NOTE — ED Triage Notes (Addendum)
Pt c/o left arm numbness off and on x's 1 week off and on.  St's she developed a headache approx 2 hours ago  Pt st's she had a shunt placed in her head in March 2018 for hydrocephalus

## 2016-09-11 NOTE — ED Notes (Signed)
Pt states wanting to leave due to symptoms have resolved, encouraged patient to stay for full ED visit. Pt states headache is gone and no numbness in the arm. Educated patient on signs and symptoms of stroke and encouraged her to call 911 in emergency. Pt alert and oriented, ambulatory.

## 2016-09-11 NOTE — Telephone Encounter (Signed)
Pt w/ history of stroke. Returned call - reports intermittent, left arm numbness over the last week.  Also, developed a moderate headache this afternoon (pain peaked at 6 on pain scale).  Headache lasted for one hour prior to resolving without treatment.  Says she has an unsteady gait but this is not a new symptom for her.  She denies weakness of face, arm or legs, no vision difficulty or vision difficulty.  States the onset of these symptoms over the last week prompted her to add aspirin 81mg  daily to her medication regimen.

## 2016-09-12 DIAGNOSIS — R42 Dizziness and giddiness: Secondary | ICD-10-CM | POA: Insufficient documentation

## 2016-09-12 NOTE — Telephone Encounter (Signed)
Pt is calling back in to inform that the wait was so long at the ED she left. Pt said she woke up this morning feeling okay but fears the feeling may come back.  Pt is asking to be called back to discuss suggestions. Pt wants it known that she feels like her blood pressue may be spiking and could be causing this, she said when she took it yesterday it was 165/89 before the ED.

## 2016-09-12 NOTE — Telephone Encounter (Signed)
I called the patient. The patient has had a VP shunt placement for normal pressure hydrocephalus. She does have a history of cerebrovascular disease in the past.  The patient's had episodes over the last week of left arm numbness. I will check a CT of the brain, carotid Doppler study, she will be seen in office within the next 2 or 3 weeks.  She is on aspirin and Plavix.

## 2016-09-12 NOTE — Addendum Note (Signed)
Addended by: Kathrynn Ducking on: 09/12/2016 10:33 AM   Modules accepted: Orders

## 2016-09-15 ENCOUNTER — Emergency Department (HOSPITAL_COMMUNITY)
Admission: EM | Admit: 2016-09-15 | Discharge: 2016-09-15 | Disposition: A | Discharge status: Home / Self Care | Payer: Medicare Other | Attending: Emergency Medicine | Admitting: Emergency Medicine

## 2016-09-15 ENCOUNTER — Encounter (HOSPITAL_COMMUNITY): Payer: Self-pay | Admitting: *Deleted

## 2016-09-15 ENCOUNTER — Emergency Department (HOSPITAL_COMMUNITY): Payer: Medicare Other

## 2016-09-15 DIAGNOSIS — Z5181 Encounter for therapeutic drug level monitoring: Secondary | ICD-10-CM | POA: Insufficient documentation

## 2016-09-15 DIAGNOSIS — Z79899 Other long term (current) drug therapy: Secondary | ICD-10-CM | POA: Diagnosis not present

## 2016-09-15 DIAGNOSIS — R200 Anesthesia of skin: Secondary | ICD-10-CM | POA: Diagnosis not present

## 2016-09-15 DIAGNOSIS — Z7982 Long term (current) use of aspirin: Secondary | ICD-10-CM | POA: Diagnosis not present

## 2016-09-15 DIAGNOSIS — Z8673 Personal history of transient ischemic attack (TIA), and cerebral infarction without residual deficits: Secondary | ICD-10-CM | POA: Insufficient documentation

## 2016-09-15 DIAGNOSIS — Z8551 Personal history of malignant neoplasm of bladder: Secondary | ICD-10-CM | POA: Diagnosis not present

## 2016-09-15 LAB — APTT: APTT: 30 s (ref 24–36)

## 2016-09-15 LAB — DIFFERENTIAL
BASOS ABS: 0 10*3/uL (ref 0.0–0.1)
BASOS PCT: 1 %
Eosinophils Absolute: 0.1 10*3/uL (ref 0.0–0.7)
Eosinophils Relative: 1 %
LYMPHS ABS: 1.9 10*3/uL (ref 0.7–4.0)
LYMPHS PCT: 31 %
MONOS PCT: 9 %
Monocytes Absolute: 0.6 10*3/uL (ref 0.1–1.0)
NEUTROS ABS: 3.7 10*3/uL (ref 1.7–7.7)
Neutrophils Relative %: 58 %

## 2016-09-15 LAB — CBC
HEMATOCRIT: 43.8 % (ref 36.0–46.0)
HEMOGLOBIN: 14.4 g/dL (ref 12.0–15.0)
MCH: 29.2 pg (ref 26.0–34.0)
MCHC: 32.9 g/dL (ref 30.0–36.0)
MCV: 88.8 fL (ref 78.0–100.0)
Platelets: 263 10*3/uL (ref 150–400)
RBC: 4.93 MIL/uL (ref 3.87–5.11)
RDW: 13.6 % (ref 11.5–15.5)
WBC: 6.4 10*3/uL (ref 4.0–10.5)

## 2016-09-15 LAB — COMPREHENSIVE METABOLIC PANEL
ALBUMIN: 4.4 g/dL (ref 3.5–5.0)
ALT: 24 U/L (ref 14–54)
ANION GAP: 9 (ref 5–15)
AST: 27 U/L (ref 15–41)
Alkaline Phosphatase: 71 U/L (ref 38–126)
BUN: 13 mg/dL (ref 6–20)
CHLORIDE: 108 mmol/L (ref 101–111)
CO2: 24 mmol/L (ref 22–32)
Calcium: 9.7 mg/dL (ref 8.9–10.3)
Creatinine, Ser: 0.94 mg/dL (ref 0.44–1.00)
GFR calc non Af Amer: >60 mL/min (ref >60)
GLUCOSE: 111 mg/dL — AB (ref 65–99)
POTASSIUM: 3.8 mmol/L (ref 3.5–5.1)
SODIUM: 141 mmol/L (ref 135–145)
Total Bilirubin: 1.7 mg/dL — ABNORMAL HIGH (ref 0.3–1.2)
Total Protein: 6.7 g/dL (ref 6.5–8.1)

## 2016-09-15 LAB — PROTIME-INR
INR: 1
Prothrombin Time: 13.2 seconds (ref 11.4–15.2)

## 2016-09-15 LAB — I-STAT TROPONIN, ED: Troponin i, poc: 0 ng/mL (ref 0.00–0.08)

## 2016-09-15 NOTE — ED Provider Notes (Signed)
[place] DEPT Provider Note   CSN: [id number] Arrival date & time: 09/15/16  1013     History   Chief Complaint Chief Complaint  Patient presents with  . Numbness    HPI Kylie Calhoun is a 66 y.o. female.  HPI   66 year old female with PMH of CVA in 2016 without deficits, shunt placed in March at [facility] for normal pressure hydrocephalus, and hyperlipidemia who presents with 1-2 weeks of left arm numbness. Numbness starts approximately halfway down the left upper arm and extends to the hand. Numbness has not been constant. It comes and goes; lasts about 10-15 minutes each time it occurs. On average, happening about 3-4 times per day. Numbness currently absent but was present when she woke up this morning, prompting her to come to the ED. She was triaged in the ED 4 days prior but left before being seen by a provider as her symptoms had resolved.   Denies associated extremity weakness. Has had headaches throughout this past week, most recently about 3-4 days ago. Denies changes in vision, facial droop, speech changes, and confusion. Is endorsing a funny sensation of questionable numbness to her tongue that started within the past 1-2 days. She takes Lipitor and Plavix. She added a baby ASA when the left arm numbness started.   She denies neck pain or recent neck strain. She denies chest pain and SOB. Denies fevers and has otherwise been feeling well.   Past Medical History:  Diagnosis Date  . 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 Ankle fracture, left   . Bladder cancer ([place]) 2011   3 times  . Diverticulosis    mild per colonoscopy  . Dyslipidemia   . Hemorrhoid    internal per colonoscopy 2016  . Hx of osteopenia   . Stroke [facility])     Patient Active Problem List   Diagnosis Date Noted  . 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 Stroke ([place]) 03/20/2015  . Stroke with cerebral ischemia ([place])   . HLD (hyperlipidemia)   .  Unsteady gait 03/19/2015  . Hx of osteopenia   . Bladder cancer ([place])   . Hemorrhoid   . Diverticulosis   . Ischemic stroke ([place])   . Malignant neoplasm of urinary bladder [facility])     Past Surgical History:  Procedure Laterality Date  . ABDOMINAL HYSTERECTOMY    . APPENDECTOMY    . BLADDER SURGERY     scope and removal of tumors  . BRAIN SURGERY    . 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 REFRACTIVE SURGERY    . TONSILLECTOMY      OB History    Gravida Para Term Preterm AB Living   3             SAB TAB Ectopic Multiple Live Births                   Home Medications    Prior to Admission medications   Medication Sig Start Date End Date Taking? Authorizing Provider  aspirin EC 81 MG tablet Take 81 mg by mouth daily.   Yes [provider]  atorvastatin (LIPITOR) 20 MG tablet Take 20 mg by mouth daily at 6 PM.  03/21/15  Yes [provider]  cholecalciferol (VITAMIN D) 1000 units tablet Take 1,000 Units by mouth daily.   Yes [provider]  clopidogrel (PLAVIX) 75  MG tablet TAKE 1 TABLET(75 MG) BY MOUTH DAILY 06/27/16  Yes Kathrynn Ducking, MD  Multiple Vitamin (MULTIVITAMIN WITH MINERALS) TABS tablet Take 1 tablet by mouth daily.   Yes [provider]    Family History Family History  Problem Relation Age of Onset  . Heart disease Father   . Stroke Sister   . Hypertension Sister   . Stroke Brother   . Hypertension Brother     Social History Social History  Substance Use Topics  . Smoking status: Never Smoker  . Smokeless tobacco: Never Used  . Alcohol use Yes     Comment: Occasional      Allergies   Patient has no known allergies.   Review of Systems Review of Systems: Per HPI    Physical Exam Updated Vital Signs BP 108/80   Pulse 80   Temp 97.7 F (36.5 C)   Resp 20   SpO2 99%   Physical Exam  Constitutional: She is oriented to person, place, and time. She appears well-developed and  well-nourished. No distress.  HENT:  Head: Normocephalic and atraumatic.  Mouth/Throat: Oropharynx is clear and moist.  Eyes: Conjunctivae and EOM are normal. Pupils are equal, round, and reactive to light.  Neck: Normal range of motion. Neck supple.  Negative Spurling's bilaterally. No TTP over the cervical spine or paraspinal muscles.   Cardiovascular: Normal rate, regular rhythm and normal heart sounds.   No murmur heard. Pulmonary/Chest: Effort normal and breath sounds normal. No respiratory distress.  Abdominal: Soft. Bowel sounds are normal. She exhibits no distension. There is no tenderness.  Musculoskeletal: Normal range of motion. She exhibits no edema.  Neurological: She is alert and oriented to person, place, and time.  CN II-XII intact. Strength 5/5 in all extremities. Sensation grossly intact to all extremities. Normal nose to finger testing bilaterally.   Skin: Skin is warm and dry.  Psychiatric: She has a normal mood and affect.     ED Treatments / Results  Labs (all labs ordered are listed, but only abnormal results are displayed) Labs Reviewed  COMPREHENSIVE METABOLIC PANEL - Abnormal; Notable for the following:       Result Value   Glucose, Bld 111 (*)    Total Bilirubin 1.7 (*)    All other components within normal limits  PROTIME-INR  APTT  CBC  DIFFERENTIAL  I-STAT TROPOININ, ED    EKG  EKG Interpretation  Date/Time:  Monday September 15 2016 10:35:31 EDT Ventricular Rate:  78 PR Interval:  118 QRS Duration: 68 QT Interval:  384 QTC Calculation: 437 R Axis:   54 Text Interpretation:  Normal sinus rhythm Normal ECG Confirmed by RAY MD, Andee Poles ([id number]) on 09/15/2016 11:50:46 AM       Radiology Ct Head Wo Contrast  Result Date: 09/15/2016 CLINICAL DATA:  66 year old female with history of numbness in weakness in the left arm and hand for the past week. EXAM: CT HEAD WITHOUT CONTRAST TECHNIQUE: Contiguous axial images were obtained from the base of the  skull through the vertex without intravenous contrast. COMPARISON:  Head CT 07/02/2016. FINDINGS: Brain: Right parietal ventriculostomy shunt catheter with tip terminating in the right lateral ventricle near the midline. Mild cerebral atrophy. Patchy and confluent areas of decreased attenuation are noted throughout the deep and periventricular white matter of the cerebral hemispheres bilaterally, compatible with chronic microvascular ischemic disease. No evidence of acute infarction, hemorrhage, hydrocephalus, extra-axial collection or mass lesion/mass effect. Vascular: No hyperdense vessel or unexpected calcification. Skull:  Normal. Negative for fracture or focal lesion. Sinuses/Orbits: No acute finding. Other: None. IMPRESSION: 1. No acute intracranial abnormalities. 2. Mild cerebral atrophy with chronic microvascular ischemic changes in the cerebral white matter redemonstrated, as above. 3. Right sided parietal ventriculostomy shunt catheter appears stable in position. No hydrocephalus. Electronically Signed   By: Vinnie Langton M.D.   On: 09/15/2016 12:32    Procedures Procedures (including critical care time)  Medications Ordered in ED Medications - No data to display   Initial Impression / Assessment and Plan / ED Course  I have reviewed the triage vital signs and the nursing notes.  Pertinent labs & imaging results that were available during my care of the patient were reviewed by me and considered in my medical decision making (see chart for details).     66 year old female with PMH of CVA in 2016 (left thalamic lacunar infarct) and recent placement of shunt for normal pressure hydrocephalus presents with >1 week of intermittent left arm numbness and headaches. Patient asymptomatic at time of evaluation. CBC unremarkable. EKG NSR and tropin negative. CT head without acute changes. Concern for recurrent TIAs.   Patient with negative carotid dopplers in 2016 and normal echo in 2017.  Discussed case with on-call Neurohospitalist, Dr. Shon Hale who stated that patient was already optimally medically managed with dual anti-platelet therapy and statin. Recommended outpatient neurology follow up. Patient already has neurology appointment scheduled with Dr. Jannifer Franklin at [facility] on 6/22. I have recommended that patient call to have this appointment moved up. Return precautions that would warrant re-evaluation in ER discussed. Patient feels comfortable returning to home with planned outpatient follow up.   Final Clinical Impressions(s) / ED Diagnoses   Final diagnoses:  Left arm numbness    New Prescriptions New Prescriptions   No medications on file     Nicolette Bang, DO 09/15/16 [place], [place], DO 09/15/16 [place], [place], DO 09/15/16 1513    Pattricia Boss, MD 09/22/16 [phone]

## 2016-09-15 NOTE — ED Triage Notes (Addendum)
Pt reports intermittent episodes of numbness to left arm and hand. reports the episodes have been going on for weeks. Was seen here on Friday but left prior to being seen. Woke up this am with the numbness. Has hx of stroke and shunt placed in March. Grips are equal at triage.

## 2016-09-15 NOTE — Discharge Instructions (Signed)
Please call Dr. Jannifer Franklin to have your appointment scheduled ASAP. If you have any new neurological symptoms (extremity weakness, facial droop, confusion, speech changes, etc) please return. You can always return to ED for re-evaluation if numbness in your left arm worsens or lasts longer than usual.

## 2016-09-16 ENCOUNTER — Telehealth: Payer: Self-pay | Admitting: Neurology

## 2016-09-16 NOTE — Telephone Encounter (Signed)
Called and spoke with patient. Went to ED yesterday. She stayed most of the day. Had CT head completed. Discharged her home yesterday afternoon. Focusing on relaxing. She was told to f/u asap with Dr Jannifer Franklin.  Only experienced one episode of arm numbness this am.  Scheduled appt for 09/18/16 at 10am, check in 930 am. Patient verbalized understanding. She was instructed to bring insurance cards, copay and updated med list with her to appt.

## 2016-09-16 NOTE — Telephone Encounter (Signed)
Patient was seen at [facility] ED yesterday for left arm numbness. She has an appointment with Dr. Jannifer Franklin on 10-03-16 but was advised by the hospital that she needs to be seen sooner.

## 2016-09-18 ENCOUNTER — Encounter: Payer: Self-pay | Admitting: Neurology

## 2016-09-18 ENCOUNTER — Ambulatory Visit (INDEPENDENT_AMBULATORY_CARE_PROVIDER_SITE_OTHER): Payer: Medicare Other | Admitting: Neurology

## 2016-09-18 VITALS — BP 138/82 | HR 72 | Ht 63.0 in | Wt 129.5 lb

## 2016-09-18 DIAGNOSIS — R202 Paresthesia of skin: Secondary | ICD-10-CM | POA: Diagnosis not present

## 2016-09-18 DIAGNOSIS — R200 Anesthesia of skin: Secondary | ICD-10-CM | POA: Diagnosis not present

## 2016-09-18 DIAGNOSIS — R2681 Unsteadiness on feet: Secondary | ICD-10-CM

## 2016-09-18 DIAGNOSIS — G912 (Idiopathic) normal pressure hydrocephalus: Secondary | ICD-10-CM | POA: Diagnosis not present

## 2016-09-18 HISTORY — DX: (Idiopathic) normal pressure hydrocephalus: G91.2

## 2016-09-18 NOTE — Patient Instructions (Signed)
   We will check an EEG study and get a carotid doppler study.

## 2016-09-18 NOTE — Progress Notes (Signed)
Reason for visit: Normal pressure hydrocephalus  Kylie Calhoun is an 66 y.o. female  History of present illness:  Kylie Calhoun is a 66 year old right-handed white female with a history of normal pressure hydrocephalus, she had a right-sided VP shunt placement on 06/29/2016. The patient is recovering from this. Within the last 2-3 weeks she has developed episodic left arm numbness unassociated with any other symptoms of weakness, clumsiness, she has no visual changes or speech changes or gait disturbances with the events. The patient does have a history of cerebrovascular disease. She has had a CT angiogram of the head and neck done 1 year ago that showed some stenosis of the left posterior cerebral artery but otherwise the study was unremarkable. The patient has gone to the emergency room on 09/15/2016 with an episode of left-sided arm numbness. CT scan of the brain was unremarkable. The patient has had improvement in cognitive capacity with a VP shunt placement. The numbness when it does come on mainly involves the left hand and sometimes the forearm. If she moves her arms during the event, this will help the sensory symptoms. The sensation problems resolved completely. The patient does have some intermittent headaches associated with blood pressure elevations. She comes to the office today for an urgent evaluation.  Past Medical History:  Diagnosis Date  . 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 Ankle fracture, left   . Bladder cancer ([place]) 2011   3 times  . Diverticulosis    mild per colonoscopy  . Dyslipidemia   . Hemorrhoid    internal per colonoscopy 2016  . Hx of osteopenia   . Stroke [name])     Past Surgical History:  Procedure Laterality Date  . ABDOMINAL HYSTERECTOMY    . APPENDECTOMY    . BLADDER SURGERY     scope and removal of tumors  . BRAIN SURGERY    . 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 REFRACTIVE SURGERY    .  TONSILLECTOMY      Family History  Problem Relation Age of Onset  . Heart disease Father   . Stroke Sister   . Hypertension Sister   . Stroke Brother   . Hypertension Brother     Social history:  reports that she has never smoked. She has never used smokeless tobacco. She reports that she drinks alcohol. She reports that she does not use drugs.   No Known Allergies  Medications:  Prior to Admission medications   Medication Sig Start Date End Date Taking? Authorizing Provider  aspirin EC 81 MG tablet Take 81 mg by mouth daily.   Yes [provider]  atorvastatin (LIPITOR) 20 MG tablet Take 20 mg by mouth daily at 6 PM.  03/21/15  Yes [provider]  cholecalciferol (VITAMIN D) 1000 units tablet Take 1,000 Units by mouth daily.   Yes [provider]  clopidogrel (PLAVIX) 75 MG tablet TAKE 1 TABLET(75 MG) BY MOUTH DAILY 06/27/16  Yes Kathrynn Ducking, MD  Multiple Vitamin (MULTIVITAMIN WITH MINERALS) TABS tablet Take 1 tablet by mouth daily.   Yes [provider]    ROS:  Out of a complete 14 system review of symptoms, the patient complains only of the following symptoms, and all other reviewed systems are negative.  Dizziness, headache Left arm numbness  Blood pressure 138/82, pulse 72, height 5\' 3"  (1.6 m), weight 129 lb 8 oz (58.7 kg).  Physical Exam  General: The patient is alert and cooperative at the time of the examination.  Eyes: Pupils are equal round, and reactive to light. Discs are flat bilaterally.  Neck: Neck is supple, no carotid bruits are noted.  Respiratory examination: Lung fields are clear.  Cardiovascular examination: Regular rate and rhythm, no murmurs or rubs are noted.  Skin: No significant peripheral edema is noted.   Neurologic Exam  Mental status: The patient is alert and oriented x 3 at the time of the examination. The patient has apparent normal recent and remote memory, with an apparently normal  attention span and concentration ability.   Cranial nerves: Facial symmetry is present. Speech is normal, no aphasia or dysarthria is noted. Extraocular movements are full. Visual fields are full. Pinprick sensation on the face is symmetric.  Motor: The patient has good strength in all 4 extremities.  Sensory examination: Soft touch sensation is symmetric on the face, arms, and legs. Pinprick and vibratory sensation are symmetric on the arms and legs.  Coordination: The patient has good finger-nose-finger and heel-to-shin bilaterally.  Gait and station: The patient has a slightly wide-based gait, some hesitancy with turns. Tandem gait was not attempted. Romberg is negative. No drift is seen.  Reflexes: Deep tendon reflexes are symmetric.   CT head 09/15/16:  IMPRESSION: 1. No acute intracranial abnormalities. 2. Mild cerebral atrophy with chronic microvascular ischemic changes in the cerebral white matter redemonstrated, as above. 3. Right sided parietal ventriculostomy shunt catheter appears stable in position. No hydrocephalus.  * CT scan images were reviewed online. I agree with the written report.    Assessment/Plan:  1. Normal pressure and sepsis, right VP shunt placement  2. Episodic left arm numbness  3. History of cerebrovascular disease  The patient has had events of left arm numbness that will come and go over the last 2 or 3 weeks. The patient has no other associated symptoms. The patient will be set up for a carotid Doppler study, she is on aspirin and Plavix. The patient could potentially be having sensory seizures associated with the shunt replacement. The patient will have an EEG study, we may consider empiric therapy with Keppra. The patient will follow-up in 3 months. The VP shunt placement has clearly helped her cognitive processing and her gait.  Kylie Alexanders MD 09/18/2016 10:13 AM  [facility] [address] [place],  [place] [address]  Phone [phone] Fax [phone]

## 2016-10-03 ENCOUNTER — Ambulatory Visit: Payer: Medicare Other | Admitting: Neurology

## 2016-10-16 ENCOUNTER — Ambulatory Visit (INDEPENDENT_AMBULATORY_CARE_PROVIDER_SITE_OTHER): Payer: Medicare Other

## 2016-10-16 DIAGNOSIS — R2990 Unspecified symptoms and signs involving the nervous system: Secondary | ICD-10-CM | POA: Diagnosis not present

## 2016-10-16 DIAGNOSIS — R200 Anesthesia of skin: Secondary | ICD-10-CM

## 2016-10-20 ENCOUNTER — Ambulatory Visit (HOSPITAL_COMMUNITY)
Admission: RE | Admit: 2016-10-20 | Discharge: 2016-10-20 | Disposition: A | Discharge status: Home / Self Care | Payer: Medicare Other | Source: Ambulatory Visit | Attending: Neurology | Admitting: Neurology

## 2016-10-20 ENCOUNTER — Telehealth: Payer: Self-pay | Admitting: Neurology

## 2016-10-20 DIAGNOSIS — R200 Anesthesia of skin: Secondary | ICD-10-CM | POA: Diagnosis not present

## 2016-10-20 DIAGNOSIS — I6523 Occlusion and stenosis of bilateral carotid arteries: Secondary | ICD-10-CM | POA: Diagnosis not present

## 2016-10-20 MED ORDER — LEVETIRACETAM 250 MG PO TABS: 250.0000 mg | ORAL_TABLET | Freq: Two times a day (BID) | ORAL | 2 refills | Status: DC

## 2016-10-20 NOTE — Telephone Encounter (Signed)
I called patient. EEG study was normal, carotid Doppler study was unremarkable.  I called the patient, she is still having 2 episodes a day on average of the left arm numbness. I will give an empiric trial on Keppra.  Carotid Doppler study 10/20/16:  Vascular Ultrasound Carotid Duplex (Doppler) has been completed.  Preliminary findings: Bilateral: No significant (1-39%) ICA stenosis. Antegrade vertebral flow.

## 2016-10-20 NOTE — Progress Notes (Signed)
*  PRELIMINARY RESULTS* Vascular Ultrasound Carotid Duplex (Doppler) has been completed.  Preliminary findings: Bilateral: No significant (1-39%) ICA stenosis. Antegrade vertebral flow.    Landry Mellow, RDMS, RVT  10/20/2016, 2:22 PM

## 2016-10-20 NOTE — Procedures (Signed)
    History:  Kylie Calhoun is a 66 year old patient with a history normal pressure hydrocephalus, status post a right sided VP shunt placement in March 2018. The patient has had episodes of left arm numbness unassociated with those symptoms of weakness or clumsiness. The patient is being evaluated for possible sensory seizures.  This is a routine EEG. No skull defects are noted. Medications include aspirin, Lipitor, Plavix, and multivitamins.   EEG classification: Normal awake  Description of the recording: The background rhythms of this recording consists of a fairly well modulated medium amplitude alpha rhythm of 8 Hz that is reactive to eye opening and closure. As the record progresses, the patient appears to remain in the waking state throughout the recording. Photic stimulation was performed, resulting in a bilateral and symmetric photic driving response. Hyperventilation was also performed, resulting in a minimal buildup of the background rhythm activities without significant slowing seen. At no time during the recording does there appear to be evidence of spike or spike wave discharges or evidence of focal slowing. EKG monitor shows no evidence of cardiac rhythm abnormalities with a heart rate of 78.  Impression: This is a normal EEG recording in the waking state. No evidence of ictal or interictal discharges are seen.

## 2016-10-21 LAB — VAS US CAROTID
LCCADDIAS: -16 cm/s
LCCADSYS: -67 cm/s
LEFT ECA DIAS: -9 cm/s
LEFT VERTEBRAL DIAS: 11 cm/s
LICADSYS: -71 cm/s
LICAPDIAS: -34 cm/s
Left CCA prox dias: 19 cm/s
Left CCA prox sys: 71 cm/s
Left ICA dist dias: -26 cm/s
Left ICA prox sys: -136 cm/s
RCCADSYS: -88 cm/s
RCCAPDIAS: 22 cm/s
RIGHT ECA DIAS: -6 cm/s
RIGHT VERTEBRAL DIAS: 17 cm/s
Right CCA prox sys: 78 cm/s

## 2016-12-19 ENCOUNTER — Ambulatory Visit (INDEPENDENT_AMBULATORY_CARE_PROVIDER_SITE_OTHER): Payer: Medicare Other | Admitting: Neurology

## 2016-12-19 ENCOUNTER — Encounter: Payer: Self-pay | Admitting: Neurology

## 2016-12-19 VITALS — BP 135/81 | HR 59 | Ht 63.0 in | Wt 127.0 lb

## 2016-12-19 DIAGNOSIS — R2681 Unsteadiness on feet: Secondary | ICD-10-CM | POA: Diagnosis not present

## 2016-12-19 DIAGNOSIS — G912 (Idiopathic) normal pressure hydrocephalus: Secondary | ICD-10-CM

## 2016-12-19 NOTE — Progress Notes (Signed)
Reason for visit: Normal pressure hydrocephalus  Kylie Calhoun is an 66 y.o. female  History of present illness:  Ms. Kylie Calhoun is a 66 year old right-handed white female with a history of normal pressure hydrocephalus, status post VP shunt placement on the right. The patient had some episodes of left-sided numbness, she was given an empiric trial on Keppra for possible sensory seizures, but she could not tolerate the medication secondary to severe dizziness. The patient over time has noted a gradual decline in the frequency and severity the episodes of numbness, she is still having some events, however. She has had stabilization and some improvement in the ability to walk, her cognitive abilities improved, she is involved in 2 different book clubs and she is doing well with this. The patient exercises daily. She has not had any falls, she no longer uses a cane for ambulation. She remains on Plavix. She returns for an evaluation.  Past Medical History:  Diagnosis Date  . 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 Ankle fracture, left   . Bladder cancer ([place]) 2011   3 times  . Diverticulosis    mild per colonoscopy  . Dyslipidemia   . Hemorrhoid    internal per colonoscopy 2016  . Hx of osteopenia   . NPH (normal pressure hydrocephalus) 09/18/2016  . Stroke [facility])     Past Surgical History:  Procedure Laterality Date  . ABDOMINAL HYSTERECTOMY    . APPENDECTOMY    . BLADDER SURGERY     scope and removal of tumors  . BRAIN SURGERY    . 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 REFRACTIVE SURGERY    . TONSILLECTOMY      Family History  Problem Relation Age of Onset  . Heart disease Father   . Stroke Sister   . Hypertension Sister   . Stroke Brother   . Hypertension Brother     Social history:  reports that she has never smoked. She has never used smokeless tobacco. She reports that she drinks alcohol. She reports that she does not use  drugs.   No Known Allergies  Medications:  Prior to Admission medications   Medication Sig Start Date End Date Taking? Authorizing Provider  atorvastatin (LIPITOR) 20 MG tablet Take 20 mg by mouth daily at 6 PM.  03/21/15  Yes [provider]  cholecalciferol (VITAMIN D) 1000 units tablet Take 1,000 Units by mouth daily.   Yes [provider]  clopidogrel (PLAVIX) 75 MG tablet TAKE 1 TABLET(75 MG) BY MOUTH DAILY 06/27/16  Yes Kathrynn Ducking, MD  Multiple Vitamin (MULTIVITAMIN WITH MINERALS) TABS tablet Take 1 tablet by mouth daily.   Yes [provider]    ROS:  Out of a complete 14 system review of symptoms, the patient complains only of the following symptoms, and all other reviewed systems are negative.  Dizziness Episodic numbness  Blood pressure 135/81, pulse (!) 59, height 5\' 3"  (1.6 m), weight 127 lb (57.6 kg).  Physical Exam  General: The patient is alert and cooperative at the time of the examination.  Skin: No significant peripheral edema is noted.   Neurologic Exam  Mental status: The patient is alert and oriented x 3 at the time of the examination. The patient has apparent normal recent and remote memory, with an apparently normal attention span and concentration ability.   Cranial nerves: Facial symmetry is present. Speech is normal, no aphasia  or dysarthria is noted. Extraocular movements are full. Visual fields are full.  Motor: The patient has good strength in all 4 extremities.  Sensory examination: Soft touch sensation is symmetric on the face, arms, and legs.  Coordination: The patient has good finger-nose-finger and heel-to-shin bilaterally.  Gait and station: The patient has a slightly wide-based gait. Tandem gait is unsteady. Romberg is negative. No drift is seen.  Reflexes: Deep tendon reflexes are symmetric.    Carotid Doppler study 10/20/16:  Vascular Ultrasound Carotid Duplex (Doppler)has been completed.  Preliminary findings: Bilateral: No significant (1-39%) ICA stenosis. Antegrade vertebral flow.   Assessment/Plan:  1. Normal pressure hydrocephalus  2. Gait disturbance  3. History of cerebrovascular disease  The patient has done better with her ability to walk and with her cognitive abilities. She still has some baseline mild gait instability but she is able to walk independently. The episodes of left-sided numbness are gradually improving even off Keppra. The patient will follow-up in 6 months. Overall, the patient is pleased with her surgical outcome.   Jill Alexanders MD 12/19/2016 10:57 AM  [facility] [address] [place], [place] [address]  Phone [phone] Fax [phone]

## 2017-05-01 ENCOUNTER — Other Ambulatory Visit: Payer: Self-pay | Admitting: Neurology

## 2017-05-01 DIAGNOSIS — I639 Cerebral infarction, unspecified: Secondary | ICD-10-CM

## 2017-06-15 ENCOUNTER — Telehealth: Payer: Self-pay | Admitting: *Deleted

## 2017-06-15 NOTE — Telephone Encounter (Signed)
Called and spoke with pt. Offered appt tomorrow at 730am since she was on wait list. She is out of town until Sat. She is unable to take appt. Advised I will call back if anything else becomes available. She verbalized understanding.

## 2017-06-19 ENCOUNTER — Ambulatory Visit: Payer: Medicare Other | Admitting: Neurology

## 2017-09-14 ENCOUNTER — Ambulatory Visit: Payer: Medicare Other | Admitting: Neurology

## 2017-09-14 ENCOUNTER — Other Ambulatory Visit: Payer: Self-pay

## 2017-09-14 ENCOUNTER — Encounter

## 2017-09-14 ENCOUNTER — Encounter: Payer: Self-pay | Admitting: Neurology

## 2017-09-14 VITALS — BP 143/90 | HR 70 | Ht 63.0 in | Wt 128.5 lb

## 2017-09-14 DIAGNOSIS — G912 (Idiopathic) normal pressure hydrocephalus: Secondary | ICD-10-CM | POA: Diagnosis not present

## 2017-09-14 NOTE — Progress Notes (Signed)
Reason for visit: Normal pressure hydrocephalus  Kylie Calhoun is an 67 y.o. female  History of present illness:  Kylie Calhoun is a 67 year old right-handed white female with a history of normal pressure hydrocephalus associated with a gait disorder.  The patient has had a VP shunt placement, she has done well with this.  The patient is working out on a regular basis, she has a Physiological scientist.  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  She believes that she is slowly and gradually improving.  She still has episodes of the left arm numbness that may occur once every day or 2, but the episodes of brief and are not bothersome to her.  She was given a trial on Keppra but she could not tolerate the medication and had to stop it secondary to dizziness.  The patient has not had any falls, she does not use an assistive device to ambulate.  Past Medical History:  Diagnosis Date  . 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 Ankle fracture, left   . Bladder cancer ([place]) 2011   3 times  . Diverticulosis    mild per colonoscopy  . Dyslipidemia   . Hemorrhoid    internal per colonoscopy 2016  . Hx of osteopenia   . NPH (normal pressure hydrocephalus) 09/18/2016  . Stroke [facility])     Past Surgical History:  Procedure Laterality Date  . ABDOMINAL HYSTERECTOMY    . APPENDECTOMY    . BLADDER SURGERY     scope and removal of tumors  . BRAIN SURGERY    . 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 REFRACTIVE SURGERY    . TONSILLECTOMY      Family History  Problem Relation Age of Onset  . Heart disease Father   . Stroke Sister   . Hypertension Sister   . Stroke Brother   . Hypertension Brother     Social history:  reports that she has never smoked. She has never used smokeless tobacco. She reports that she drinks alcohol. She reports that she does not use drugs.    Allergies  Allergen Reactions  . Keppra [Levetiracetam]     dizzy    Medications:  Prior to  Admission medications   Medication Sig Start Date End Date Taking? Authorizing Provider  atorvastatin (LIPITOR) 20 MG tablet Take 20 mg by mouth daily at 6 PM.  03/21/15  Yes [provider]  cholecalciferol (VITAMIN D) 1000 units tablet Take 1,000 Units by mouth daily.   Yes [provider]  clopidogrel (PLAVIX) 75 MG tablet TAKE 1 TABLET(75 MG) BY MOUTH DAILY 05/04/17  Yes Kathrynn Ducking, MD  Melatonin 10 MG TABS Take 1 tablet by mouth at bedtime.   Yes [provider]  Multiple Vitamin (MULTIVITAMIN WITH MINERALS) TABS tablet Take 1 tablet by mouth daily.   Yes [provider]    ROS:  Out of a complete 14 system review of symptoms, the patient complains only of the following symptoms, and all other reviewed systems are negative.  Dizziness  Blood pressure (!) 143/90, pulse 70, height 5\' 3"  (1.6 m), weight 128 lb 8 oz (58.3 kg).  Physical Exam  General: The patient is alert and cooperative at the time of the examination.  Skin: No significant peripheral edema is noted.   Neurologic Exam  Mental status: The patient is alert and oriented x 3 at the time of the examination. The patient  has apparent normal recent and remote memory, with an apparently normal attention span and concentration ability.   Cranial nerves: Facial symmetry is present. Speech is normal, no aphasia or dysarthria is noted. Extraocular movements are full. Visual fields are full.  Motor: The patient has good strength in all 4 extremities.  Sensory examination: Soft touch sensation is symmetric on the face, arms, and legs.  Coordination: The patient has good finger-nose-finger and heel-to-shin bilaterally.  Gait and station: The patient has a slightly wide-based gait, she has had hesitancy with turning.  Tandem gait is minimally unsteady. Romberg is negative. No drift is seen.  Reflexes: Deep tendon reflexes are symmetric.   Assessment/Plan:  1.  Normal pressure  hydrocephalus  2.  Gait disorder  The patient still has some mild residual problems with walking, but she is stable now with her ability to ambulate, she has done well.  The patient will follow-up here in about 1 year, sooner if needed.  Jill Alexanders MD 09/14/2017 2:56 PM  [facility] [address] [place], [place] [address]  Phone [phone] Fax [phone]

## 2017-10-05 ENCOUNTER — Telehealth: Payer: Self-pay | Admitting: Neurology

## 2017-10-05 NOTE — Telephone Encounter (Signed)
Patient states she has had recurring diarrhea for the past 2 weeks. She has a brain shunt which she thinks is causing diarrhea and would like a call back from Dr. Jannifer Franklin. She did not want to call [facility].

## 2017-10-05 NOTE — Telephone Encounter (Signed)
Pt called she has been in touch with PCP and he doesn't think it has anything to do with the shunt. She is asking to please disregard the call back.

## 2017-10-26 ENCOUNTER — Other Ambulatory Visit: Payer: Self-pay | Admitting: Neurology

## 2017-10-26 DIAGNOSIS — I639 Cerebral infarction, unspecified: Secondary | ICD-10-CM

## 2017-12-09 DIAGNOSIS — L299 Pruritus, unspecified: Secondary | ICD-10-CM | POA: Insufficient documentation

## 2018-03-22 ENCOUNTER — Other Ambulatory Visit (HOSPITAL_COMMUNITY): Payer: Self-pay | Admitting: *Deleted

## 2018-03-23 ENCOUNTER — Ambulatory Visit (HOSPITAL_COMMUNITY)
Admission: RE | Admit: 2018-03-23 | Discharge: 2018-03-23 | Disposition: A | Discharge status: Home / Self Care | Payer: Medicare Other | Source: Ambulatory Visit | Attending: Internal Medicine | Admitting: Internal Medicine

## 2018-03-23 DIAGNOSIS — M810 Age-related osteoporosis without current pathological fracture: Secondary | ICD-10-CM | POA: Diagnosis present

## 2018-03-23 MED ORDER — ZOLEDRONIC ACID 5 MG/100ML IV SOLN
5.0000 mg | Freq: Once | INTRAVENOUS | Status: AC
  Administered 2018-03-23: 5 mg via INTRAVENOUS

## 2018-03-23 MED ORDER — ZOLEDRONIC ACID 5 MG/100ML IV SOLN
INTRAVENOUS | Status: AC
  Administered 2018-03-23: 5 mg via INTRAVENOUS
  Filled 2018-03-23: qty 100

## 2018-03-23 NOTE — Discharge Instructions (Signed)

## 2018-04-24 ENCOUNTER — Other Ambulatory Visit: Payer: Self-pay | Admitting: Neurology

## 2018-04-24 DIAGNOSIS — I639 Cerebral infarction, unspecified: Secondary | ICD-10-CM

## 2018-09-16 ENCOUNTER — Ambulatory Visit: Payer: Medicare Other | Admitting: Neurology

## 2018-09-26 ENCOUNTER — Other Ambulatory Visit: Payer: Self-pay

## 2018-09-26 ENCOUNTER — Emergency Department (HOSPITAL_COMMUNITY): Payer: Medicare Other

## 2018-09-26 ENCOUNTER — Emergency Department (HOSPITAL_COMMUNITY)
Admission: EM | Admit: 2018-09-26 | Discharge: 2018-09-26 | Disposition: A | Discharge status: Home / Self Care | Payer: Medicare Other | Attending: Emergency Medicine | Admitting: Emergency Medicine

## 2018-09-26 ENCOUNTER — Encounter (HOSPITAL_COMMUNITY): Payer: Self-pay | Admitting: Emergency Medicine

## 2018-09-26 DIAGNOSIS — R42 Dizziness and giddiness: Secondary | ICD-10-CM | POA: Insufficient documentation

## 2018-09-26 DIAGNOSIS — Z7901 Long term (current) use of anticoagulants: Secondary | ICD-10-CM | POA: Insufficient documentation

## 2018-09-26 DIAGNOSIS — Z8551 Personal history of malignant neoplasm of bladder: Secondary | ICD-10-CM | POA: Insufficient documentation

## 2018-09-26 DIAGNOSIS — G919 Hydrocephalus, unspecified: Secondary | ICD-10-CM | POA: Diagnosis not present

## 2018-09-26 DIAGNOSIS — Z982 Presence of cerebrospinal fluid drainage device: Secondary | ICD-10-CM | POA: Diagnosis not present

## 2018-09-26 DIAGNOSIS — Z79899 Other long term (current) drug therapy: Secondary | ICD-10-CM | POA: Insufficient documentation

## 2018-09-26 LAB — URINALYSIS, ROUTINE W REFLEX MICROSCOPIC
Bacteria, UA: NONE SEEN
Bilirubin Urine: NEGATIVE
Glucose, UA: NEGATIVE mg/dL
Ketones, ur: NEGATIVE mg/dL
Nitrite: NEGATIVE
Protein, ur: NEGATIVE mg/dL
Specific Gravity, Urine: 1.002 — ABNORMAL LOW (ref 1.005–1.030)
pH: 7 (ref 5.0–8.0)

## 2018-09-26 LAB — CBC
HCT: 44 % (ref 36.0–46.0)
Hemoglobin: 14.5 g/dL (ref 12.0–15.0)
MCH: 30 pg (ref 26.0–34.0)
MCHC: 33 g/dL (ref 30.0–36.0)
MCV: 90.9 fL (ref 80.0–100.0)
Platelets: 260 10*3/uL (ref 150–400)
RBC: 4.84 MIL/uL (ref 3.87–5.11)
RDW: 13.2 % (ref 11.5–15.5)
WBC: 6.3 10*3/uL (ref 4.0–10.5)
nRBC: 0 % (ref 0.0–0.2)

## 2018-09-26 LAB — COMPREHENSIVE METABOLIC PANEL
ALT: 18 U/L (ref 0–44)
AST: 21 U/L (ref 15–41)
Albumin: 4.1 g/dL (ref 3.5–5.0)
Alkaline Phosphatase: 70 U/L (ref 38–126)
Anion gap: 10 (ref 5–15)
BUN: 15 mg/dL (ref 8–23)
CO2: 24 mmol/L (ref 22–32)
Calcium: 9.5 mg/dL (ref 8.9–10.3)
Chloride: 102 mmol/L (ref 98–111)
Creatinine, Ser: 0.82 mg/dL (ref 0.44–1.00)
GFR calc Af Amer: >60 mL/min (ref >60)
GFR calc non Af Amer: >60 mL/min (ref >60)
Glucose, Bld: 109 mg/dL — ABNORMAL HIGH (ref 70–99)
Potassium: 3.7 mmol/L (ref 3.5–5.1)
Sodium: 136 mmol/L (ref 135–145)
Total Bilirubin: 1.3 mg/dL — ABNORMAL HIGH (ref 0.3–1.2)
Total Protein: 6.5 g/dL (ref 6.5–8.1)

## 2018-09-26 NOTE — ED Provider Notes (Signed)
[place] EMERGENCY DEPARTMENT Provider Note   CSN: [id number] Arrival date & time: 09/26/18  1341     History   Chief Complaint Chief Complaint  Patient presents with  . Dizziness    HPI Kylie Calhoun is a 68 y.o. female.     HPI  68 year old female history of normal pressure hydrocephalus, status post shunt placement 2018 at [facility], presents today complaining of some lightheadedness.  She reports that she has had a UTI diagnosed 1 week ago and has been taking Bactrim.  She reports that every morning she feels somewhat dizzy.  This morning she felt more lightheaded.  She also had some pain in the right side of her neck which has since resolved.  She denies any headache, vision changes, chest pain, dyspnea, nausea, vomiting, or diarrhea.  She reports taking her medications as prescribed.  She reports her gait being stable from prior.  She does intermittently use a cane but has not needed it today.  Past Medical History:  Diagnosis Date  . 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 Ankle fracture, left   . Bladder cancer ([place]) 2011   3 times  . Diverticulosis    mild per colonoscopy  . Dyslipidemia   . Hemorrhoid    internal per colonoscopy 2016  . Hx of osteopenia   . NPH (normal pressure hydrocephalus) ([place]) 09/18/2016  . Stroke [facility])     Patient Active Problem List   Diagnosis Date Noted  . NPH (normal pressure hydrocephalus) ([place]) 09/18/2016  . Left hand paresthesia 09/18/2016  . Left arm numbness 09/18/2016  . 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 Stroke ([place]) 03/20/2015  . Stroke with cerebral ischemia ([place])   . HLD (hyperlipidemia)   . Unsteady gait 03/19/2015  . Hx of osteopenia   . Bladder cancer ([place])   . Hemorrhoid   . Diverticulosis   . Ischemic stroke ([place])   . 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 ABDOMINAL HYSTERECTOMY     . APPENDECTOMY    . BLADDER SURGERY     scope and removal of tumors  . BRAIN SURGERY    . 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 REFRACTIVE SURGERY    . TONSILLECTOMY       OB History    Gravida  3   Para      Term      Preterm      AB      Living        SAB      TAB      Ectopic      Multiple      Live Births               Home Medications    Prior to Admission medications   Medication Sig Start Date End Date Taking? Authorizing Provider  atorvastatin (LIPITOR) 20 MG tablet Take 20 mg by mouth daily at 6 PM.  03/21/15   [provider]  cholecalciferol (VITAMIN D) 1000 units tablet Take 1,000 Units by mouth daily.    [provider]  clopidogrel (PLAVIX) 75 MG tablet TAKE 1 TABLET(75 MG) BY MOUTH DAILY 04/26/18   Kathrynn Ducking, MD  Melatonin 10 MG TABS Take 1 tablet by mouth at bedtime.    [provider]  Multiple  Vitamin (MULTIVITAMIN WITH MINERALS) TABS tablet Take 1 tablet by mouth daily.    [provider]    Family History Family History  Problem Relation Age of Onset  . Heart disease Father   . Stroke Sister   . Hypertension Sister   . Stroke Brother   . Hypertension Brother     Social History Social History   Tobacco Use  . Smoking status: Never Smoker  . Smokeless tobacco: Never Used  Substance Use Topics  . Alcohol use: Yes    Comment: Occasional   . Drug use: No     Allergies   Patient has no known allergies.   Review of Systems Review of Systems  All other systems reviewed and are negative.    Physical Exam Updated Vital Signs BP (!) 157/77 (BP Location: Right Arm)   Pulse 70   Temp 98.7 F (37.1 C) (Oral)   Resp 16   Ht 1.6 m (5\' 3" )   Wt 57.6 kg   SpO2 100%   BMI 22.50 kg/m   Physical Exam Vitals signs and nursing note reviewed.  Constitutional:      General: She is not in acute distress.    Appearance: Normal appearance. She is not  ill-appearing.  HENT:     Head: Normocephalic and atraumatic.     Comments: Shunt palpable behind right ear was easily compressible bulb and palpable tract around neck    Right Ear: External ear normal.     Left Ear: External ear normal.     Nose: Nose normal.     Mouth/Throat:     Mouth: Mucous membranes are moist.  Eyes:     Extraocular Movements: Extraocular movements intact.     Pupils: Pupils are equal, round, and reactive to light.  Neck:     Musculoskeletal: Normal range of motion and neck supple.  Cardiovascular:     Rate and Rhythm: Normal rate and regular rhythm.     Pulses: Normal pulses.     Heart sounds: Normal heart sounds.  Pulmonary:     Effort: Pulmonary effort is normal.     Breath sounds: Normal breath sounds.  Abdominal:     General: Abdomen is flat.     Palpations: Abdomen is soft.  Musculoskeletal: Normal range of motion.  Skin:    General: Skin is warm and dry.  Neurological:     General: No focal deficit present.     Mental Status: She is alert and oriented to person, place, and time.     Cranial Nerves: No cranial nerve deficit.     Motor: No weakness.     Coordination: Coordination abnormal.     Gait: Gait normal.     Comments: No visual field deficit noted  Psychiatric:        Mood and Affect: Mood normal.      ED Treatments / Results  Labs (all labs ordered are listed, but only abnormal results are displayed) Labs Reviewed  CBC  COMPREHENSIVE METABOLIC PANEL  URINALYSIS, ROUTINE W REFLEX MICROSCOPIC    EKG EKG Interpretation  Date/Time:  Sunday September 26 2018 13:49:31 EDT Ventricular Rate:  70 PR Interval:    QRS Duration: 87 QT Interval:  407 QTC Calculation: 440 R Axis:   59 Text Interpretation:  Normal sinus rhythm Normal ECG Confirmed by Pattricia Boss [id number]) on 09/26/2018 2:58:58 PM   Radiology No results found.  Procedures Procedures (including critical care time)  Medications Ordered in ED Medications - No  data to  display   Initial Impression / Assessment and Plan / ED Course  I have reviewed the triage vital signs and the nursing notes.  Pertinent labs & imaging results that were available during my care of the patient were reviewed by me and considered in my medical decision making (see chart for details).        Female with recent urinary tract infection, story of normal pressure hydrocephalus status post shunt presents today with some lightheadedness.  Labs, shunt series, head CT being obtained.  If all stable, patient candidate for discharge home. Discussed with Dr. Darl Householder and he will see and reevaluate after studies return   Final Clinical Impressions(s) / ED Diagnoses   Final diagnoses:  Lightheaded    ED Discharge Orders    None       Pattricia Boss, MD 09/26/18 1500

## 2018-09-26 NOTE — Discharge Instructions (Signed)
Continue your current meds   Call your neurologist or neurosurgeon tomorrow regarding follow up and adjustment to your shunt   Your shunt is in good position right now based on the imaging studies in the ED   Return to ER if you have worse dizziness, passing out, headaches, chest pain

## 2018-09-26 NOTE — ED Provider Notes (Signed)
  Physical Exam  BP (!) 129/99 (BP Location: Left Arm)   Pulse 64   Temp 99 F (37.2 C) (Rectal)   Resp 17   Ht 5\' 3"  (1.6 m)   Wt 57.6 kg   SpO2 98%   BMI 22.50 kg/m   Physical Exam  ED Course/Procedures     Procedures  MDM  Care assumed at 3 pm from Dr. Jeanell Sparrow.  Patient does have VP shunt and has been having dizziness and lightheadedness.  Felt like she is going to pass out but did not actually pass out.  Patient is taking antibiotics already for UTI has no urinary symptoms. Sign out pending CT head and shunt series   5:01 PM CT head and shunt series showed that shunt is functioning appropriately. Told her to call her neurosurgeon at [facility] tomorrow for follow up. Labs and UA unremarkable       Drenda Freeze, MD 09/26/18 1701

## 2018-09-26 NOTE — ED Triage Notes (Signed)
Pt states she woke up this morning with dizziness. Since she had a stunt placed in her brain 2 years ago this is normal. However this monring her dizziness did not subside. Pt endorses only being dizzy upon movement.  Denies SOB Denies vision changes

## 2018-09-26 NOTE — ED Notes (Signed)
Patient transported to CT 

## 2018-09-29 ENCOUNTER — Telehealth: Payer: Self-pay | Admitting: Neurology

## 2018-09-29 NOTE — Telephone Encounter (Signed)
I reached out to the pt and stated I received her message. I advised we could see her on 10/04/18 at 2:30 check in time of 2 pm and pt was agreeable.  Pt advised of check in process and covid 19 precautions.

## 2018-09-29 NOTE — Telephone Encounter (Signed)
Pt called in requesting a sooner appt before 10/21/2018 , please call

## 2018-10-04 ENCOUNTER — Ambulatory Visit: Payer: Medicare Other | Admitting: Neurology

## 2018-10-04 ENCOUNTER — Encounter: Payer: Self-pay | Admitting: Neurology

## 2018-10-04 ENCOUNTER — Other Ambulatory Visit: Payer: Self-pay

## 2018-10-04 VITALS — BP 122/80 | HR 87 | Temp 98.4°F | Ht 63.0 in | Wt 131.3 lb

## 2018-10-04 DIAGNOSIS — G912 (Idiopathic) normal pressure hydrocephalus: Secondary | ICD-10-CM

## 2018-10-04 DIAGNOSIS — R2681 Unsteadiness on feet: Secondary | ICD-10-CM | POA: Diagnosis not present

## 2018-10-04 MED ORDER — DIAZEPAM 2 MG PO TABS: 2.0000 mg | ORAL_TABLET | Freq: Two times a day (BID) | ORAL | 0 refills | Status: DC

## 2018-10-04 NOTE — Progress Notes (Signed)
Reason for visit: Normal pressure hydrocephalus  Kylie Calhoun is an 68 y.o. female  History of present illness:  Kylie Calhoun is a 68 year old right-handed white female with a history of normal pressure hydrocephalus, status post VP shunt placement.  The patient has had chronic problems with a lightheaded woozy sensation that has worsened recently, she was in the emergency room on 26 September 2018 with a significant increase in her dizziness.  The patient has tried to increase her intake of fluid, this seems to help some, but she still feels more imbalanced.  She did have a fall 2 weeks ago when she fell backwards after stooping forward.  The patient did not sustain significant injury.  She has had a recent CT scan of the brain done on 26 September 2018 that showed good stability.  She has not been to see Dr. Johnney Killian from neurosurgery recently.  The patient is able to operate a motor vehicle.  She walks without a cane.  She continues to have some urinary incontinence but has had bladder cancer previously and this has been a chronic feature for her.  She reports no other new numbness or weakness of the face, arms, legs.  Past Medical History:  Diagnosis Date  . 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 Ankle fracture, left   . Bladder cancer ([place]) 2011   3 times  . Diverticulosis    mild per colonoscopy  . Dyslipidemia   . Hemorrhoid    internal per colonoscopy 2016  . Hx of osteopenia   . NPH (normal pressure hydrocephalus) ([place]) 09/18/2016  . Stroke [facility])     Past Surgical History:  Procedure Laterality Date  . ABDOMINAL HYSTERECTOMY    . APPENDECTOMY    . BLADDER SURGERY     scope and removal of tumors  . BRAIN SURGERY    . 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 REFRACTIVE SURGERY    . TONSILLECTOMY      Family History  Problem Relation Age of Onset  . Heart disease Father   . Stroke Sister   . Hypertension Sister   . Stroke Brother   .  Hypertension Brother     Social history:  reports that she has never smoked. She has never used smokeless tobacco. She reports current alcohol use. She reports that she does not use drugs.   No Known Allergies  Medications:  Prior to Admission medications   Medication Sig Start Date End Date Taking? Authorizing Provider  atorvastatin (LIPITOR) 20 MG tablet Take 20 mg by mouth daily at 6 PM.  03/21/15  Yes [provider]  Cholecalciferol (VITAMIN D-3) 25 MCG (1000 UT) CAPS Take 2,000-3,000 Units by mouth daily with breakfast.   Yes [provider]  clopidogrel (PLAVIX) 75 MG tablet TAKE 1 TABLET(75 MG) BY MOUTH DAILY Patient taking differently: Take 75 mg by mouth daily.  04/26/18  Yes Kathrynn Ducking, MD  Fluocinolone Acetonide 0.01 % OIL Place 5 drops into both ears as needed (as directed for itching).  12/08/17  Yes [provider]  Melatonin 10 MG TABS Take 10 mg by mouth at bedtime as needed (for sleep). typically cuts is half   Yes [provider]  Multiple Vitamin (MULTIVITAMIN WITH MINERALS) TABS tablet Take 1 tablet by mouth daily.   Yes [provider]    ROS:  Out of a complete 14 system review of symptoms, the  patient complains only of the following symptoms, and all other reviewed systems are negative.  Dizziness Gait instability Urinary incontinence  Blood pressure 122/80, pulse 87, temperature 98.4 F (36.9 C), temperature source Oral, height 5\' 3"  (1.6 m), weight 131 lb 5 oz (59.6 kg), SpO2 98 %.  Physical Exam  General: The patient is alert and cooperative at the time of the examination.  Skin: No significant peripheral edema is noted.   Neurologic Exam  Mental status: The patient is alert and oriented x 3 at the time of the examination. The patient has apparent normal recent and remote memory, with an apparently normal attention span and concentration ability.   Cranial nerves: Facial symmetry is present. Speech  is normal, no aphasia or dysarthria is noted. Extraocular movements are full. Visual fields are full.  Motor: The patient has good strength in all 4 extremities.  Sensory examination: Soft touch sensation is symmetric on the face, arms, and legs.  Coordination: The patient has good finger-nose-finger and heel-to-shin bilaterally.  Gait and station: The patient has a wide-based gait, she can walk independently.  She has some hesitancy with turns.  Tandem gait is minimally unsteady.  Romberg is negative.  Reflexes: Deep tendon reflexes are symmetric.   CT brain 09/26/18:  IMPRESSION: 1. No acute intracranial abnormality or significant interval change. 2. Stable right parietal ventriculostomy shunt. 3. Atherosclerosis.  * CT scan images were reviewed online. I agree with the written report.    Assessment/Plan:  1.  Normal pressure hydrocephalus  2.  Chronic dizziness with recent exacerbation  3.  Gait instability  The patient claims that medication such as meclizine in the past worsened her dizziness.  Will give a trial on low-dose diazepam.  At some point in future having another visit with Dr. Johnney Killian may be reasonable.  It does not appear that her ability to ambulate has changed much since her last evaluation 1 year ago.  We will follow-up in 6 months.  Jill Alexanders MD 10/04/2018 2:47 PM  [facility] [address] [place], [place] [address]  Phone [phone] Fax [phone]

## 2018-10-04 NOTE — Patient Instructions (Signed)
We will try low dose diazepam 2 mg twice a day for the dizziness, if no help in 2 or three weeks, stop the medication.

## 2018-10-21 ENCOUNTER — Other Ambulatory Visit: Payer: Self-pay | Admitting: Neurology

## 2018-10-21 ENCOUNTER — Ambulatory Visit: Payer: Medicare Other | Admitting: Neurology

## 2018-10-21 DIAGNOSIS — I639 Cerebral infarction, unspecified: Secondary | ICD-10-CM

## 2019-02-07 ENCOUNTER — Telehealth: Payer: Self-pay | Admitting: Neurology

## 2019-02-07 NOTE — Telephone Encounter (Signed)
I contacted the pt and she reports she should be having cataract removal within the next month. Pt reports [facility] asked her to call and confirm when she should d/c plavix once procedure is scheduled and when she should resume medication?

## 2019-02-07 NOTE — Telephone Encounter (Signed)
Pt called stating that she is having cataract surgery and is wanting to know the steps of getting of her blood thinner. Please advise.

## 2019-02-07 NOTE — Telephone Encounter (Signed)
The patient may stop the Plavix 7 days before the procedure, she will need to contact the ophthalmologist to determine when she can restart it after the procedure has been done.

## 2019-02-10 ENCOUNTER — Encounter: Payer: Self-pay | Admitting: Rehabilitation

## 2019-02-10 ENCOUNTER — Other Ambulatory Visit: Payer: Self-pay

## 2019-02-10 ENCOUNTER — Ambulatory Visit: Payer: Medicare Other | Attending: Internal Medicine | Admitting: Rehabilitation

## 2019-02-10 DIAGNOSIS — M6281 Muscle weakness (generalized): Secondary | ICD-10-CM | POA: Diagnosis present

## 2019-02-10 DIAGNOSIS — R2689 Other abnormalities of gait and mobility: Secondary | ICD-10-CM | POA: Insufficient documentation

## 2019-02-10 DIAGNOSIS — R2681 Unsteadiness on feet: Secondary | ICD-10-CM | POA: Insufficient documentation

## 2019-02-10 DIAGNOSIS — R42 Dizziness and giddiness: Secondary | ICD-10-CM | POA: Insufficient documentation

## 2019-02-10 NOTE — Therapy (Signed)
[facility] [address] [place], [place], [address] Phone: [phone]   Fax:  [phone]  Physical Therapy Evaluation  Patient Details  Name: Kylie Calhoun MRN: [id number] Date of Birth: 14-Apr-1951 Referring Provider (PT): Marton Redwood, MD   Encounter Date: 02/10/2019  PT End of Session - 02/10/19 1212    Visit Number  1    Number of Visits  13    Date for PT Re-Evaluation  05/11/19   POC written for 90 days as she plans to have cataracts removed in Nov.   [facility] [employer] 10th visit progress note    PT Start Time  0932    PT Stop Time  1015    PT Time Calculation (min)  43 min    Activity Tolerance  Patient tolerated treatment well    Behavior During Therapy  [facility] for tasks assessed/performed       Past Medical History:  Diagnosis Date  . 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 Ankle fracture, left   . Bladder cancer ([place]) 2011   3 times  . Diverticulosis    mild per colonoscopy  . Dyslipidemia   . Hemorrhoid    internal per colonoscopy 2016  . Hx of osteopenia   . NPH (normal pressure hydrocephalus) ([place]) 09/18/2016  . Stroke [facility])     Past Surgical History:  Procedure Laterality Date  . ABDOMINAL HYSTERECTOMY    . APPENDECTOMY    . BLADDER SURGERY     scope and removal of tumors  . BRAIN SURGERY    . 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 REFRACTIVE SURGERY    . TONSILLECTOMY      There were no vitals filed for this visit.   Subjective Assessment - 02/10/19 0936    Subjective  "I've got a brain shunt that was put in two years ago.  The first year I did great, but I am developing more motion and balance issues.  [facility] recommended that I get physical therapy."    Pertinent History  normal pressure hydrocephalus s/p VP shunt at [employer] (2 years ago), hx of bladder cancer, CVA, osteopenia    Limitations  Walking    How long can you walk  comfortably?  2 miles per day    Patient Stated Goals  "I would like to become more sure of my footing.  I would like to increase my energy."    Currently in Pain?  No/denies         [facility] PT Assessment - 02/10/19 0940      Assessment   Medical Diagnosis  Gait abnormality    Referring Provider (PT)  Marton Redwood, MD    Onset Date/Surgical Date  --   Noticed motion and balance issue more in the last year   Prior Therapy  Had OP PT approx 4 years ago post CVA      Precautions   Precautions  Fall      Balance Screen   Has the patient fallen in the past 6 months  No    Has the patient had a decrease in activity level because of a fear of falling?   Yes    Is the patient reluctant to leave their home because of a fear of falling?   Yes      [place]  Alone    Available Help at Discharge  Family;Available PRN/intermittently   daughter lives a mile away    Type of [place] to enter    Entrance Stairs-Number of Steps  1 step     Home Layout  Two level;Bed/bath upstairs    Alternate Level Stairs-Number of Steps  12    Alternate Level Stairs-Rails  Left    Home Equipment  [place] - single point      Prior Function   Level of Independence  Independent    Vocation  Retired    Leisure  walking, read, work out (in the past)       Cognition   Overall Cognitive Status  Within Functional Limits for tasks assessed      Sensation   Light Touch  Appears Intact    Additional Comments  Reports heavy feeling in feet       Coordination   Gross Motor Movements are Fluid and Coordinated  Yes    Fine Motor Movements are Fluid and Coordinated  No    Heel Shin Test  LLE slightly dysmetric       Posture/Postural Control   Posture/Postural Control  Postural limitations      ROM / Strength   AROM / PROM / Strength  Strength      Strength   Overall Strength  Deficits    Overall Strength Comments   L hip flex 3+/5, all others grossly 4/5, except for R ankle DF 4/5, L ankle DF 3+/5       Transfers   Transfers  Sit to Stand;Stand to Sit    Sit to Stand  6: Modified independent (Device/Increase time)    Five time sit to stand comments   13.44 secs without UE support     Stand to Sit  6: Modified independent (Device/Increase time)      Ambulation/Gait   Ambulation/Gait  Yes    Ambulation/Gait Assistance  6: Modified independent (Device/Increase time);4: Min guard    Ambulation Distance (Feet)  500 Feet   min/guard during balance challenges    Assistive device  None    Gait Pattern  Step-through pattern;Decreased arm swing - left;Decreased stride length;Decreased weight shift to left;Trunk flexed    Ambulation Surface  Level;Indoor    Gait velocity  2.98 ft/sec without device       6 Minute Walk- Baseline   6 Minute Walk- Baseline  yes    BP (mmHg)  113/67    HR (bpm)  68    02 Sat (%RA)  98 %    Modified Borg Scale for Dyspnea  0- Nothing at all    Perceived Rate of Exertion (Borg)  6-      High Level Balance   High Level Balance Comments  Note pt with increased dizziness during gait with horizontal head turns likely due to central source (hydrocephalus) and hypofunction, therefore provided and performed gait at counter top for UE support making head turns every 2-3 steps. Pt tolerated well.  Provided education on get starting number for dizziness 0-10 and to increase by only 3-4 points       Functional Gait  Assessment   Gait assessed   Yes    Gait Level Surface  Walks 20 ft in less than 7 sec but greater than 5.5 sec, uses assistive device, slower speed, mild gait deviations, or deviates 6-10 in outside of the 12 in walkway width.  6.37 secs    Change in Gait Speed  Able to change speed, demonstrates mild gait deviations, deviates 6-10 in outside of the 12 in walkway width, or no gait deviations, unable to achieve a major change in velocity, or uses a change in velocity, or  uses an assistive device.    Gait with Horizontal Head Turns  Performs head turns smoothly with slight change in gait velocity (eg, minor disruption to smooth gait path), deviates 6-10 in outside 12 in walkway width, or uses an assistive device.    Gait with Vertical Head Turns  Performs task with slight change in gait velocity (eg, minor disruption to smooth gait path), deviates 6 - 10 in outside 12 in walkway width or uses assistive device    Gait and Pivot Turn  Pivot turns safely in greater than 3 sec and stops with no loss of balance, or pivot turns safely within 3 sec and stops with mild imbalance, requires small steps to catch balance.    Step Over Obstacle  Is able to step over one shoe box (4.5 in total height) without changing gait speed. No evidence of imbalance.    Gait with Narrow Base of Support  Ambulates less than 4 steps heel to toe or cannot perform without assistance.    Gait with Eyes Closed  Walks 20 ft, slow speed, abnormal gait pattern, evidence for imbalance, deviates 10-15 in outside 12 in walkway width. Requires more than 9 sec to ambulate 20 ft.    Ambulating Backwards  Walks 20 ft, uses assistive device, slower speed, mild gait deviations, deviates 6-10 in outside 12 in walkway width.    Steps  Alternating feet, must use rail.    Total Score  17    FGA comment:  < 19 = high risk fall                Objective measurements completed on examination: See above findings.              PT Education - 02/10/19 1212    Education Details  verbally provided head turns while walking along counter top for habituation, education on evaluation results, goals and POC    Person(s) Educated  Patient    Methods  Explanation;Demonstration    Comprehension  Verbalized understanding;Returned demonstration       PT Short Term Goals - 02/10/19 1230      PT SHORT TERM GOAL #1   Title  Pt will be independent with initial HEP in order to indicate decreased fall risk  and improved functional mobility.  (Target Date: 02/24/19 or 5th visit)    Time  2    Period  Weeks    Status  New    Target Date  02/24/19      PT SHORT TERM GOAL #2   Title  Pt will improve FGA to 19/30 in order to indicate decreased fall risk.    Status  New      PT SHORT TERM GOAL #3   Title  Pt will perform 6MWT and appropriate LTG to be set in order to indicate improved functional endurance.        PT Long Term Goals - 02/10/19 1233      PT LONG TERM GOAL #1   Title  Pt will be independent with final HEP in order to indicate decreased fall risk and improved functional mobility.  (Target Date: 03/27/19 or 13th visit)    Time  6  Period  Weeks    Status  New    Target Date  03/27/19      PT LONG TERM GOAL #2   Title  Pt will improve FGA to >/=23/30 in order to indicate decreased fall risk.      PT LONG TERM GOAL #3   Title  Pt will improve gait speed to >/=3.6 ft/sec in order to indicate improved efficiency of gait.      PT LONG TERM GOAL #4   Title  Pt will ambulate x 1000' over indoor and outdoor paved surfaces while scanning environment without overt LOB in order to indicate safe community negotiation and return to leisure fitness.      PT LONG TERM GOAL #5   Title  Pt will improve 6MWT 150' from baseline in order to indicate improved functional endurance.             Plan - 02/10/19 1213    Clinical Impression Statement  Pt presents with normal pressure hydrocephalus s/p VP shunt placement 2 years ago with decreasing balance and dizziness noted in the last year.  Note history of bladder cancer, CVA, and osteopenia.   Upon PT evaluation, note generalized weakness (LLE>RLE from old CVA), decreased balance, and decreased endurance.  Gait speed is 2.98 ft/sec which is below normal for her age, 5TSS time of 13.44 secs without UE support, indicative of elevated fall risk and decreased functional strength, and FGA score of 17/30 indicative of high fall risk.  Pt will  benefit from skilled OP neuro PT in order to address deficits.    Personal Factors and Comorbidities  Age;Comorbidity 3+    Comorbidities  see above    Examination-Activity Limitations  Locomotion Level;Lift;Bend;Carry;Transfers;Stairs;Stand    Examination-Participation Restrictions  Community Activity;Other   return to physical exercise   Stability/Clinical Decision Making  Evolving/Moderate complexity    Clinical Decision Making  Moderate    Rehab Potential  Good    PT Frequency  2x / week    PT Duration  6 weeks    PT Treatment/Interventions  ADLs/Self Care Home Management;Aquatic Therapy;Gait training;Stair training;Functional mobility training;Therapeutic activities;Therapeutic exercise;Balance training;Neuromuscular re-education;Patient/family education;Vestibular    PT Next Visit Plan  6MWT, vestibular assessment (just give habituation, gaze stabilization HEP), endurance, high level balance with head turns    Consulted and Agree with Plan of Care  Patient       Patient will benefit from skilled therapeutic intervention in order to improve the following deficits and impairments:  Abnormal gait, Decreased activity tolerance, Decreased balance, Decreased coordination, Decreased endurance, Decreased mobility, Decreased strength, Dizziness, Impaired flexibility, Improper body mechanics  Visit Diagnosis: Unsteadiness on feet  Other abnormalities of gait and mobility  Dizziness and giddiness  Muscle weakness (generalized)     Problem List Patient Active Problem List   Diagnosis Date Noted  . NPH (normal pressure hydrocephalus) ([place]) 09/18/2016  . Left hand paresthesia 09/18/2016  . Left arm numbness 09/18/2016  . 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 Stroke ([place]) 03/20/2015  . Stroke with cerebral ischemia ([place])   . HLD (hyperlipidemia)   . Unsteady gait 03/19/2015  . Hx of osteopenia   . Bladder cancer ([place])   . Hemorrhoid   . Diverticulosis   .  Ischemic stroke ([place])   . Malignant neoplasm of urinary bladder ([place])     Cameron Sprang, PT, MPT [facility] [address] [place], [place], [address] Phone: [phone]  Fax:  [phone] 02/10/19, 12:44 PM  Name: Kylie Calhoun MRN: [id number] Date of Birth: 11/18/1950

## 2019-02-14 ENCOUNTER — Other Ambulatory Visit: Payer: Self-pay

## 2019-02-14 ENCOUNTER — Encounter: Payer: Self-pay | Admitting: Rehabilitation

## 2019-02-14 ENCOUNTER — Ambulatory Visit: Payer: Medicare Other | Attending: Internal Medicine | Admitting: Rehabilitation

## 2019-02-14 DIAGNOSIS — M6281 Muscle weakness (generalized): Secondary | ICD-10-CM | POA: Diagnosis present

## 2019-02-14 DIAGNOSIS — R2689 Other abnormalities of gait and mobility: Secondary | ICD-10-CM

## 2019-02-14 DIAGNOSIS — R42 Dizziness and giddiness: Secondary | ICD-10-CM | POA: Diagnosis present

## 2019-02-14 DIAGNOSIS — R2681 Unsteadiness on feet: Secondary | ICD-10-CM

## 2019-02-14 NOTE — Therapy (Signed)
[facility] [address] [place], [place], [address] Phone: [phone]   Fax:  [phone]  Physical Therapy Treatment  Patient Details  Name: Kylie Calhoun MRN: [id number] Date of Birth: 1950-06-14 Referring Provider (PT): Marton Redwood, MD   Encounter Date: 02/14/2019  PT End of Session - 02/14/19 0940    Visit Number  2    Number of Visits  13    Date for PT Re-Evaluation  05/11/19   POC written for 90 days as she plans to have cataracts removed in Nov.   [facility] [employer] 10th visit progress note    PT Start Time  [phone]    PT Stop Time  0930    PT Time Calculation (min)  43 min    Activity Tolerance  Patient tolerated treatment well    Behavior During Therapy  [facility] for tasks assessed/performed       Past Medical History:  Diagnosis Date  . 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 Ankle fracture, left   . Bladder cancer ([place]) 2011   3 times  . Diverticulosis    mild per colonoscopy  . Dyslipidemia   . Hemorrhoid    internal per colonoscopy 2016  . Hx of osteopenia   . NPH (normal pressure hydrocephalus) ([place]) 09/18/2016  . Stroke [facility])     Past Surgical History:  Procedure Laterality Date  . ABDOMINAL HYSTERECTOMY    . APPENDECTOMY    . BLADDER SURGERY     scope and removal of tumors  . BRAIN SURGERY    . 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 REFRACTIVE SURGERY    . TONSILLECTOMY      There were no vitals filed for this visit.  Subjective Assessment - 02/14/19 0850    Subjective  Pt reports no issues since last visit.    Pertinent History  normal pressure hydrocephalus s/p VP shunt at [place] (2 years ago), hx of bladder cancer, CVA, osteopenia    Limitations  Walking    How long can you walk comfortably?  2 miles per day    Patient Stated Goals  "I would like to become more sure of my footing.  I would like to increase my energy."    Currently in Pain?  No/denies         [facility] PT Assessment - 02/14/19 0852      6 Minute Walk- Baseline   6 Minute Walk- Baseline  yes    BP (mmHg)  142/80    HR (bpm)  77    02 Sat (%RA)  98 %    Modified Borg Scale for Dyspnea  0- Nothing at all    Perceived Rate of Exertion (Borg)  6-      6 Minute walk- Post Test   6 Minute Walk Post Test  yes    BP (mmHg)  137/75    HR (bpm)  77    02 Sat (%RA)  97 %    Modified Borg Scale for Dyspnea  3- Moderate shortness of breath or breathing difficulty    Perceived Rate of Exertion (Borg)  11- Fairly light      6 minute walk test results    Aerobic Endurance Distance Walked  1159    Endurance additional comments  no AD, no rest breaks needed.              Access  Code: [email or web address]  URL: [email or web address]  Date: 02/14/2019  Prepared by: Cameron Sprang   Exercises Seated Gaze Stabilization with Head Rotation - 3 sets - 20 hold - 2x daily - 7x weekly Romberg Stance with Head Rotation - 2 sets - 20 hold - 2x daily - 7x weekly Romberg Stance with Eyes Closed - 3 sets - 20 hold - 2x daily - 7x weekly Tandem Stance - 2 sets - 20 hold - 2x daily - 7x weekly Romberg Stance on Foam Pad with Head Rotation - 2 sets - 20 hold - 2x daily - 7x weekly (should be feet apart)               PT Education - 02/14/19 0940    Education Details  Provided formal HEP for vestibular habituation exercises along with high level balance exercises.    Person(s) Educated  Patient    Methods  Explanation;Demonstration;Handout    Comprehension  Verbalized understanding;Returned demonstration       PT Short Term Goals - 02/14/19 0942      PT SHORT TERM GOAL #1   Title  Pt will be independent with initial HEP in order to indicate decreased fall risk and improved functional mobility.  (Target Date: 02/24/19 or 5th visit)    Time  2    Period  Weeks    Status  New    Target Date  02/24/19      PT SHORT TERM GOAL #2   Title   Pt will improve FGA to 19/30 in order to indicate decreased fall risk.    Status  New      PT SHORT TERM GOAL #3   Title  Pt will perform 6MWT and appropriate LTG to be set in order to indicate improved functional endurance.    Baseline  1149' on 02/14/19    Status  Achieved        PT Long Term Goals - 02/10/19 1233      PT LONG TERM GOAL #1   Title  Pt will be independent with final HEP in order to indicate decreased fall risk and improved functional mobility.  (Target Date: 03/27/19 or 13th visit)    Time  6    Period  Weeks    Status  New    Target Date  03/27/19      PT LONG TERM GOAL #2   Title  Pt will improve FGA to >/=23/30 in order to indicate decreased fall risk.      PT LONG TERM GOAL #3   Title  Pt will improve gait speed to >/=3.6 ft/sec in order to indicate improved efficiency of gait.      PT LONG TERM GOAL #4   Title  Pt will ambulate x 1000' over indoor and outdoor paved surfaces while scanning environment without overt LOB in order to indicate safe community negotiation and return to leisure fitness.      PT LONG TERM GOAL #5   Title  Pt will improve 6MWT 150' from baseline in order to indicate improved functional endurance.            Plan - 02/14/19 0941    Clinical Impression Statement  Skilled session focused on assessment of functional endurance with 6MWT.  Note distance of 12' which is below normal for her age group with moderate fatigue noted.  Will continue to address.  Also provided formal HEP for vestibular habituation and high level balance.  Pt tolerated well.  Personal Factors and Comorbidities  Age;Comorbidity 3+    Comorbidities  see above    Examination-Activity Limitations  Locomotion Level;Lift;Bend;Carry;Transfers;Stairs;Stand    Examination-Participation Restrictions  Community Activity;Other   return to physical exercise   Stability/Clinical Decision Making  Evolving/Moderate complexity    Rehab Potential  Good    PT Frequency   2x / week    PT Duration  6 weeks    PT Treatment/Interventions  ADLs/Self Care Home Management;Aquatic Therapy;Gait training;Stair training;Functional mobility training;Therapeutic activities;Therapeutic exercise;Balance training;Neuromuscular re-education;Patient/family education;Vestibular    PT Next Visit Plan  Follow up on gaze stabilization exercise (do we need to update) endurance (see how she does with increased gait speed/safety), high level balance with head turns    Consulted and Agree with Plan of Care  Patient       Patient will benefit from skilled therapeutic intervention in order to improve the following deficits and impairments:  Abnormal gait, Decreased activity tolerance, Decreased balance, Decreased coordination, Decreased endurance, Decreased mobility, Decreased strength, Dizziness, Impaired flexibility, Improper body mechanics  Visit Diagnosis: Unsteadiness on feet  Other abnormalities of gait and mobility  Dizziness and giddiness  Muscle weakness (generalized)     Problem List Patient Active Problem List   Diagnosis Date Noted  . NPH (normal pressure hydrocephalus) ([place]) 09/18/2016  . Left hand paresthesia 09/18/2016  . Left arm numbness 09/18/2016  . 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 Stroke ([place]) 03/20/2015  . Stroke with cerebral ischemia ([place])   . HLD (hyperlipidemia)   . Unsteady gait 03/19/2015  . Hx of osteopenia   . Bladder cancer ([place])   . Hemorrhoid   . Diverticulosis   . Ischemic stroke ([place])   . Malignant neoplasm of urinary bladder ([place])     Cameron Sprang, PT, MPT [facility] [address] [place], [place], [address] Phone: [phone]   Fax:  [phone] 02/14/19, 9:45 AM  Name: Kylie Calhoun MRN: [id number] Date of Birth: September 14, 1950

## 2019-02-14 NOTE — Patient Instructions (Signed)
Access Code: [email or web address]  URL: [email or web address]  Date: 02/14/2019  Prepared by: Cameron Sprang   Exercises Seated Gaze Stabilization with Head Rotation - 3 sets - 20 hold - 2x daily - 7x weekly Romberg Stance with Head Rotation - 2 sets - 20 hold - 2x daily - 7x weekly Romberg Stance with Eyes Closed - 3 sets - 20 hold - 2x daily - 7x weekly Tandem Stance - 2 sets - 20 hold - 2x daily - 7x weekly Romberg Stance on Foam Pad with Head Rotation - 2 sets - 20 hold - 2x daily - 7x weekly

## 2019-02-16 ENCOUNTER — Other Ambulatory Visit: Payer: Self-pay

## 2019-02-16 ENCOUNTER — Ambulatory Visit: Payer: Medicare Other | Admitting: Physical Therapy

## 2019-02-16 DIAGNOSIS — R2681 Unsteadiness on feet: Secondary | ICD-10-CM

## 2019-02-16 DIAGNOSIS — R2689 Other abnormalities of gait and mobility: Secondary | ICD-10-CM

## 2019-02-16 DIAGNOSIS — M6281 Muscle weakness (generalized): Secondary | ICD-10-CM

## 2019-02-16 DIAGNOSIS — R42 Dizziness and giddiness: Secondary | ICD-10-CM

## 2019-02-16 NOTE — Patient Instructions (Signed)
Access Code: [facility]  URL: [email or web address]  Date: 02/16/2019  Prepared by: Elsie Ra   Exercises  Seated Gaze Stabilization with Head Nod - 2 sets - 30 sec hold - 2x daily - 6x weekly  Seated Proximal-Distal Smooth Pursuit - 10 reps - 1-2 sets - 2x daily - 6x weekly  Seated Gaze Stabilization with Head Rotation and Horizontal Arm Movement - 1-2 reps - 1 sets - 30 hold - 2x daily - 6x weekly  Seated Gaze Stabilization with Head Nod and Vertical Arm Movement - 1-2 reps - 1 sets - 30 hold - 2x daily - 6x weekly

## 2019-02-16 NOTE — Therapy (Signed)
[facility] [address] [place], [place], [address] Phone: [phone]   Fax:  [phone]  Physical Therapy Treatment  Patient Details  Name: Kylie Calhoun MRN: [id number] Date of Birth: 1950/04/20 Referring Provider (PT): Marton Redwood, MD   Encounter Date: 02/16/2019  PT End of Session - 02/16/19 1018    Visit Number  3    Number of Visits  13    Date for PT Re-Evaluation  05/11/19   POC written for 90 days as she plans to have cataracts removed in Nov.   [facility] 10th visit progress note    PT Start Time  0930    PT Stop Time  1010    PT Time Calculation (min)  40 min    Activity Tolerance  Patient tolerated treatment well    Behavior During Therapy  [facility] for tasks assessed/performed       Past Medical History:  Diagnosis Date  . 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 Ankle fracture, left   . Bladder cancer ([place]) 2011   3 times  . Diverticulosis    mild per colonoscopy  . Dyslipidemia   . Hemorrhoid    internal per colonoscopy 2016  . Hx of osteopenia   . NPH (normal pressure hydrocephalus) ([place]) 09/18/2016  . Stroke [facility])     Past Surgical History:  Procedure Laterality Date  . ABDOMINAL HYSTERECTOMY    . APPENDECTOMY    . BLADDER SURGERY     scope and removal of tumors  . BRAIN SURGERY    . 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 REFRACTIVE SURGERY    . TONSILLECTOMY      There were no vitals filed for this visit.  Subjective Assessment - 02/16/19 1016    Subjective  Reports compliance with HEP, feels she is improving    Currently in Pain?  No/denies                       [facility] Adult PT Treatment/Exercise - 02/16/19 0001      Neuro Re-ed    Neuro Re-ed Details   Gaze stabilization L1 VOR horizontal and vertical 30 sec X 2 ea, then smooth pursuits anterior-posterior X 10, then VOR L2 horizontal and vertical  30 sec X 1 ea. Then balance training: standing feet apart on floor pillow with head turns X 10 horizontal and X 10 veritical, then feet together eyes closed 30 sec X 3, then marches X 10 bilat, then tandem balance 30 sec X 2 each foot in front all on floor pillow without UE support.  Then progressed to dynamic balance at counter top intermit UE support PRN up/down X 3 reps for tandem walk, retro walk, walk with eyes closed. Needs close supervision with all balance activities             PT Education - 02/16/19 1016    Education Details  progressed HEP    Person(s) Educated  Patient    Methods  Explanation;Demonstration;Verbal cues;Handout    Comprehension  Verbalized understanding;Returned demonstration       PT Short Term Goals - 02/14/19 0942      PT SHORT TERM GOAL #1   Title  Pt will be independent with initial HEP in order to indicate decreased fall risk and improved functional mobility.  (Target Date: 02/24/19 or 5th visit)    Time  2    Period  Weeks    Status  New    Target Date  02/24/19      PT SHORT TERM GOAL #2   Title  Pt will improve FGA to 19/30 in order to indicate decreased fall risk.    Status  New      PT SHORT TERM GOAL #3   Title  Pt will perform 6MWT and appropriate LTG to be set in order to indicate improved functional endurance.    Baseline  1149' on 02/14/19    Status  Achieved        PT Long Term Goals - 02/10/19 1233      PT LONG TERM GOAL #1   Title  Pt will be independent with final HEP in order to indicate decreased fall risk and improved functional mobility.  (Target Date: 03/27/19 or 13th visit)    Time  6    Period  Weeks    Status  New    Target Date  03/27/19      PT LONG TERM GOAL #2   Title  Pt will improve FGA to >/=23/30 in order to indicate decreased fall risk.      PT LONG TERM GOAL #3   Title  Pt will improve gait speed to >/=3.6 ft/sec in order to indicate improved efficiency of gait.      PT LONG TERM GOAL #4   Title   Pt will ambulate x 1000' over indoor and outdoor paved surfaces while scanning environment without overt LOB in order to indicate safe community negotiation and return to leisure fitness.      PT LONG TERM GOAL #5   Title  Pt will improve 6MWT 150' from baseline in order to indicate improved functional endurance.            Plan - 02/16/19 1018    Clinical Impression Statement  Progresssed her gaze stabilization exercises with fair to good tolerance. She recieved print out of new exercises today and showed good understanding. She was also able to progress dynamic balance today but needs close supervsion with this. Continue POC    Personal Factors and Comorbidities  Age;Comorbidity 3+    Comorbidities  see above    Examination-Activity Limitations  Locomotion Level;Lift;Bend;Carry;Transfers;Stairs;Stand    Examination-Participation Restrictions  Community Activity;Other   return to physical exercise   Stability/Clinical Decision Making  Evolving/Moderate complexity    Rehab Potential  Good    PT Frequency  2x / week    PT Duration  6 weeks    PT Treatment/Interventions  ADLs/Self Care Home Management;Aquatic Therapy;Gait training;Stair training;Functional mobility training;Therapeutic activities;Therapeutic exercise;Balance training;Neuromuscular re-education;Patient/family education;Vestibular    PT Next Visit Plan  Follow up on gaze stabilization exercise (do we need to update) endurance (see how she does with increased gait speed/safety), high level balance with head turns    Consulted and Agree with Plan of Care  Patient       Patient will benefit from skilled therapeutic intervention in order to improve the following deficits and impairments:  Abnormal gait, Decreased activity tolerance, Decreased balance, Decreased coordination, Decreased endurance, Decreased mobility, Decreased strength, Dizziness, Impaired flexibility, Improper body mechanics  Visit Diagnosis: Unsteadiness on  feet  Other abnormalities of gait and mobility  Dizziness and giddiness  Muscle weakness (generalized)     Problem List Patient Active Problem List   Diagnosis Date Noted  . NPH (normal pressure hydrocephalus) ([place]) 09/18/2016  . Left hand paresthesia 09/18/2016  .  Left arm numbness 09/18/2016  . 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 Stroke ([place]) 03/20/2015  . Stroke with cerebral ischemia ([place])   . HLD (hyperlipidemia)   . Unsteady gait 03/19/2015  . Hx of osteopenia   . Bladder cancer ([place])   . Hemorrhoid   . Diverticulosis   . Ischemic stroke ([place])   . Malignant neoplasm of urinary bladder [facility])     Silvestre Mesi 02/16/2019, 10:20 AM  [facility] [address] [place], [place], [address] Phone: [phone]   Fax:  [phone]  Name: Kylie Calhoun MRN: [id number] Date of Birth: 07-13-1950

## 2019-02-22 ENCOUNTER — Ambulatory Visit: Payer: Medicare Other | Admitting: Physical Therapy

## 2019-02-25 ENCOUNTER — Ambulatory Visit: Payer: Medicare Other | Admitting: Physical Therapy

## 2019-03-02 ENCOUNTER — Encounter: Payer: Self-pay | Admitting: Physical Therapy

## 2019-03-02 ENCOUNTER — Other Ambulatory Visit: Payer: Self-pay

## 2019-03-02 ENCOUNTER — Ambulatory Visit: Payer: Medicare Other | Admitting: Physical Therapy

## 2019-03-02 DIAGNOSIS — R2681 Unsteadiness on feet: Secondary | ICD-10-CM | POA: Diagnosis not present

## 2019-03-02 DIAGNOSIS — R42 Dizziness and giddiness: Secondary | ICD-10-CM

## 2019-03-02 DIAGNOSIS — R2689 Other abnormalities of gait and mobility: Secondary | ICD-10-CM

## 2019-03-02 DIAGNOSIS — M6281 Muscle weakness (generalized): Secondary | ICD-10-CM

## 2019-03-03 NOTE — Therapy (Signed)
[facility] [address] [place], [place], [address] Phone: [phone]   Fax:  [phone]  Physical Therapy Treatment  Patient Details  Name: Sulay Mitchell MRN: [id number] Date of Birth: 1951-02-14 Referring Provider (PT): Marton Redwood, MD   Encounter Date: 03/02/2019     03/02/19 1320  PT Visits / Re-Eval  Visit Number 4  Number of Visits 13  Date for PT Re-Evaluation 05/11/19 (POC written for 90 days as she plans to have cataracts removed in Nov.)  [facility] [employer] 10th visit progress note  PT Time Calculation  PT Start Time [id number]  PT Stop Time 1357  PT Time Calculation (min) 40 min  PT - End of Session  Activity Tolerance Patient tolerated treatment well  Behavior During Therapy [facility] for tasks assessed/performed    Past Medical History:  Diagnosis Date  . 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 Ankle fracture, left   . Bladder cancer ([place]) 2011   3 times  . Diverticulosis    mild per colonoscopy  . Dyslipidemia   . Hemorrhoid    internal per colonoscopy 2016  . Hx of osteopenia   . NPH (normal pressure hydrocephalus) ([place]) 09/18/2016  . Stroke [facility])     Past Surgical History:  Procedure Laterality Date  . ABDOMINAL HYSTERECTOMY    . APPENDECTOMY    . BLADDER SURGERY     scope and removal of tumors  . BRAIN SURGERY    . 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 REFRACTIVE SURGERY    . TONSILLECTOMY      There were no vitals filed for this visit.     03/02/19 1318  Symptoms/Limitations  Subjective Saw the eye doctor this morning for follow up for left eye Catract surgery, he reports the eye is doing great. Will have the right eye done on Dec 4th. No falls. HEP is going well, "I don't dread the pillow ones as much:  Pertinent History normal pressure hydrocephalus s/p VP shunt at [facility] (2 years ago), hx of bladder cancer, CVA,  osteopenia  Limitations Walking  How long can you walk comfortably? 2 miles per day  Patient Stated Goals "I would like to become more sure of my footing.  I would like to increase my energy."  Pain Assessment  Currently in Pain? No/denies      03/02/19 1334  Neuro Re-ed   Neuro Re-ed Details  reviewed and modified HEP. refer to [facility] for full details. cues needed for technique. no issues reported with performance in session.        03/02/19 1336  Vestibular Treatment/Exercise  Gaze Exercises X1 Viewing Horizontal;X1 Viewing Vertical  X1 Viewing Horizontal  Foot Position seated with busy background, progressing to standing feet apart solid background, then standing feet apart on 1 inch foam  Reps 10  Comments x1 rep seated with busy backgroud, 3 reps in all other positions. minimal to no dizzziness reported with these activities.   X1 Viewing Vertical  Foot Position seated with busy background, progressing to standing with solid background, progressing to standing on 1 inch foam with feet apart  Reps 10  Comments x1 rep seated, x 3 reps with each standing position. minimal to no dizziness reported with these activities        Balance Exercises - 03/02/19 1348      Balance Exercises: Standing   Standing Eyes Closed  Narrow base of support (BOS);Wide (BOA);Head turns;Foam/compliant surface;Other reps (comment);30 secs;Limitations   head mvmts 2 sets of 10 reps     Balance Exercises: Standing   Standing Eyes Closed Limitations  on 1 inch foam with no UE support, chair in front for safety: wide base of support progressing to narrow base of support for EC no head movements; then back in wide base of support for EC head movements left<>right, then up<>down for 2 sets of 10 reps. min guard to min assist for balance. cues on posture and weight shifting to assist with balance.         PT Education - 03/02/19 2140    Education Details  reviewed and re-worded HEP for improved  comprehension for at home    Person(s) Educated  Patient    Methods  Explanation;Demonstration;Verbal cues;Handout    Comprehension  Verbalized understanding;Returned demonstration;Need further instruction         PT Short Term Goals - 02/14/19 0942      PT SHORT TERM GOAL #1   Title  Pt will be independent with initial HEP in order to indicate decreased fall risk and improved functional mobility.  (Target Date: 02/24/19 or 5th visit)    Time  2    Period  Weeks    Status  New    Target Date  02/24/19      PT SHORT TERM GOAL #2   Title  Pt will improve FGA to 19/30 in order to indicate decreased fall risk.    Status  New      PT SHORT TERM GOAL #3   Title  Pt will perform 6MWT and appropriate LTG to be set in order to indicate improved functional endurance.    Baseline  1149' on 02/14/19    Status  Achieved        PT Long Term Goals - 02/10/19 1233      PT LONG TERM GOAL #1   Title  Pt will be independent with final HEP in order to indicate decreased fall risk and improved functional mobility.  (Target Date: 03/27/19 or 13th visit)    Time  6    Period  Weeks    Status  New    Target Date  03/27/19      PT LONG TERM GOAL #2   Title  Pt will improve FGA to >/=23/30 in order to indicate decreased fall risk.      PT LONG TERM GOAL #3   Title  Pt will improve gait speed to >/=3.6 ft/sec in order to indicate improved efficiency of gait.      PT LONG TERM GOAL #4   Title  Pt will ambulate x 1000' over indoor and outdoor paved surfaces while scanning environment without overt LOB in order to indicate safe community negotiation and return to leisure fitness.      PT LONG TERM GOAL #5   Title  Pt will improve 6MWT 150' from baseline in order to indicate improved functional endurance.          Plan - 03/02/19 2131    Clinical Impression Statement  Today's skilled session initially focused on review of updated HEP from last session as pt reported having issues at home. Pt  needed cues on correct technique with no issues in session afterwards. The remainder of the session continued to focus on advancing gaze stabilization ex's and on activities that require increased vestibular imput with minimal to no dizziness reported. The pt si progressing  toward goals and should benefit from continued PT to progress toward unmet goals.    Personal Factors and Comorbidities  Age;Comorbidity 3+    Comorbidities  see above    Examination-Activity Limitations  Locomotion Level;Lift;Bend;Carry;Transfers;Stairs;Stand    Examination-Participation Restrictions  Community Activity;Other   return to physical exercise   Stability/Clinical Decision Making  Evolving/Moderate complexity    Rehab Potential  Good    PT Frequency  2x / week    PT Duration  6 weeks    PT Treatment/Interventions  ADLs/Self Care Home Management;Aquatic Therapy;Gait training;Stair training;Functional mobility training;Therapeutic activities;Therapeutic exercise;Balance training;Neuromuscular re-education;Patient/family education;Vestibular    PT Next Visit Plan  endurance (see how she does with increased gait speed/safety), high level balance with head turns, dynamic gait, balance on compliant surfaces that require increased vestibular imput    Consulted and Agree with Plan of Care  Patient       Patient will benefit from skilled therapeutic intervention in order to improve the following deficits and impairments:  Abnormal gait, Decreased activity tolerance, Decreased balance, Decreased coordination, Decreased endurance, Decreased mobility, Decreased strength, Dizziness, Impaired flexibility, Improper body mechanics  Visit Diagnosis: Unsteadiness on feet  Other abnormalities of gait and mobility  Dizziness and giddiness  Muscle weakness (generalized)     Problem List Patient Active Problem List   Diagnosis Date Noted  . NPH (normal pressure hydrocephalus) ([place]) 09/18/2016  . Left hand paresthesia  09/18/2016  . Left arm numbness 09/18/2016  . 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 Stroke ([place]) 03/20/2015  . Stroke with cerebral ischemia ([place])   . HLD (hyperlipidemia)   . Unsteady gait 03/19/2015  . Hx of osteopenia   . Bladder cancer ([place])   . Hemorrhoid   . Diverticulosis   . Ischemic stroke ([place])   . Malignant neoplasm of urinary bladder ([place])     Willow Ora, PTA, [facility] [address] [place], [place] [address] [phone] 03/03/19, 9:36 PM   Name: Veeha Backes MRN: [id number] Date of Birth: [birth year]

## 2019-03-04 ENCOUNTER — Other Ambulatory Visit: Payer: Self-pay

## 2019-03-04 ENCOUNTER — Encounter: Payer: Self-pay | Admitting: Physical Therapy

## 2019-03-04 ENCOUNTER — Ambulatory Visit: Payer: Medicare Other | Admitting: Physical Therapy

## 2019-03-04 DIAGNOSIS — R2681 Unsteadiness on feet: Secondary | ICD-10-CM | POA: Diagnosis not present

## 2019-03-04 DIAGNOSIS — R42 Dizziness and giddiness: Secondary | ICD-10-CM

## 2019-03-04 DIAGNOSIS — M6281 Muscle weakness (generalized): Secondary | ICD-10-CM

## 2019-03-04 DIAGNOSIS — R2689 Other abnormalities of gait and mobility: Secondary | ICD-10-CM

## 2019-03-05 NOTE — Therapy (Signed)
[facility] [address] [place], [place], [address] Phone: [phone]   Fax:  [phone]  Physical Therapy Treatment  Patient Details  Name: Kylie Calhoun MRN: [id number] Date of Birth: [birth year] Referring Provider (PT): Marton Redwood, MD   Encounter Date: 03/04/2019     03/04/19 1020  PT Visits / Re-Eval  Visit Number 5  Number of Visits 13  Date for PT Re-Evaluation 05/11/19 (POC written for 90 days as she plans to have cataracts removed in Nov.)  [facility] [employer] 10th visit progress note  PT Time Calculation  PT Start Time 1017  PT Stop Time 1100  PT Time Calculation (min) 43 min  PT - End of Session  Equipment Utilized During Treatment Gait belt  Activity Tolerance Patient tolerated treatment well  Behavior During Therapy [facility] for tasks assessed/performed    Past Medical History:  Diagnosis Date  . 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 Ankle fracture, left   . Bladder cancer ([place]) 2011   3 times  . Diverticulosis    mild per colonoscopy  . Dyslipidemia   . Hemorrhoid    internal per colonoscopy 2016  . Hx of osteopenia   . NPH (normal pressure hydrocephalus) ([place]) 09/18/2016  . Stroke [facility])     Past Surgical History:  Procedure Laterality Date  . ABDOMINAL HYSTERECTOMY    . APPENDECTOMY    . BLADDER SURGERY     scope and removal of tumors  . BRAIN SURGERY    . 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 REFRACTIVE SURGERY    . TONSILLECTOMY      There were no vitals filed for this visit.     03/04/19 1020  Symptoms/Limitations  Subjective No new complaints. No falls or pain to report. Has not had an opportunity to do the HEP since last visit, hopes to have time this weekend.  Pertinent History normal pressure hydrocephalus s/p VP shunt at [place] (2 years ago), hx of bladder cancer, CVA, osteopenia  Limitations Walking   How long can you walk comfortably? 2 miles per day  Patient Stated Goals "I would like to become more sure of my footing.  I would like to increase my energy."  Pain Assessment  Currently in Pain? No/denies      03/04/19 1022  Functional Gait  Assessment  Gait assessed  Yes  Gait Level Surface 2 (6.33 sec's)  Change in Gait Speed 3  Gait with Horizontal Head Turns 2  Gait with Vertical Head Turns 2  Gait and Pivot Turn 2 (>4 sec's)  Step Over Obstacle 3  Gait with Narrow Base of Support 1 (5 steps)  Gait with Eyes Closed 2 (9.22 sec's)  Ambulating Backwards 2 (> 10 sec)  Steps 2  Total Score 21  FGA comment: 21/30= 19-24 means medium fall risk        03/04/19 1034  High Level Balance  High Level Balance Activities Marching forwards;Marching backwards;Head turns;Tandem walking (tandem/toe/heel walking fwd/bwd). Head turns left>right, then up<>down.   High Level Balance Comments on red/blue mats next to counter: 3-4 laps each with intermittent touch to counter for balance. cues on ex form, weight shifting and technique.   Neuro Re-ed   Neuro Re-ed Details  for balance/muscle re-ed: standing on airex with feet apart EC no head movements with no balance issues or sway noted; had pt place feet together for  EC with up to min assist needed for balance; modified tandem with EC no head movements with up to min assist for balance. no dizziness reported with any of these activities.     PT Short Term Goals - 03/04/19 1021      PT SHORT TERM GOAL #1   Title  Pt will be independent with initial HEP in order to indicate decreased fall risk and improved functional mobility.  (Target Date: 02/24/19 or 5th visit)    Baseline  03/04/19: met with current program after review at last session.    Status  Achieved      PT SHORT TERM GOAL #2   Title  Pt will improve FGA to 19/30 in order to indicate decreased fall risk.    Status  New      PT SHORT TERM GOAL #3   Title  Pt will perform  6MWT and appropriate LTG to be set in order to indicate improved functional endurance.    Baseline  1149' on 02/14/19    Status  Achieved            PT Long Term Goals - 02/10/19 1233      PT LONG TERM GOAL #1   Title  Pt will be independent with final HEP in order to indicate decreased fall risk and improved functional mobility.  (Target Date: 03/27/19 or 13th visit)    Time  6    Period  Weeks    Status  New    Target Date  03/27/19      PT LONG TERM GOAL #2   Title  Pt will improve FGA to >/=23/30 in order to indicate decreased fall risk.      PT LONG TERM GOAL #3   Title  Pt will improve gait speed to >/=3.6 ft/sec in order to indicate improved efficiency of gait.      PT LONG TERM GOAL #4   Title  Pt will ambulate x 1000' over indoor and outdoor paved surfaces while scanning environment without overt LOB in order to indicate safe community negotiation and return to leisure fitness.      PT LONG TERM GOAL #5   Title  Pt will improve 6MWT 150' from baseline in order to indicate improved functional endurance.         03/04/19 1021  Plan  Clinical Impression Statement Today's skilled session initially focused on STGs wtih all goals met. Remainder of session continued to focus on balance reactions with no issues reported in session. The pt is progressing toward goals and should benefit from continued PT to progress toward unmet goals.  Personal Factors and Comorbidities Age;Comorbidity 3+  Comorbidities see above  Examination-Activity Limitations Locomotion Level;Lift;Bend;Carry;Transfers;Stairs;Stand  Examination-Participation Restrictions Community Activity;Other (return to physical exercise)  Pt will benefit from skilled therapeutic intervention in order to improve on the following deficits Abnormal gait;Decreased activity tolerance;Decreased balance;Decreased coordination;Decreased endurance;Decreased mobility;Decreased strength;Dizziness;Impaired flexibility;Improper  body mechanics  Stability/Clinical Decision Making Evolving/Moderate complexity  Rehab Potential Good  PT Frequency 2x / week  PT Duration 6 weeks  PT Treatment/Interventions ADLs/Self Care Home Management;Aquatic Therapy;Gait training;Stair training;Functional mobility training;Therapeutic activities;Therapeutic exercise;Balance training;Neuromuscular re-education;Patient/family education;Vestibular  PT Next Visit Plan endurance (see how she does with increased gait speed/safety), high level balance with head turns, dynamic gait, balance on compliant surfaces that require increased vestibular imput  Consulted and Agree with Plan of Care Patient          Patient will benefit from skilled therapeutic intervention in order  to improve the following deficits and impairments:  Abnormal gait, Decreased activity tolerance, Decreased balance, Decreased coordination, Decreased endurance, Decreased mobility, Decreased strength, Dizziness, Impaired flexibility, Improper body mechanics  Visit Diagnosis: Unsteadiness on feet  Other abnormalities of gait and mobility  Dizziness and giddiness  Muscle weakness (generalized)     Problem List Patient Active Problem List   Diagnosis Date Noted  . NPH (normal pressure hydrocephalus) ([place]) 09/18/2016  . Left hand paresthesia 09/18/2016  . Left arm numbness 09/18/2016  . 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 Stroke ([place]) 03/20/2015  . Stroke with cerebral ischemia ([place])   . HLD (hyperlipidemia)   . Unsteady gait 03/19/2015  . Hx of osteopenia   . Bladder cancer ([place])   . Hemorrhoid   . Diverticulosis   . Ischemic stroke ([place])   . Malignant neoplasm of urinary bladder ([place])     Willow Ora, PTA, [facility] Outpatient Neuro [facility] [address] [place], [place] [address] [phone] 03/05/19, 7:01 PM   Name: Kylie Calhoun MRN: [id number] Date of Birth: [birth year]

## 2019-03-07 ENCOUNTER — Ambulatory Visit: Payer: Medicare Other | Admitting: Physical Therapy

## 2019-03-25 ENCOUNTER — Other Ambulatory Visit: Payer: Self-pay

## 2019-03-25 ENCOUNTER — Ambulatory Visit: Payer: Medicare Other | Attending: Internal Medicine | Admitting: Physical Therapy

## 2019-03-25 ENCOUNTER — Encounter: Payer: Self-pay | Admitting: Physical Therapy

## 2019-03-25 DIAGNOSIS — R2689 Other abnormalities of gait and mobility: Secondary | ICD-10-CM | POA: Insufficient documentation

## 2019-03-25 DIAGNOSIS — R2681 Unsteadiness on feet: Secondary | ICD-10-CM | POA: Insufficient documentation

## 2019-03-25 DIAGNOSIS — M6281 Muscle weakness (generalized): Secondary | ICD-10-CM | POA: Insufficient documentation

## 2019-03-25 DIAGNOSIS — R42 Dizziness and giddiness: Secondary | ICD-10-CM | POA: Insufficient documentation

## 2019-03-26 NOTE — Therapy (Signed)
[facility] [address] [place], [place], [address] Phone: [phone]   Fax:  [phone]  Physical Therapy Treatment  Patient Details  Name: Kylie Calhoun MRN: [id number] Date of Birth: 1950-11-02 Referring Provider (PT): Marton Redwood, MD   Encounter Date: 03/25/2019   03/25/19 1406  PT Visits / Re-Eval  Visit Number 6  Number of Visits 13  Date for PT Re-Evaluation 05/11/19 (POC written for 90 days as she plans to have cataracts removed in Nov.)  [place] [employer] 10th visit progress note  PT Time Calculation  PT Start Time 1403  PT Stop Time 1442  PT Time Calculation (min) 39 min  PT - End of Session  Equipment Utilized During Treatment Gait belt  Activity Tolerance Patient tolerated treatment well  Behavior During Therapy [facility] for tasks assessed/performed     Past Medical History:  Diagnosis Date  . 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 Ankle fracture, left   . Bladder cancer ([place]) 2011   3 times  . Diverticulosis    mild per colonoscopy  . Dyslipidemia   . Hemorrhoid    internal per colonoscopy 2016  . Hx of osteopenia   . NPH (normal pressure hydrocephalus) ([place]) 09/18/2016  . Stroke [facility])     Past Surgical History:  Procedure Laterality Date  . ABDOMINAL HYSTERECTOMY    . APPENDECTOMY    . BLADDER SURGERY     scope and removal of tumors  . BRAIN SURGERY    . 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 REFRACTIVE SURGERY    . TONSILLECTOMY      There were no vitals filed for this visit.     03/25/19 1405  Symptoms/Limitations  Subjective No new complaints. No falls or pain to report. Had her other cataract surgery, reports being able to see so much better. Doing the HEP, especially the pillow ones seem to be helping. Reporting minimal dizziness every day, does state it's getting better.  Pertinent History normal  pressure hydrocephalus s/p VP shunt at [place] (2 years ago), hx of bladder cancer, CVA, osteopenia  Limitations Walking  Patient Stated Goals "I would like to become more sure of my footing.  I would like to increase my energy."  Pain Assessment  Currently in Pain? No/denies      03/25/19 1409  Transfers  Transfers Sit to Stand;Stand to Sit  Sit to Stand 6: Modified independent (Device/Increase time)  Stand to Sit 6: Modified independent (Device/Increase time)  Ambulation/Gait  Ambulation/Gait Yes  Ambulation/Gait Assistance 4: Min guard  Ambulation Distance (Feet) 230 Feet (x1, plus around gym with session)  Assistive device None  Gait Pattern Step-through pattern;Decreased arm swing - left;Decreased stride length;Decreased weight shift to left;Trunk flexed  Ambulation Surface Level;Indoor  Gait Comments gait around track working on speed changes, looking left<>fwd<>right, then up<>fwd<>down, mild decrease in gait speed noted with minor veering noted as well.         03/25/19 1412  Balance Exercises: Standing  Standing Eyes Closed Narrow base of support (BOS);Wide (BOA);Head turns;Foam/compliant surface;Other reps (comment);30 secs;Limitations  Rockerboard Anterior/posterior;Lateral;EO;EC;30 seconds  Balance Beam standing across blue foam beam: alternating fwd stepping to floor/back onto the beam, then alternating bwd steppping to floor/back onto beam, 10 reps each with no UE support to occasional touch to bars. cues for larger steps and weight shifting. min guard assist.   Tandem Gait Retro;Forward;Intermittent  upper extremity support;3 reps;Limitations  Sidestepping Foam/compliant support;3 reps;Limitations  Balance Exercises: Standing  Standing Eyes Closed Limitations on airex with no UE support in corner for safety: feet together for EC no head movements, then feet apart for EC head movements left<>right, then up<>down. min guard to min assist for balance with mostly posterior  loss noted.    Sidestepping Limitations on blue foam beam: 3 laps with occasional touch to bars for balance. cues for step length, weight shifting and posture.    Tandem Gait Limitations on blue foam beam: 3 laps with occasional touch to bars, cues on form and technique.   Rebounder Limitations both ways on balance board- rocking the board with EO progressingto EC with emphasis on tall posture; holding the board steady with EC no head movements, progressing to EC head movements left<>right, then up<>down. min guard to min assist for balance with cues on posture and weight shifting for balance.                    PT Short Term Goals - 03/04/19 1021      PT SHORT TERM GOAL #1   Title  Pt will be independent with initial HEP in order to indicate decreased fall risk and improved functional mobility.  (Target Date: 02/24/19 or 5th visit)    Baseline  03/04/19: met with current program after review at last session.    Status  Achieved      PT SHORT TERM GOAL #2   Title  Pt will improve FGA to 19/30 in order to indicate decreased fall risk.    Baseline  05/03/18: 21/30 met today    Status  Achieved      PT SHORT TERM GOAL #3   Title  Pt will perform 6MWT and appropriate LTG to be set in order to indicate improved functional endurance.    Baseline  1149' on 02/14/19    Status  Achieved        PT Long Term Goals - 02/10/19 1233      PT LONG TERM GOAL #1   Title  Pt will be independent with final HEP in order to indicate decreased fall risk and improved functional mobility.  (Target Date: 03/27/19 or 13th visit)    Time  6    Period  Weeks    Status  New    Target Date  03/27/19      PT LONG TERM GOAL #2   Title  Pt will improve FGA to >/=23/30 in order to indicate decreased fall risk.      PT LONG TERM GOAL #3   Title  Pt will improve gait speed to >/=3.6 ft/sec in order to indicate improved efficiency of gait.      PT LONG TERM GOAL #4   Title  Pt will ambulate x 1000' over indoor  and outdoor paved surfaces while scanning environment without overt LOB in order to indicate safe community negotiation and return to leisure fitness.      PT LONG TERM GOAL #5   Title  Pt will improve 6MWT 150' from baseline in order to indicate improved functional endurance.         03/25/19 1406  Plan  Clinical Impression Statement Today's skilled session focused on dynamic gait and balance reactions with no issues reported or noted in session. The pt is progressing toward goals and should benefit from continued PT to progress toward unmet goals.  Personal Factors and Comorbidities Age;Comorbidity 3+  Comorbidities  see above  Examination-Activity Limitations Locomotion Level;Lift;Bend;Carry;Transfers;Stairs;Stand  Examination-Participation Restrictions Community Activity;Other (return to physical exercise)  Pt will benefit from skilled therapeutic intervention in order to improve on the following deficits Abnormal gait;Decreased activity tolerance;Decreased balance;Decreased coordination;Decreased endurance;Decreased mobility;Decreased strength;Dizziness;Impaired flexibility;Improper body mechanics  Stability/Clinical Decision Making Evolving/Moderate complexity  Rehab Potential Good  PT Frequency 2x / week  PT Duration 6 weeks  PT Treatment/Interventions ADLs/Self Care Home Management;Aquatic Therapy;Gait training;Stair training;Functional mobility training;Therapeutic activities;Therapeutic exercise;Balance training;Neuromuscular re-education;Patient/family education;Vestibular  PT Next Visit Plan endurance (see how she does with increased gait speed/safety), high level balance with head turns, dynamic gait, balance on compliant surfaces that require increased vestibular imput  Consulted and Agree with Plan of Care Patient          Patient will benefit from skilled therapeutic intervention in order to improve the following deficits and impairments:  Abnormal gait, Decreased  activity tolerance, Decreased balance, Decreased coordination, Decreased endurance, Decreased mobility, Decreased strength, Dizziness, Impaired flexibility, Improper body mechanics  Visit Diagnosis: Unsteadiness on feet  Other abnormalities of gait and mobility  Dizziness and giddiness  Muscle weakness (generalized)     Problem List Patient Active Problem List   Diagnosis Date Noted  . NPH (normal pressure hydrocephalus) ([place]) 09/18/2016  . Left hand paresthesia 09/18/2016  . Left arm numbness 09/18/2016  . 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 Stroke ([place]) 03/20/2015  . Stroke with cerebral ischemia ([place])   . HLD (hyperlipidemia)   . Unsteady gait 03/19/2015  . Hx of osteopenia   . Bladder cancer ([place])   . Hemorrhoid   . Diverticulosis   . Ischemic stroke ([place])   . Malignant neoplasm of urinary bladder ([place])     Willow Ora, PTA, [facility] [address] [place], [place] [address] [phone] 03/27/19, 12:03 AM   Name: Kylie Calhoun MRN: [id number] Date of Birth: [birth year]

## 2019-03-31 ENCOUNTER — Other Ambulatory Visit: Payer: Self-pay

## 2019-03-31 ENCOUNTER — Encounter: Payer: Self-pay | Admitting: Physical Therapy

## 2019-03-31 ENCOUNTER — Ambulatory Visit: Payer: Medicare Other | Admitting: Physical Therapy

## 2019-03-31 DIAGNOSIS — M6281 Muscle weakness (generalized): Secondary | ICD-10-CM

## 2019-03-31 DIAGNOSIS — R2681 Unsteadiness on feet: Secondary | ICD-10-CM

## 2019-03-31 DIAGNOSIS — R2689 Other abnormalities of gait and mobility: Secondary | ICD-10-CM

## 2019-03-31 NOTE — Patient Instructions (Signed)
Access Code: [email or web address]  URL: [email or web address]  Date: 03/31/2019  Prepared by: Nita Sells   Exercises  Forward Step Over with Counter Support - 10 reps - 2 sets - 2x daily - 7x weekly  Alternating Step Taps with Counter Support - 10 reps - 2 sets - 2x daily - 7x weekly  Forward Step Up with Counter Support - 10 reps - 2 sets - 2x daily - 7x weekly  Seated Ankle Dorsiflexion with Resistance - 10 reps - 2 sets - 2x daily - 7x weekly  Standing Hip Abduction with Resistance at Ankles - 10 reps - 2 sets - 2x daily - 7x weekly  Standing March with Counter Support - 10 reps - 2 sets - 2x daily - 7x weekly

## 2019-03-31 NOTE — Therapy (Signed)
[facility] [address] [place], [place], [address] Phone: [phone]   Fax:  [phone]  Physical Therapy Treatment  Patient Details  Name: Kylie Calhoun MRN: [id number] Date of Birth: 1950-08-06 Referring Provider (PT): Marton Redwood, MD   Encounter Date: 03/31/2019  PT End of Session - 03/31/19 1305    Visit Number  7    Number of Visits  13    Date for PT Re-Evaluation  05/11/19   POC written for 90 days as she plans to have cataracts removed in Nov.   [place] [employer] 10th visit progress note    PT Start Time  0931    PT Stop Time  1015    PT Time Calculation (min)  44 min    Equipment Utilized During Treatment  Gait belt    Activity Tolerance  Patient tolerated treatment well    Behavior During Therapy  [facility] for tasks assessed/performed       Past Medical History:  Diagnosis Date  . 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 Ankle fracture, left   . Bladder cancer ([place]) 2011   3 times  . Diverticulosis    mild per colonoscopy  . Dyslipidemia   . Hemorrhoid    internal per colonoscopy 2016  . Hx of osteopenia   . NPH (normal pressure hydrocephalus) ([place]) 09/18/2016  . Stroke [facility])     Past Surgical History:  Procedure Laterality Date  . ABDOMINAL HYSTERECTOMY    . APPENDECTOMY    . BLADDER SURGERY     scope and removal of tumors  . BRAIN SURGERY    . 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 REFRACTIVE SURGERY    . TONSILLECTOMY      There were no vitals filed for this visit.  Subjective Assessment - 03/31/19 0935    Subjective  Pt reports that she's been having problems with R ear for the past several months and has "2 holes in her ear drums" and wonders if that is what is contributing to her dizziness.  Will have surgery to patch the holes by the end of January.    Pertinent History  normal pressure hydrocephalus s/p VP shunt at [place] (2  years ago), hx of bladder cancer, CVA, osteopenia    Limitations  Walking    Patient Stated Goals  "I would like to become more sure of my footing.  I would like to increase my energy."    Currently in Pain?  No/denies        [facility] Adult PT Treatment/Exercise - 03/31/19 0001      Transfers   Transfers  Sit to Stand;Stand to Sit    Sit to Stand  6: Modified independent (Device/Increase time)    Stand to Sit  6: Modified independent (Device/Increase time)      Ambulation/Gait   Ambulation/Gait  Yes    Ambulation/Gait Assistance  5: Supervision    Ambulation Distance (Feet)  --   around clinic for activities   Assistive device  None    Gait Pattern  Step-through pattern;Decreased arm swing - left;Decreased stride length;Decreased weight shift to left;Trunk flexed    Ambulation Surface  Level;Indoor    Gait Comments  appears fearful during gait with hands guarded      High Level Balance   High Level Balance Activities  Marching forwards;Marching backwards;Other (comment)    High Level Balance Comments  in // bars for strengthening x 3 reps        Exercises  Forward Step Over with Counter Support - 10 reps  Alternating Step Taps with Counter Support - 10 reps Forward Step Up with Counter Support - 10 reps - Seated Ankle Dorsiflexion with Resistance - 10 reps - on L with green band Standing Hip Abduction with Resistance at Ankles - 10 reps with red band Standing March with Counter Support - 10 reps with red band         PT Education - 03/31/19 1312    Education Details  HEP    Person(s) Educated  Patient    Methods  Explanation;Demonstration;Handout    Comprehension  Verbalized understanding;Returned demonstration       PT Short Term Goals - 03/04/19 1021      PT SHORT TERM GOAL #1   Title  Pt will be independent with initial HEP in order to indicate decreased fall risk and improved functional mobility.  (Target Date: 02/24/19 or 5th visit)    Baseline  03/04/19: met  with current program after review at last session.    Status  Achieved      PT SHORT TERM GOAL #2   Title  Pt will improve FGA to 19/30 in order to indicate decreased fall risk.    Baseline  05/03/18: 21/30 met today    Status  Achieved      PT SHORT TERM GOAL #3   Title  Pt will perform 6MWT and appropriate LTG to be set in order to indicate improved functional endurance.    Baseline  1149' on 02/14/19    Status  Achieved        PT Long Term Goals - 02/10/19 1233      PT LONG TERM GOAL #1   Title  Pt will be independent with final HEP in order to indicate decreased fall risk and improved functional mobility.  (Target Date: 03/27/19 or 13th visit)    Time  6    Period  Weeks    Status  New    Target Date  03/27/19      PT LONG TERM GOAL #2   Title  Pt will improve FGA to >/=23/30 in order to indicate decreased fall risk.      PT LONG TERM GOAL #3   Title  Pt will improve gait speed to >/=3.6 ft/sec in order to indicate improved efficiency of gait.      PT LONG TERM GOAL #4   Title  Pt will ambulate x 1000' over indoor and outdoor paved surfaces while scanning environment without overt LOB in order to indicate safe community negotiation and return to leisure fitness.      PT LONG TERM GOAL #5   Title  Pt will improve 6MWT 150' from baseline in order to indicate improved functional endurance.            Plan - 03/31/19 1306    Clinical Impression Statement  Skilled session focused on adding LE strengthening exercises to patient's HEP.  Pt with decreased balance with SLS and stepping weight shifting.  Continue PT per POC.    Personal Factors and Comorbidities  Age;Comorbidity 3+    Comorbidities  see above    Examination-Activity Limitations  Locomotion Level;Lift;Bend;Carry;Transfers;Stairs;Stand    Examination-Participation Restrictions  Community Activity;Other   return to physical exercise   Stability/Clinical Decision Making  Evolving/Moderate complexity    Rehab  Potential  Good    PT  Frequency  2x / week    PT Duration  6 weeks    PT Treatment/Interventions  ADLs/Self Care Home Management;Aquatic Therapy;Gait training;Stair training;Functional mobility training;Therapeutic activities;Therapeutic exercise;Balance training;Neuromuscular re-education;Patient/family education;Vestibular    PT Next Visit Plan  How did strengthening exercises go that were added to HEP.  Continue LE strengthening for balance reaction/recovery.  endurance (see how she does with increased gait speed/safety), high level balance with head turns, dynamic gait, balance on compliant surfaces that require increased vestibular imput    Consulted and Agree with Plan of Care  Patient       Patient will benefit from skilled therapeutic intervention in order to improve the following deficits and impairments:  Abnormal gait, Decreased activity tolerance, Decreased balance, Decreased coordination, Decreased endurance, Decreased mobility, Decreased strength, Dizziness, Impaired flexibility, Improper body mechanics  Visit Diagnosis: Unsteadiness on feet  Other abnormalities of gait and mobility  Muscle weakness (generalized)     Problem List Patient Active Problem List   Diagnosis Date Noted  . NPH (normal pressure hydrocephalus) ([place]) 09/18/2016  . Left hand paresthesia 09/18/2016  . Left arm numbness 09/18/2016  . 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 Stroke ([place]) 03/20/2015  . Stroke with cerebral ischemia ([place])   . HLD (hyperlipidemia)   . Unsteady gait 03/19/2015  . Hx of osteopenia   . Bladder cancer ([place])   . Hemorrhoid   . Diverticulosis   . Ischemic stroke ([place])   . 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 [place], [place], [address] Phone: [phone]   Fax:  [phone]  Name: Montrice Montuori MRN: [id number] Date of Birth: Oct 21, 1950

## 2019-04-04 ENCOUNTER — Other Ambulatory Visit: Payer: Self-pay

## 2019-04-04 ENCOUNTER — Ambulatory Visit: Payer: Medicare Other | Admitting: Rehabilitation

## 2019-04-04 ENCOUNTER — Encounter: Payer: Self-pay | Admitting: Rehabilitation

## 2019-04-04 DIAGNOSIS — M6281 Muscle weakness (generalized): Secondary | ICD-10-CM

## 2019-04-04 DIAGNOSIS — R42 Dizziness and giddiness: Secondary | ICD-10-CM

## 2019-04-04 DIAGNOSIS — R2681 Unsteadiness on feet: Secondary | ICD-10-CM

## 2019-04-04 DIAGNOSIS — R2689 Other abnormalities of gait and mobility: Secondary | ICD-10-CM

## 2019-04-04 NOTE — Therapy (Signed)
Kylie [place] [address] [place], [place], [address] Phone: [phone]   Fax:  [phone]  Physical Therapy Treatment  Patient Details  Name: Kylie Calhoun MRN: [id number] Date of Birth: 1950/12/02 Referring Provider (PT): Marton Redwood, MD   Encounter Date: 04/04/2019  PT End of Session - 04/04/19 1517    Visit Number  8    Number of Visits  13    Date for PT Re-Evaluation  05/11/19   POC written for 90 days as she plans to have cataracts removed in Nov.   [place] [employer] 10th visit progress note    PT Start Time  1102    PT Stop Time  1145    PT Time Calculation (min)  43 min    Equipment Utilized During Treatment  Gait belt    Activity Tolerance  Patient tolerated treatment well    Behavior During Therapy  [facility] for tasks assessed/performed       Past Medical History:  Diagnosis Date  . 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 Ankle fracture, left   . Bladder cancer ([place]) 2011   3 times  . Diverticulosis    mild per colonoscopy  . Dyslipidemia   . Hemorrhoid    internal per colonoscopy 2016  . Hx of osteopenia   . NPH (normal pressure hydrocephalus) ([place]) 09/18/2016  . Stroke [facility])     Past Surgical History:  Procedure Laterality Date  . ABDOMINAL HYSTERECTOMY    . APPENDECTOMY    . BLADDER SURGERY     scope and removal of tumors  . BRAIN SURGERY    . 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 REFRACTIVE SURGERY    . TONSILLECTOMY      There were no vitals filed for this visit.  Subjective Assessment - 04/04/19 1107    Subjective  Pt reports she will be having surgery to repair "holes in eardrum" in January.    Pertinent History  normal pressure hydrocephalus s/p VP shunt at [place] (2 years ago), hx of bladder cancer, CVA, osteopenia    Limitations  Walking    How long can you walk comfortably?  2 miles per day    Patient Stated Goals  "I  would like to become more sure of my footing.  I would like to increase my energy."    Currently in Pain?  No/denies                       [facility] Adult PT Treatment/Exercise - 04/04/19 1115      Ambulation/Gait   Ambulation/Gait  Yes    Ambulation/Gait Assistance  5: Supervision    Ambulation/Gait Assistance Details  Assessed outdoor gait during session.  Pt is mod I for move unlevel paved surfaces, but would recommend at least S for grassy surfaces, however only demo'd mild LOB during session.  She was able to self correct.      Ambulation Distance (Feet)  1000 Feet    Assistive device  None    Gait Pattern  Step-through pattern;Decreased arm swing - left;Decreased stride length;Decreased weight shift to left;Trunk flexed    Ambulation Surface  Level;Unlevel;Indoor;Outdoor;Paved;Grass    Gait Comments  While outdoors, performed gait with head turns up/down x 25', side to side x 25', backwards walking x 25' with decreased walking speed but no overt LOB.  Access Code: [email or web address]      URL: [email or web address]    Date: 02/14/2019  Prepared by: Cameron Sprang   Exercises Seated Gaze Stabilization with Head Rotation - 3 sets - 20 hold - 2x daily - 7x weekly Romberg Stance with Head Rotation - 2 sets - 20 hold - 2x daily - 7x weekly Romberg Stance with Eyes Closed - 3 sets - 20 hold - 2x daily - 7x weekly Tandem Stance - 2 sets - 20 hold - 2x daily - 7x weekly Romberg Stance on Foam Pad with Head Rotation - 2 sets - 20 hold - 2x daily - 7x weekly (should be feet apart)  Verbally reviewed these exercises and discussed that she can perform gaze stabilization exercises standing from now on.  Also discussed that we would review these at next session and upgrade prior to D/C on 1/4.    Actually performed these exercises during session.  Exercises   Forward Step Over with Counter Support - 10 reps   Alternating Step Taps with Counter Support - 10  reps  Forward Step Up with Counter Support - 10 reps -  Seated Ankle Dorsiflexion with Resistance - 10 reps - on L with green band  Standing Hip Abduction with Resistance at Ankles - 10 reps with red band Standing March with Counter Support - 10 reps with red band      PT Education - 04/04/19 1517    Education Details  Reviewed HEP, verbally reviewed balance HEP with plans to review and update on next session and D/C on 1/4.    Person(s) Educated  Patient    Methods  Explanation;Demonstration;Handout    Comprehension  Verbalized understanding;Returned demonstration       PT Short Term Goals - 03/04/19 1021      PT SHORT TERM GOAL #1   Title  Pt will be independent with initial HEP in order to indicate decreased fall risk and improved functional mobility.  (Target Date: 02/24/19 or 5th visit)    Baseline  03/04/19: met with current program after review at last session.    Status  Achieved      PT SHORT TERM GOAL #2   Title  Pt will improve FGA to 19/30 in order to indicate decreased fall risk.    Baseline  05/03/18: 21/30 met today    Status  Achieved      PT SHORT TERM GOAL #3   Title  Pt will perform 6MWT and appropriate LTG to be set in order to indicate improved functional endurance.    Baseline  1149' on 02/14/19    Status  Achieved        PT Long Term Goals - 02/10/19 1233      PT LONG TERM GOAL #1   Title  Pt will be independent with final HEP in order to indicate decreased fall risk and improved functional mobility.  (Target Date: 03/27/19 or 13th visit)    Time  6    Period  Weeks    Status  New    Target Date  03/27/19      PT LONG TERM GOAL #2   Title  Pt will improve FGA to >/=23/30 in order to indicate decreased fall risk.      PT LONG TERM GOAL #3   Title  Pt will improve gait speed to >/=3.6 ft/sec in order to indicate improved efficiency of gait.      PT LONG TERM GOAL #4   Title  Pt  will ambulate x 1000' over indoor and outdoor paved surfaces  while scanning environment without overt LOB in order to indicate safe community negotiation and return to leisure fitness.      PT LONG TERM GOAL #5   Title  Pt will improve 6MWT 150' from baseline in order to indicate improved functional endurance.            Plan - 04/04/19 1518    Clinical Impression Statement  Skilled session reviewed strengthening HEP from last session.  Pt needed min cues for technique, but overall doing well.  Assessed outdoor gait with dynamic challenges with no overt LOB.  Feel that we can start to wrap up in the next two visits as she has progressed very well and is diligent with exercises at home.  Pt verbalized understanding.    Personal Factors and Comorbidities  Age;Comorbidity 3+    Comorbidities  see above    Examination-Activity Limitations  Locomotion Level;Lift;Bend;Carry;Transfers;Stairs;Stand    Examination-Participation Restrictions  Community Activity;Other   return to physical exercise   Stability/Clinical Decision Making  Evolving/Moderate complexity    Clinical Decision Making  Moderate    Rehab Potential  Good    PT Frequency  2x / week    PT Duration  6 weeks    PT Treatment/Interventions  ADLs/Self Care Home Management;Aquatic Therapy;Gait training;Stair training;Functional mobility training;Therapeutic activities;Therapeutic exercise;Balance training;Neuromuscular re-education;Patient/family education;Vestibular    PT Next Visit Plan  Review balance exercises from second session (Beryl Hornberger's) and update as needed, start to check LTGs for D/C on 1/4.    Consulted and Agree with Plan of Care  Patient       Patient will benefit from skilled therapeutic intervention in order to improve the following deficits and impairments:  Abnormal gait, Decreased activity tolerance, Decreased balance, Decreased coordination, Decreased endurance, Decreased mobility, Decreased strength, Dizziness, Impaired flexibility, Improper body mechanics  Visit  Diagnosis: Unsteadiness on feet  Other abnormalities of gait and mobility  Muscle weakness (generalized)  Dizziness and giddiness     Problem List Patient Active Problem List   Diagnosis Date Noted  . NPH (normal pressure hydrocephalus) ([place]) 09/18/2016  . Left hand paresthesia 09/18/2016  . Left arm numbness 09/18/2016  . 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 Stroke ([place]) 03/20/2015  . Stroke with cerebral ischemia ([place])   . HLD (hyperlipidemia)   . Unsteady gait 03/19/2015  . Hx of osteopenia   . Bladder cancer ([place])   . Hemorrhoid   . Diverticulosis   . Ischemic stroke ([place])   . Malignant neoplasm of urinary bladder ([place])     Cameron Sprang, PT, MPT [facility] [address] [place], [place], [address] Phone: [phone]   Fax:  [phone] 04/04/19, 3:24 PM  Name: Briley Bumgarner MRN: [id number] Date of Birth: 08-Aug-1950

## 2019-04-04 NOTE — Patient Instructions (Addendum)
Access Code: [email or web address]      URL: [email or web address]    Date: 02/14/2019  Prepared by: Cameron Sprang   Exercises Seated Gaze Stabilization with Head Rotation - 3 sets - 20 hold - 2x daily - 7x weekly Romberg Stance with Head Rotation - 2 sets - 20 hold - 2x daily - 7x weekly Romberg Stance with Eyes Closed - 3 sets - 20 hold - 2x daily - 7x weekly Tandem Stance - 2 sets - 20 hold - 2x daily - 7x weekly Romberg Stance on Foam Pad with Head Rotation - 2 sets - 20 hold - 2x daily - 7x weekly (should be feet apart)  Verbally reviewed these exercises and discussed that she can perform gaze stabilization exercises standing from now on.  Also discussed that we would review these at next session and upgrade prior to D/C on 1/4.    Actually performed these exercises during session.  Exercises   Forward Step Over with Counter Support - 10 reps   Alternating Step Taps with Counter Support - 10 reps  Forward Step Up with Counter Support - 10 reps -  Seated Ankle Dorsiflexion with Resistance - 10 reps - on L with green band  Standing Hip Abduction with Resistance at Ankles - 10 reps with red band  Standing March with Counter Support - 10 reps with red band

## 2019-04-11 ENCOUNTER — Ambulatory Visit: Payer: Medicare Other | Admitting: Rehabilitation

## 2019-04-11 ENCOUNTER — Encounter: Payer: Self-pay | Admitting: Rehabilitation

## 2019-04-11 ENCOUNTER — Other Ambulatory Visit: Payer: Self-pay

## 2019-04-11 DIAGNOSIS — R2689 Other abnormalities of gait and mobility: Secondary | ICD-10-CM

## 2019-04-11 DIAGNOSIS — R2681 Unsteadiness on feet: Secondary | ICD-10-CM

## 2019-04-11 DIAGNOSIS — R42 Dizziness and giddiness: Secondary | ICD-10-CM

## 2019-04-11 DIAGNOSIS — M6281 Muscle weakness (generalized): Secondary | ICD-10-CM

## 2019-04-11 NOTE — Therapy (Signed)
[facility] [address] [place], [place], [address] Phone: [phone]   Fax:  [phone]  Physical Therapy Treatment  Patient Details  Name: Kylie Cal[place] MRN: [id number] Date of Birth: [birth year] Referring Provider (PT): Marton Redwood, MD   Encounter Date: 04/11/2019  PT End of Session - 04/11/19 1129    Visit Number  9    Number of Visits  13    Date for PT Re-Evaluation  05/11/19   POC written for 90 days as she plans to have cataracts removed in Nov.   [place] Medicare 10th visit progress note    PT Start Time  0932    PT Stop Time  1015    PT Time Calculation (min)  43 min    Equipment Utilized During Treatment  Gait belt    Activity Tolerance  Patient tolerated treatment well    Behavior During Therapy  [facility] for tasks assessed/performed       Past Medical History:  Diagnosis Date  . 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 Ankle fracture, left   . Bladder cancer ([place]) 2011   3 times  . Diverticulosis    mild per colonoscopy  . Dyslipidemia   . Hemorrhoid    internal per colonoscopy 2016  . Hx of osteopenia   . NPH (normal pressure hydrocephalus) ([place]) 09/18/2016  . Stroke [facility])     Past Surgical History:  Procedure Laterality Date  . ABDOMINAL HYSTERECTOMY    . APPENDECTOMY    . BLADDER SURGERY     scope and removal of tumors  . BRAIN SURGERY    . 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 REFRACTIVE SURGERY    . TONSILLECTOMY      There were no vitals filed for this visit.  Subjective Assessment - 04/11/19 0935    Subjective  No changes, no falls since last visit.    Pertinent History  normal pressure hydrocephalus s/p VP shunt at [place] (2 years ago), hx of bladder cancer, CVA, osteopenia    Limitations  Walking    How long can you walk comfortably?  2 miles per day    Patient Stated Goals  "I would like to become more sure of my  footing.  I would like to increase my energy."    Currently in Pain?  No/denies                       [facility] Adult PT Treatment/Exercise - 04/11/19 0945      Neuro Re-ed    Neuro Re-ed Details   Continue to work on high level balance, ankle and hip strategy, SLS and compliant surfaces during session.  Reviewed previous HEP (balance) and upgraded as needed.  See pt instructions for details on these exercises.  Also performed exercises in // bars as follows: standing on foam beam perpendicularly maintaining balance with feet apart x 20 secs, alternating cone taps while on beam x 10 reps, tapping two cones before returning to beam on each side x 10 reps to increase time spent in SLS (intermittent min/guard and use of UEs needed), Alternating cone taps while side stepping on beam x 6 reps (4 sets), tandem gait with alternating cone taps while on beam x 5 reps (4 sets) with intermittent UE support.  Ended with balance on BOSU with feet apart maintaining balance x 30 secs, mini squats  on BOSU x 10 reps without UE support.           Access Code: [email or web address]  URL: [email or web address]  Date: 04/11/2019  Prepared by: Cameron Sprang   Exercises Romberg Stance Eyes Closed on Foam Pad - 10 reps - 3 sets - 1x daily - 7x weekly Foam Balance Tandem stance - 2 reps - 1 sets - 20 hold - 2x daily                            - 7x weekly Wide Stance with Eyes Closed and Head Rotation on Foam Pad - 10 reps - 1 sets - 2x daily - 7x weekly Romberg Stance on Foam Pad with Head Rotation - 2 sets - 20 hold - 2x daily - 7x weekly Standing Gaze Stabilization with Head Rotation - 2 reps - 1 sets - 30 secs hold - 2x daily - 7x weekly Wide Stance with Eyes Closed and Head Nods on Foam Pad - 10 reps - 1 sets - 2x daily - 7x weekly     PT Education - 04/11/19 0936    Education Details  updated balance HEP, plan to D/C next visit    Person(s) Educated  Patient    Methods  Explanation     Comprehension  Verbalized understanding;Returned demonstration       PT Short Term Goals - 03/04/19 1021      PT SHORT TERM GOAL #1   Title  Pt will be independent with initial HEP in order to indicate decreased fall risk and improved functional mobility.  (Target Date: 02/24/19 or 5th visit)    Baseline  03/04/19: met with current program after review at last session.    Status  Achieved      PT SHORT TERM GOAL #2   Title  Pt will improve FGA to 19/30 in order to indicate decreased fall risk.    Baseline  05/03/18: 21/30 met today    Status  Achieved      PT SHORT TERM GOAL #3   Title  Pt will perform 6MWT and appropriate LTG to be set in order to indicate improved functional endurance.    Baseline  1149' on 02/14/19    Status  Achieved        PT Long Term Goals - 02/10/19 1233      PT LONG TERM GOAL #1   Title  Pt will be independent with final HEP in order to indicate decreased fall risk and improved functional mobility.  (Target Date: 03/27/19 or 13th visit)    Time  6    Period  Weeks    Status  New    Target Date  03/27/19      PT LONG TERM GOAL #2   Title  Pt will improve FGA to >/=23/30 in order to indicate decreased fall risk.      PT LONG TERM GOAL #3   Title  Pt will improve gait speed to >/=3.6 ft/sec in order to indicate improved efficiency of gait.      PT LONG TERM GOAL #4   Title  Pt will ambulate x 1000' over indoor and outdoor paved surfaces while scanning environment without overt LOB in order to indicate safe community negotiation and return to leisure fitness.      PT LONG TERM GOAL #5   Title  Pt will improve 6MWT 150' from baseline in order to indicate improved functional endurance.  Plan - 04/11/19 1130    Clinical Impression Statement  Skilled session focused on review of previous balance HEP (from second visit) and making changes as needed to increase challenges.  Pt making excellent progress and feel that she will be ready for DC  following goals at next visit.  Pt verbalized understanding.    Personal Factors and Comorbidities  Age;Comorbidity 3+    Comorbidities  see above    Examination-Activity Limitations  Locomotion Level;Lift;Bend;Carry;Transfers;Stairs;Stand    Examination-Participation Restrictions  Community Activity;Other   return to physical exercise   Stability/Clinical Decision Making  Evolving/Moderate complexity    Rehab Potential  Good    PT Frequency  2x / week    PT Duration  6 weeks    PT Treatment/Interventions  ADLs/Self Care Home Management;Aquatic Therapy;Gait training;Stair training;Functional mobility training;Therapeutic activities;Therapeutic exercise;Balance training;Neuromuscular re-education;Patient/family education;Vestibular    PT Next Visit Plan  check LTGs and D/C (send to [facility])    Consulted and Agree with Plan of Care  Patient       Patient will benefit from skilled therapeutic intervention in order to improve the following deficits and impairments:  Abnormal gait, Decreased activity tolerance, Decreased balance, Decreased coordination, Decreased endurance, Decreased mobility, Decreased strength, Dizziness, Impaired flexibility, Improper body mechanics  Visit Diagnosis: Unsteadiness on feet  Other abnormalities of gait and mobility  Muscle weakness (generalized)  Dizziness and giddiness     Problem List Patient Active Problem List   Diagnosis Date Noted  . NPH (normal pressure hydrocephalus) ([place]) 09/18/2016  . Left hand paresthesia 09/18/2016  . Left arm numbness 09/18/2016  . 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 Stroke ([place]) 03/20/2015  . Stroke with cerebral ischemia ([place])   . HLD (hyperlipidemia)   . Unsteady gait 03/19/2015  . Hx of osteopenia   . Bladder cancer ([place])   . Hemorrhoid   . Diverticulosis   . Ischemic stroke ([place])   . 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

## 2019-04-11 NOTE — Patient Instructions (Signed)
Access Code: [email or web address]  URL: [email or web address]  Date: 04/11/2019  Prepared by: Cameron Sprang   Exercises Romberg Stance Eyes Closed on Foam Pad - 10 reps - 3 sets - 1x daily - 7x weekly Foam Balance Tandem stance - 2 reps - 1 sets - 20 hold - 2x daily                            - 7x weekly Wide Stance with Eyes Closed and Head Rotation on Foam Pad - 10 reps - 1 sets - 2x daily - 7x weekly Romberg Stance on Foam Pad with Head Rotation - 2 sets - 20 hold - 2x daily - 7x weekly Standing Gaze Stabilization with Head Rotation - 2 reps - 1 sets - 30 secs hold - 2x daily - 7x weekly Wide Stance with Eyes Closed and Head Nods on Foam Pad - 10 reps - 1 sets - 2x daily - 7x weekly

## 2019-04-13 ENCOUNTER — Telehealth: Payer: Self-pay

## 2019-04-13 NOTE — Telephone Encounter (Signed)
Cancelled pts appt, provider tested positive for Covid.   Informed pt of cancellation.  Please r/s

## 2019-04-18 ENCOUNTER — Encounter: Payer: Self-pay | Admitting: Physical Therapy

## 2019-04-18 ENCOUNTER — Ambulatory Visit: Payer: Medicare Other | Attending: Internal Medicine | Admitting: Physical Therapy

## 2019-04-18 ENCOUNTER — Other Ambulatory Visit: Payer: Self-pay

## 2019-04-18 DIAGNOSIS — R2689 Other abnormalities of gait and mobility: Secondary | ICD-10-CM | POA: Diagnosis present

## 2019-04-18 DIAGNOSIS — M6281 Muscle weakness (generalized): Secondary | ICD-10-CM | POA: Insufficient documentation

## 2019-04-18 DIAGNOSIS — R2681 Unsteadiness on feet: Secondary | ICD-10-CM | POA: Diagnosis not present

## 2019-04-18 NOTE — Therapy (Addendum)
[facility] [address] [place], [place], [address] Phone: [phone]   Fax:  [phone]  Physical Therapy Treatment and D/C Summary   Patient Details  Name: Kylie Calhoun MRN: [id number] Date of Birth: 11/03/1950 Referring Provider (PT): William Shaw, MD   Encounter Date: 04/18/2019  PT End of Session - 04/22/19 1020    Visit Number  10    Number of Visits  13    Date for PT Re-Evaluation  05/11/19   POC written for 90 days as she plans to have cataracts removed in Nov.   Authorization Type  [employer] Medicare 10th visit progress note    PT Start Time  0932    PT Stop Time  1015    PT Time Calculation (min)  43 min    Equipment Utilized During Treatment  Gait belt    Activity Tolerance  Patient tolerated treatment well    Behavior During Therapy  WFL for tasks assessed/performed       Past Medical History:  Diagnosis Date  . 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 Ankle fracture, left   . Bladder cancer ([facility]) 2011   3 times  . Diverticulosis    mild per colonoscopy  . Dyslipidemia   . Hemorrhoid    internal per colonoscopy 2016  . Hx of osteopenia   . NPH (normal pressure hydrocephalus) ([facility]) 09/18/2016  . Stroke ([facility])     Past Surgical History:  Procedure Laterality Date  . ABDOMINAL HYSTERECTOMY    . APPENDECTOMY    . BLADDER SURGERY     scope and removal of tumors  . BRAIN SURGERY    . 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 REFRACTIVE SURGERY    . TONSILLECTOMY      There were no vitals filed for this visit.                  PT Short Term Goals - 03/04/19 1021      PT SHORT TERM GOAL #1   Title  Pt will be independent with initial HEP in order to indicate decreased fall risk and improved functional mobility.  (Target Date: 02/24/19 or 5th visit)    Baseline  03/04/19: met with current program after review at last session.    Status   Achieved      PT SHORT TERM GOAL #2   Title  Pt will improve FGA to 19/30 in order to indicate decreased fall risk.    Baseline  05/03/18: 21/30 met today    Status  Achieved      PT SHORT TERM GOAL #3   Title  Pt will perform 6MWT and appropriate LTG to be set in order to indicate improved functional endurance.    Baseline  1149' on 02/14/19    Status  Achieved        PT Long Term Goals - 04/18/19 0951      PT LONG TERM GOAL #1   Title  Pt will be independent with final HEP in order to indicate decreased fall risk and improved functional mobility.  (Target Date: 03/27/19 or 13th visit)    Baseline  Pt reports doing HEP consistently and no questions/concerns    Time  6    Period  Weeks    Status  Achieved      PT LONG TERM GOAL #2   Title  Pt will improve FGA   to >/=23/30 in order to indicate decreased fall risk.    Baseline  04/18/19-23/30    Status  Achieved      PT LONG TERM GOAL #3   Title  Pt will improve gait speed to >/=3.6 ft/sec in order to indicate improved efficiency of gait.    Baseline  04/18/19-4.08 ft/sec no device    Status  Achieved      PT LONG TERM GOAL #4   Title  Pt will ambulate x 1000' over indoor and outdoor paved surfaces while scanning environment without overt LOB in order to indicate safe community negotiation and return to leisure fitness.    Baseline  04/18/19-pt reports walking outdoors 2 miles almost every day    Status  Achieved      PT LONG TERM GOAL #5   Title  Pt will improve 6MWT 150' from baseline in order to indicate improved functional endurance.    Baseline  04/18/19-1358 ft in 6MWT    Status  Achieved              Patient will benefit from skilled therapeutic intervention in order to improve the following deficits and impairments:  Abnormal gait, Decreased activity tolerance, Decreased balance, Decreased coordination, Decreased endurance, Decreased mobility, Decreased strength, Dizziness, Impaired flexibility, Improper body  mechanics  Visit Diagnosis: Unsteadiness on feet  Other abnormalities of gait and mobility  Muscle weakness (generalized)     Problem List Patient Active Problem List   Diagnosis Date Noted  . NPH (normal pressure hydrocephalus) ([place]) 09/18/2016  . Left hand paresthesia 09/18/2016  . Left arm numbness 09/18/2016  . 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 Stroke ([place]) 03/20/2015  . Stroke with cerebral ischemia ([place])   . HLD (hyperlipidemia)   . Unsteady gait 03/19/2015  . Hx of osteopenia   . Bladder cancer ([place])   . Hemorrhoid   . Diverticulosis   . Ischemic stroke ([place])   . Malignant neoplasm of urinary bladder ([place])     Narda Bonds, [place] 04/22/19 10:22 AM Phone: [phone] Fax: [place] [address] [place], [place], [address] Phone: [phone]   Fax:  [phone]  Name: Kylie Calhoun MRN: [id number] Date of Birth: 1951/03/30

## 2019-04-18 NOTE — Patient Instructions (Signed)
It is important to avoid accidents which may result in broken bones.  Here are a few ideas on how to make your home safer so you will be less likely to trip or fall.  1. Use nonskid mats or non slip strips in your shower or tub, on your bathroom floor and around sinks.  If you know that you have spilled water, wipe it up! 2. In the bathroom, it is important to have properly installed grab bars on the walls or on the edge of the tub.  Towel racks are NOT strong enough for you to hold onto or to pull on for support. 3. Stairs and hallways should have enough light.  Add lamps or night lights if you need ore light. 4. It is good to have handrails on both sides of the stairs if possible.  Always fix broken handrails right away. 5. It is important to see the edges of steps.  Paint the edges of outdoor steps white so you can see them better.  Put colored tape on the edge of inside steps. 6. Throw-rugs are dangerous because they can slide.  Removing the rugs is the best idea, but if they must stay, add adhesive carpet tape to prevent slipping. 7. Do not keep things on stairs or in the halls.  Remove small furniture that blocks the halls as it may cause you to trip.  Keep telephone and electrical cords out of the way where you walk. 8. Always were sturdy, rubber-soled shoes for good support.  Never wear just socks, especially on the stairs.  Socks may cause you to slip or fall.  Do not wear full-length housecoats as you can easily trip on the bottom.  9. Place the things you use the most on the shelves that are the easiest to reach.  If you use a stepstool, make sure it is in good condition.  If you feel unsteady, DO NOT climb, ask for help. 10. If a health professional advises you to use a cane or walker, do not be ashamed.  These items can keep you from falling and breaking your bones.   Fall Prevention in the Home, Adult Falls can cause injuries and can affect people from all age groups. There are many  simple things that you can do to make your home safe and to help prevent falls. Ask for help when making these changes, if needed. What actions can I take to prevent falls? General instructions  Use good lighting in all rooms. Replace any light bulbs that burn out.  Turn on lights if it is dark. Use night-lights.  Place frequently used items in easy-to-reach places. Lower the shelves around your home if necessary.  Set up furniture so that there are clear paths around it. Avoid moving your furniture around.  Remove throw rugs and other tripping hazards from the floor.  Avoid walking on wet floors.  Fix any uneven floor surfaces.  Add color or contrast paint or tape to grab bars and handrails in your home. Place contrasting color strips on the first and last steps of stairways.  When you use a stepladder, make sure that it is completely opened and that the sides are firmly locked. Have someone hold the ladder while you are using it. Do not climb a closed stepladder.  Be aware of any and all pets. What can I do in the bathroom?      Keep the floor dry. Immediately clean up any water that spills onto the floor.  Remove soap buildup in the tub or shower on a regular basis.  Use non-skid mats or decals on the floor of the tub or shower.  Attach bath mats securely with double-sided, non-slip rug tape.  If you need to sit down while you are in the shower, use a plastic, non-slip stool.  Install grab bars by the toilet and in the tub and shower. Do not use towel bars as grab bars. What can I do in the bedroom?  Make sure that a bedside light is easy to reach.  Do not use oversized bedding that drapes onto the floor.  Have a firm chair that has side arms to use for getting dressed. What can I do in the kitchen?  Clean up any spills right away.  If you need to reach for something above you, use a sturdy step stool that has a grab bar.  Keep electrical cables out of the  way.  Do not use floor polish or wax that makes floors slippery. If you must use wax, make sure that it is non-skid floor wax. What can I do in the stairways?  Do not leave any items on the stairs.  Make sure that you have a light switch at the top of the stairs and the bottom of the stairs. Have them installed if you do not have them.  Make sure that there are handrails on both sides of the stairs. Fix handrails that are broken or loose. Make sure that handrails are as long as the stairways.  Install non-slip stair treads on all stairs in your home.  Avoid having throw rugs at the top or bottom of stairways, or secure the rugs with carpet tape to prevent them from moving.  Choose a carpet design that does not hide the edge of steps on the stairway.  Check any carpeting to make sure that it is firmly attached to the stairs. Fix any carpet that is loose or worn. What can I do on the outside of my home?  Use bright outdoor lighting.  Regularly repair the edges of walkways and driveways and fix any cracks.  Remove high doorway thresholds.  Trim any shrubbery on the main path into your home.  Regularly check that handrails are securely fastened and in good repair. Both sides of any steps should have handrails.  Install guardrails along the edges of any raised decks or porches.  Clear walkways of debris and clutter, including tools and rocks.  Have leaves, snow, and ice cleared regularly.  Use sand or salt on walkways during winter months.  In the garage, clean up any spills right away, including grease or oil spills. What other actions can I take?  Wear closed-toe shoes that fit well and support your feet. Wear shoes that have rubber soles or low heels.  Use mobility aids as needed, such as canes, walkers, scooters, and crutches.  Review your medicines with your health care provider. Some medicines can cause dizziness or changes in blood pressure, which increase your risk of  falling. Talk with your health care provider about other ways that you can decrease your risk of falls. This may include working with a physical therapist or trainer to improve your strength, balance, and endurance. Where to find more information  Centers for Disease Control and Prevention, STEADI: WebmailGuide.co.za  [employer] on Aging: BrainJudge.co.uk Contact a health care provider if:  You are afraid of falling at home.  You feel weak, drowsy, or dizzy at home.  You  fall at home. Summary  There are many simple things that you can do to make your home safe and to help prevent falls.  Ways to make your home safe include removing tripping hazards and installing grab bars in the bathroom.  Ask for help when making these changes in your home. This information is not intended to replace advice given to you by your health care provider. Make sure you discuss any questions you have with your health care provider. Document Revised: 03/13/2017 Document Reviewed: 11/13/2016 Elsevier Patient Education  2020 [employer].

## 2019-04-19 ENCOUNTER — Other Ambulatory Visit: Payer: Self-pay

## 2019-04-19 DIAGNOSIS — I639 Cerebral infarction, unspecified: Secondary | ICD-10-CM

## 2019-04-19 MED ORDER — CLOPIDOGREL BISULFATE 75 MG PO TABS: ORAL_TABLET | ORAL | 1 refills | Status: DC

## 2019-04-20 ENCOUNTER — Ambulatory Visit: Payer: Medicare Other | Admitting: Neurology

## 2019-04-20 ENCOUNTER — Telehealth: Payer: Self-pay | Admitting: Neurology

## 2019-04-20 NOTE — Telephone Encounter (Signed)
Called patient and LVM requesting she call back to reschedule 1/6 cancelled appt.

## 2019-04-28 ENCOUNTER — Other Ambulatory Visit: Payer: Self-pay

## 2019-04-28 ENCOUNTER — Encounter: Payer: Self-pay | Admitting: Neurology

## 2019-04-28 ENCOUNTER — Ambulatory Visit: Payer: Medicare Other | Admitting: Neurology

## 2019-04-28 VITALS — BP 132/72 | HR 80 | Temp 97.4°F | Ht 63.0 in | Wt 135.0 lb

## 2019-04-28 DIAGNOSIS — R269 Unspecified abnormalities of gait and mobility: Secondary | ICD-10-CM

## 2019-04-28 DIAGNOSIS — G912 (Idiopathic) normal pressure hydrocephalus: Secondary | ICD-10-CM

## 2019-04-28 NOTE — Progress Notes (Signed)
Reason for visit: Normal pressure hydrocephalus, gait disorder  Kylie Calhoun is an 69 y.o. female  History of present illness:  Kylie Calhoun is a 69 year old right-handed white female with a history of normal pressure hydrocephalus, status post VP shunt done by Dr. Johnney Killian.  The patient has remained stable neurologically.  She is actively engaged in physical therapy, she does exercises at home to work on balance.  She will be undergoing tympanoplasty on the right in the next several weeks by Dr. Redmond Baseman to repair perforations in her tympanic membrane.  The patient overall feels that she is doing well, she usually does not use a cane or a walking stick.  She will ambulate outside of the house on a regular basis for exercise.  She has not had any falls.  Past Medical History:  Diagnosis Date  . 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 Ankle fracture, left   . Bladder cancer (Kylie Calhoun) 2011   3 times  . Diverticulosis    mild per colonoscopy  . Dyslipidemia   . Hemorrhoid    internal per colonoscopy 2016  . Hx of osteopenia   . NPH (normal pressure hydrocephalus) (Kylie Calhoun) 09/18/2016  . Stroke Kylie Calhoun)     Past Surgical History:  Procedure Laterality Date  . ABDOMINAL HYSTERECTOMY    . APPENDECTOMY    . BLADDER SURGERY     scope and removal of tumors  . BRAIN SURGERY    . 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 REFRACTIVE SURGERY    . TONSILLECTOMY      Family History  Problem Relation Age of Onset  . Heart disease Father   . Stroke Sister   . Hypertension Sister   . Stroke Brother   . Hypertension Brother     Social history:  reports that she has never smoked. She has never used smokeless tobacco. She reports current alcohol use. She reports that she does not use drugs.   No Known Allergies  Medications:  Prior to Admission medications   Medication Sig Start Date End Date Taking? Authorizing Provider  atorvastatin (LIPITOR) 20 MG tablet  Take 20 mg by mouth daily at 6 PM.  03/21/15  Yes [provider]  Cholecalciferol (VITAMIN D-3) 25 MCG (1000 UT) CAPS Take 2,000-3,000 Units by mouth daily with breakfast.   Yes [provider]  clopidogrel (PLAVIX) 75 MG tablet Take 1 tablet (75 MG) by mouth daily 04/19/19  Yes Kathrynn Ducking, MD  Melatonin 10 MG TABS Take 10 mg by mouth at bedtime as needed (for sleep). typically cuts is half   Yes [provider]  Multiple Vitamin (MULTIVITAMIN WITH MINERALS) TABS tablet Take 1 tablet by mouth daily.   Yes [provider]    ROS:  Out of a complete 14 system review of symptoms, the patient complains only of the following symptoms, and all other reviewed systems are negative.  Walking difficulty Hearing problems, dizziness  Blood pressure 132/72, pulse 80, temperature (!) 97.4 F (36.3 C), height 5\' 3"  (1.6 m), weight 135 lb (61.2 kg).  Physical Exam  General: The patient is alert and cooperative at the time of the examination.  Skin: No significant peripheral edema is noted.   Neurologic Exam  Mental status: The patient is alert and oriented x 3 at the time of the examination. The patient has apparent normal recent and remote memory, with an apparently normal attention span  and concentration ability.   Cranial nerves: Facial symmetry is present. Speech is normal, no aphasia or dysarthria is noted. Extraocular movements are full. Visual fields are full.  Motor: The patient has good strength in all 4 extremities.  Sensory examination: Soft touch sensation is symmetric on the face, arms, and legs.  Coordination: The patient has good finger-nose-finger and heel-to-shin bilaterally.  Gait and station: The patient has a wide-based gait, she is able to walk independently.  Tandem gait is slightly unsteady.  Romberg is negative but is slightly unsteady.  Reflexes: Deep tendon reflexes are symmetric.   Assessment/Plan:  1.  Normal pressure  hydrocephalus  2.  Gait disorder  The patient is neurologically stable at this time.  She will follow-up here in 1 year, sooner if needed.  She will contact me if any new issues are noted.  Greater than 50% of the visit was spent in counseling and coordination of care.  Face-to-face time with the patient was 15 minutes.   Kylie Alexanders MD 04/28/2019 10:21 AM  [facility] [address] [place], [place] [address]  Phone [phone] Fax [phone]

## 2019-05-13 ENCOUNTER — Ambulatory Visit: Payer: Medicare Other

## 2019-05-30 ENCOUNTER — Ambulatory Visit: Payer: Medicare Other

## 2019-08-31 DIAGNOSIS — H903 Sensorineural hearing loss, bilateral: Secondary | ICD-10-CM | POA: Insufficient documentation

## 2019-10-07 IMAGING — CT CT HEAD WITHOUT CONTRAST
4 series · 16 of 47 positions shown, 18 images · non-contrast
Comparison: CT head without contrast 09/15/2016

CLINICAL DATA: New onset of dizziness. Altered level of
consciousness.

EXAM:
CT HEAD WITHOUT CONTRAST
TECHNIQUE: Contiguous axial images were obtained from the base of the skull
through the vertex without intravenous contrast.

[Series 3: head without sag · sagittal · non-contrast · 0.30mm/px · 3 of 67 slices shown]
[im 23/67  brain]
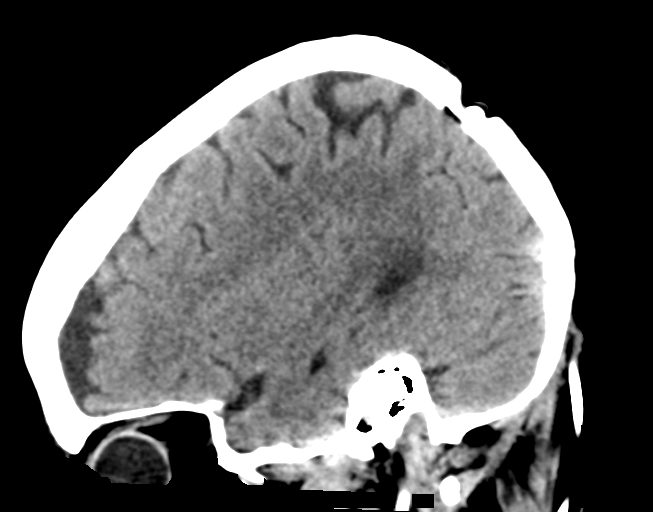
[im 34/67  brain]
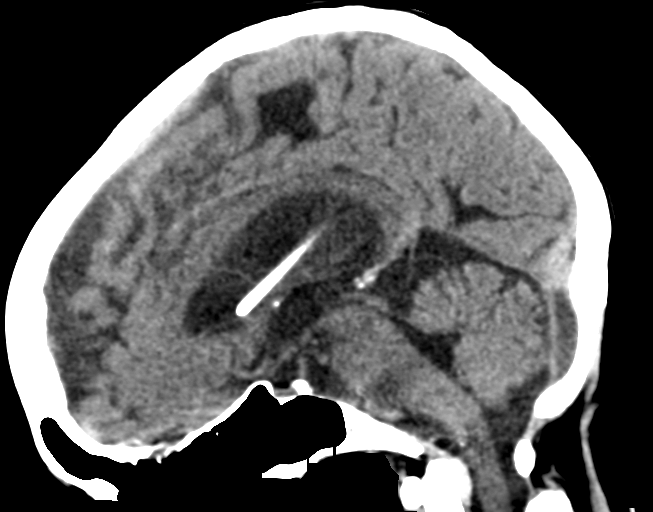
[im 45/67  brain]
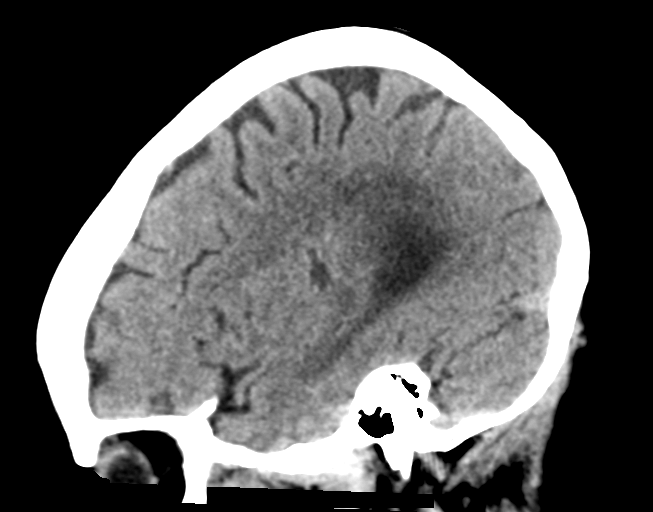

[Series 4: head without cor · coronal · non-contrast · 0.30mm/px · 3 of 67 slices shown]
[im 23/67  brain]
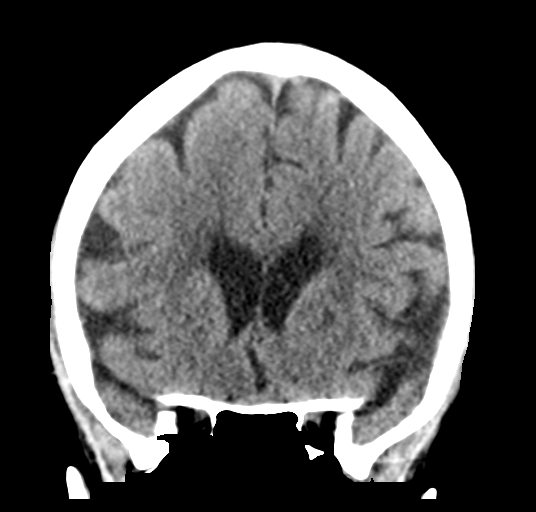
[im 30/67  brain]
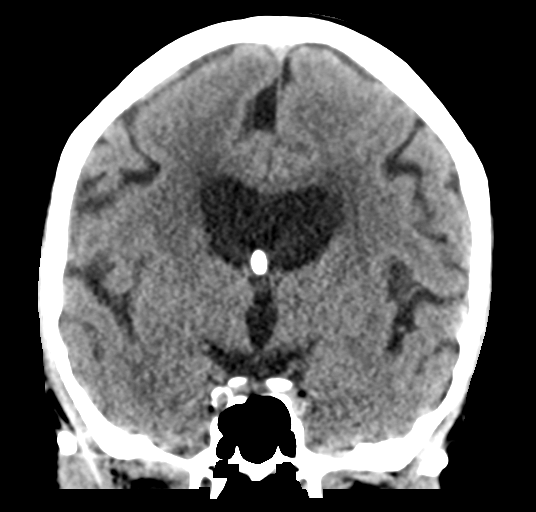
[im 37/67  brain]
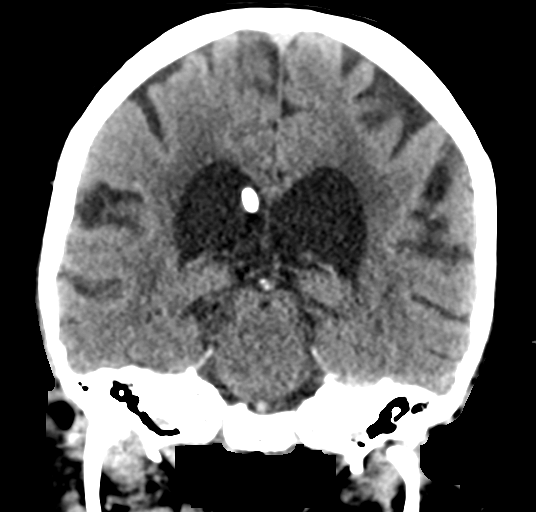

[Series 5: head bone · axial · 0.39mm/px · z∈[-37,-7]mm · 3 of 76 slices shown]
[im 8/76  bone]
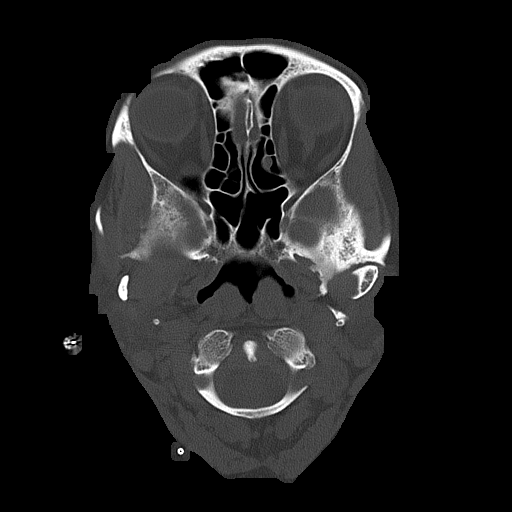
[im 16/76  bone]
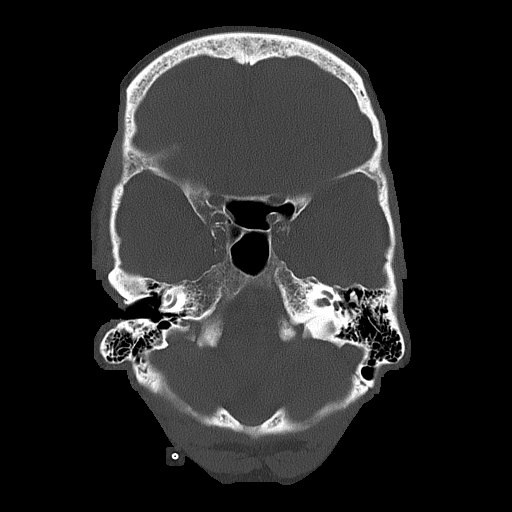
[im 23/76  bone]
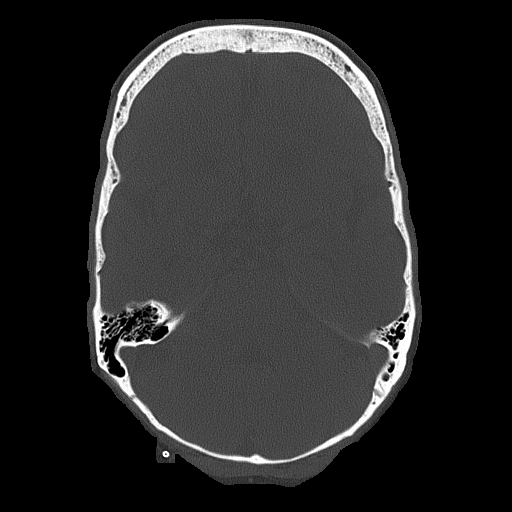

[Series 6: head without (person_name) · axial · non-contrast · 0.39mm/px · z∈[-36,+79]mm · 7 of 31 slices shown, 9 images]
[im 4/31  brain]
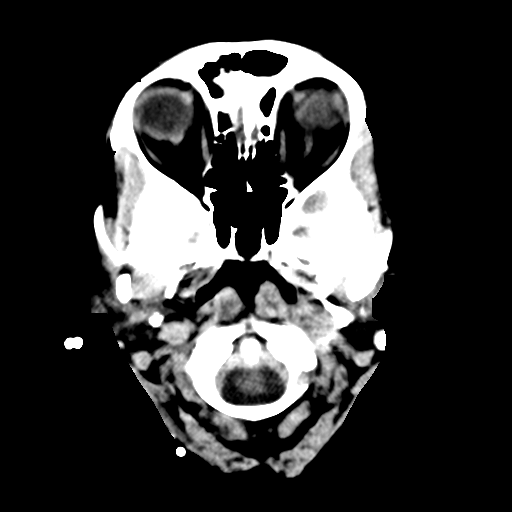
[im 4/31  bone]
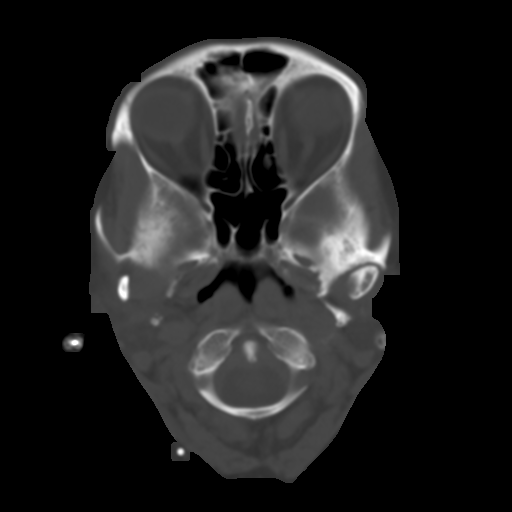
[im 8/31  brain]
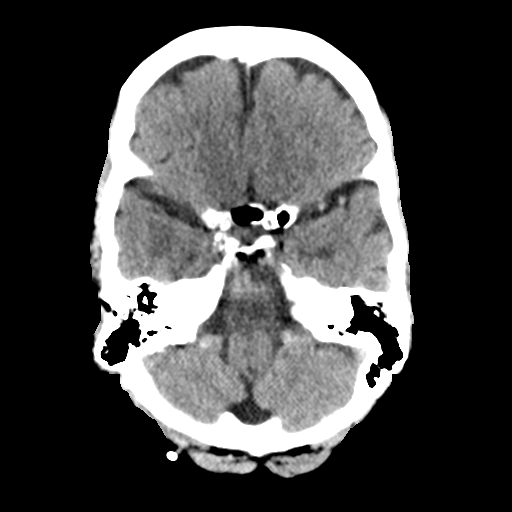
[im 12/31  brain]
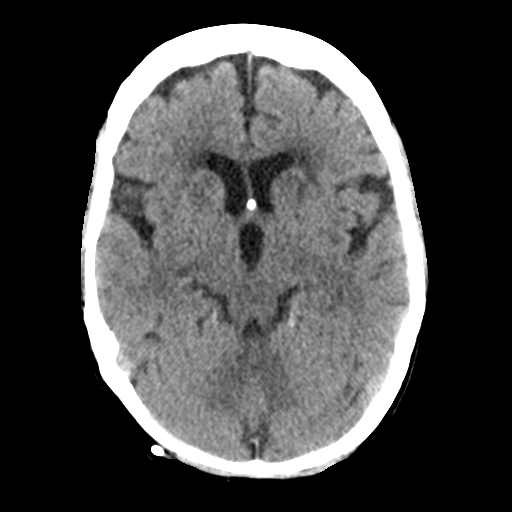
[im 16/31  brain]
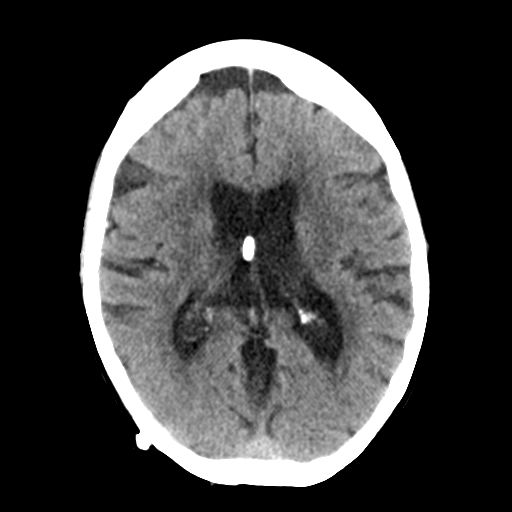
[im 19/31  brain]
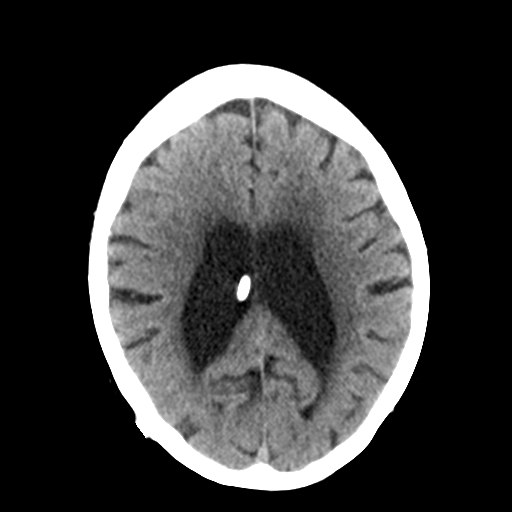
[im 19/31  bone]
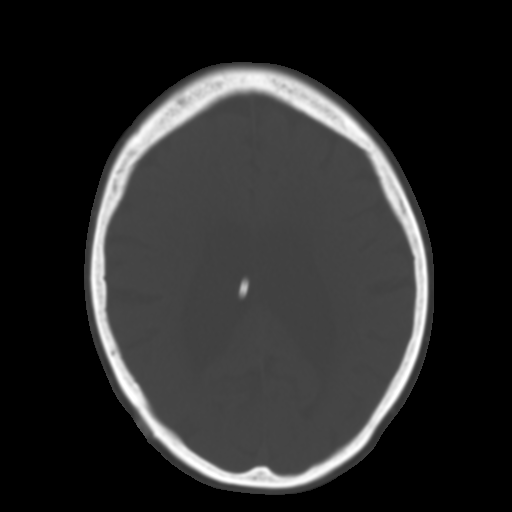
[im 23/31  brain]
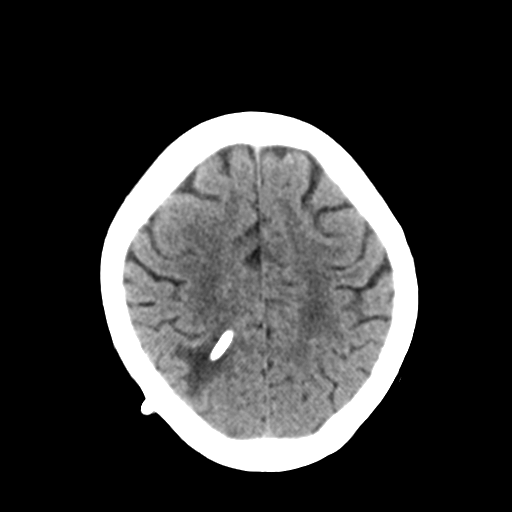
[im 27/31  brain]
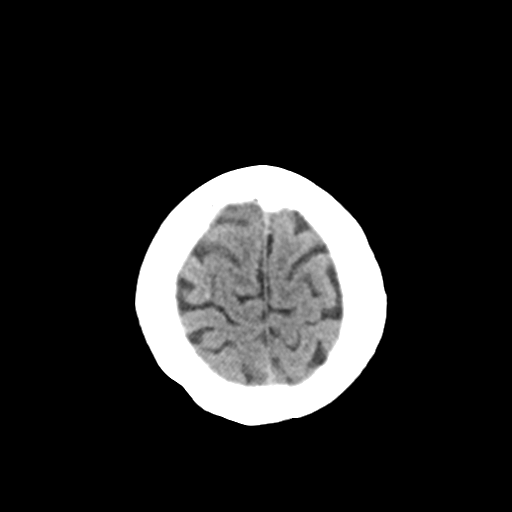

[16 of 47 positions shown; findings below may reference images not displayed]

FINDINGS: Brain: Right parietal ventriculostomy catheter is stable in position
near the septum flu syndrome. Ventricular size is stable. There is
no hemorrhage. Mild white matter hypoattenuation about the proximal
catheter is stable.

Mild periventricular white matter changes are noted bilaterally. No
significant extra-axial fluid collection is present.

Vascular: Atherosclerotic calcifications present in the cavernous
internal carotid arteries bilaterally. There is no hyperdense
vessel. Additional calcifications are present at the dural margin of
both vertebral arteries.

Skull: Right parietal burr hole is in place. Programmable shunt
catheter is noted. Calvarium is otherwise intact. No focal lytic or
blastic lesions are present.

Sinuses/Orbits: The paranasal sinuses and mastoid air cells are
clear. The globes and orbits are within normal limits.
IMPRESSION: 1. No acute intracranial abnormality or significant interval change.
2. Stable right parietal ventriculostomy shunt.
3. Atherosclerosis.

## 2019-10-07 IMAGING — DX ABDOMEN - 1 VIEW
1 series · 1 of 1 positions shown · non-contrast
Comparison: None.

CLINICAL DATA: Evaluate for shunt malfunction. Hx of normal
pressure hydrocephalus, status post shunt placement 7042 at [facility],
presents today complaining of some lightheadedness. She reports that
she has had a UTI diagnosed 1 week ago and has been taking Bactrim.
She reports that every morning she feels somewhat dizzy. This
morning she felt more lightheaded. She also had some pain in the
right side of her neck which has since resolved

EXAM:
ABDOMEN - 1 VIEW

[abdomen kub]
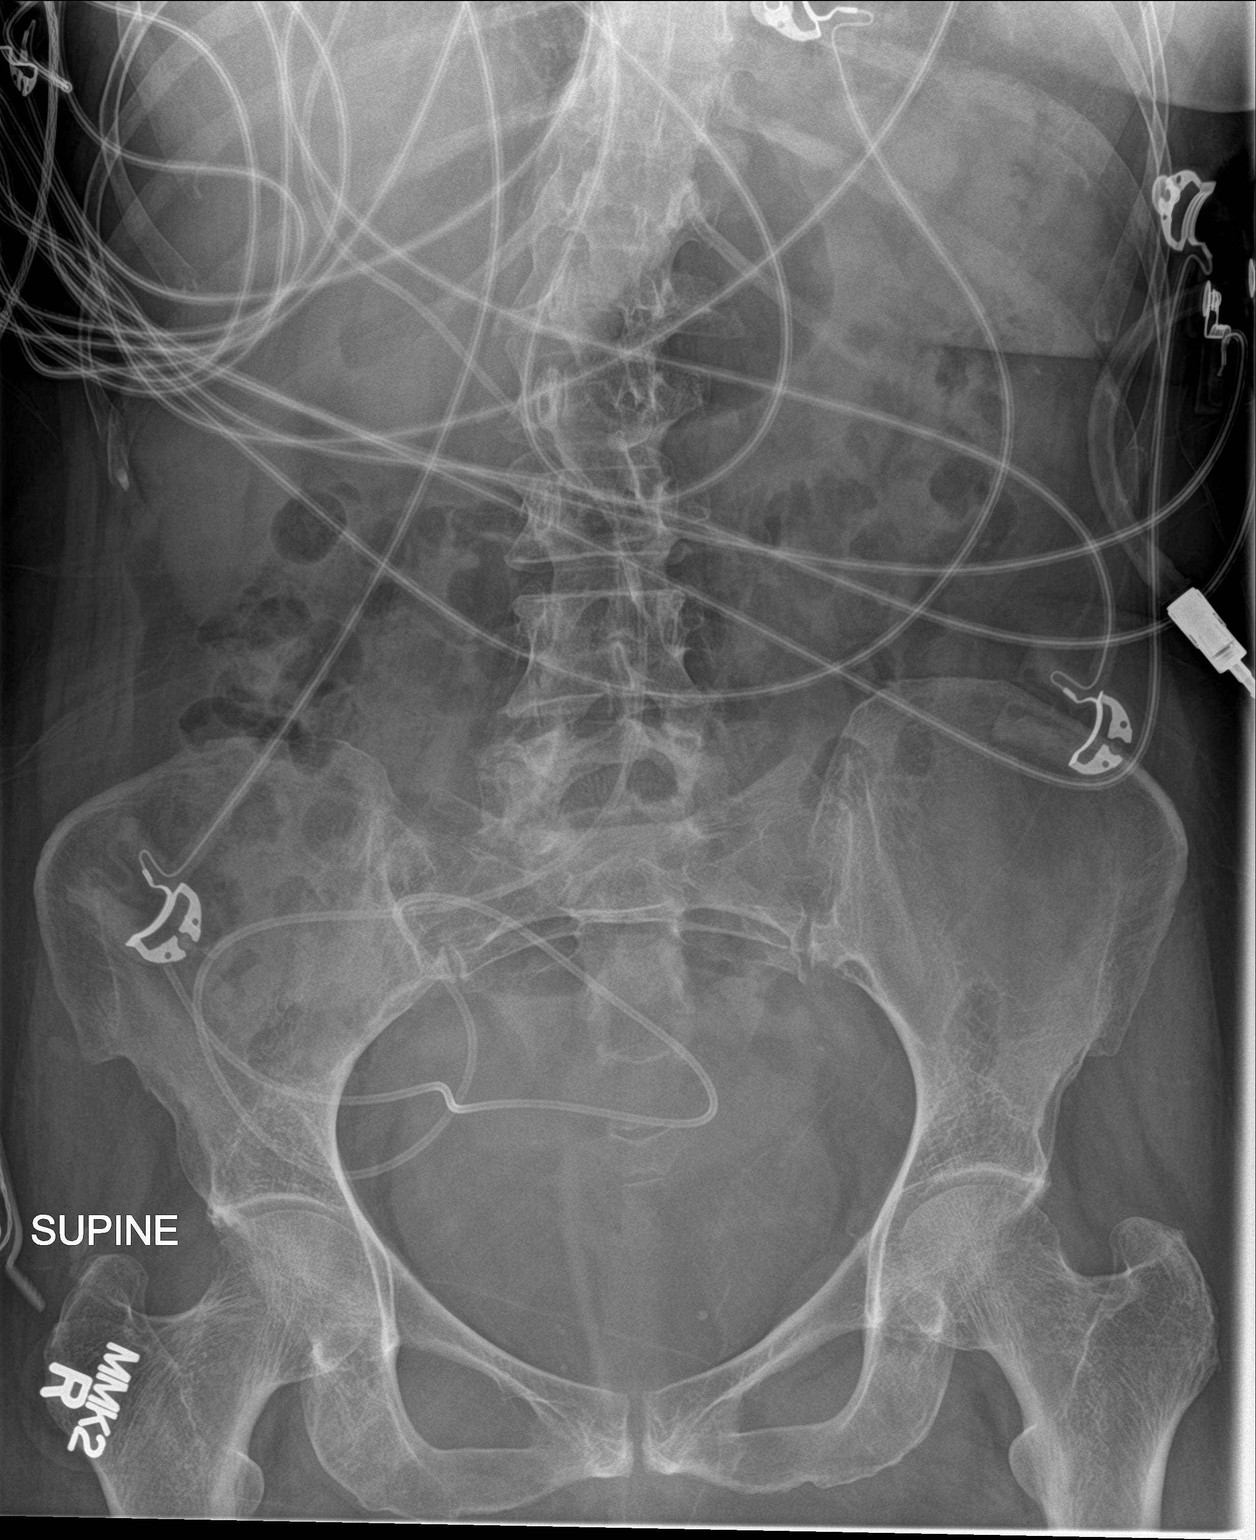

[1 of 1 positions shown; findings below may reference images not displayed]

FINDINGS: The ventriculoperitoneal shunt extends from the right anterior chest
to curl within the right pelvis. It is intact.

Normal bowel gas pattern. No evidence of renal or ureteral stones.
Soft tissues otherwise unremarkable.

No acute skeletal abnormality.
IMPRESSION: 1. Intact ventriculoperitoneal shunt catheter from the right upper
quadrant to its termination in the right lower quadrant.
2. No acute findings.

## 2019-10-11 ENCOUNTER — Encounter (HOSPITAL_COMMUNITY): Payer: Self-pay | Admitting: *Deleted

## 2019-10-11 ENCOUNTER — Emergency Department (HOSPITAL_COMMUNITY)
Admission: EM | Admit: 2019-10-11 | Discharge: 2019-10-11 | Disposition: A | Discharge status: Home / Self Care | Payer: Medicare Other | Attending: Emergency Medicine | Admitting: Emergency Medicine

## 2019-10-11 ENCOUNTER — Emergency Department (HOSPITAL_COMMUNITY): Payer: Medicare Other

## 2019-10-11 ENCOUNTER — Other Ambulatory Visit (HOSPITAL_COMMUNITY): Payer: Self-pay | Admitting: Radiology

## 2019-10-11 ENCOUNTER — Other Ambulatory Visit: Payer: Self-pay

## 2019-10-11 DIAGNOSIS — Z7901 Long term (current) use of anticoagulants: Secondary | ICD-10-CM | POA: Insufficient documentation

## 2019-10-11 DIAGNOSIS — Z7902 Long term (current) use of antithrombotics/antiplatelets: Secondary | ICD-10-CM | POA: Insufficient documentation

## 2019-10-11 DIAGNOSIS — R202 Paresthesia of skin: Secondary | ICD-10-CM | POA: Diagnosis not present

## 2019-10-11 DIAGNOSIS — Z8673 Personal history of transient ischemic attack (TIA), and cerebral infarction without residual deficits: Secondary | ICD-10-CM | POA: Insufficient documentation

## 2019-10-11 DIAGNOSIS — Z8551 Personal history of malignant neoplasm of bladder: Secondary | ICD-10-CM | POA: Diagnosis not present

## 2019-10-11 LAB — CBC
HCT: 45.1 % (ref 36.0–46.0)
Hemoglobin: 14.5 g/dL (ref 12.0–15.0)
MCH: 29.6 pg (ref 26.0–34.0)
MCHC: 32.2 g/dL (ref 30.0–36.0)
MCV: 92 fL (ref 80.0–100.0)
Platelets: 254 10*3/uL (ref 150–400)
RBC: 4.9 MIL/uL (ref 3.87–5.11)
RDW: 12.6 % (ref 11.5–15.5)
WBC: 6 10*3/uL (ref 4.0–10.5)
nRBC: 0 % (ref 0.0–0.2)

## 2019-10-11 LAB — COMPREHENSIVE METABOLIC PANEL
ALT: 21 U/L (ref 0–44)
AST: 23 U/L (ref 15–41)
Albumin: 4.4 g/dL (ref 3.5–5.0)
Alkaline Phosphatase: 53 U/L (ref 38–126)
Anion gap: 9 (ref 5–15)
BUN: 8 mg/dL (ref 8–23)
CO2: 23 mmol/L (ref 22–32)
Calcium: 9.1 mg/dL (ref 8.9–10.3)
Chloride: 112 mmol/L — ABNORMAL HIGH (ref 98–111)
Creatinine, Ser: 0.93 mg/dL (ref 0.44–1.00)
GFR calc Af Amer: >60 mL/min (ref >60)
GFR calc non Af Amer: >60 mL/min (ref >60)
Glucose, Bld: 109 mg/dL — ABNORMAL HIGH (ref 70–99)
Potassium: 3.7 mmol/L (ref 3.5–5.1)
Sodium: 144 mmol/L (ref 135–145)
Total Bilirubin: 2.1 mg/dL — ABNORMAL HIGH (ref 0.3–1.2)
Total Protein: 6.7 g/dL (ref 6.5–8.1)

## 2019-10-11 LAB — DIFFERENTIAL
Abs Immature Granulocytes: 0.02 10*3/uL (ref 0.00–0.07)
Basophils Absolute: 0 10*3/uL (ref 0.0–0.1)
Basophils Relative: 1 %
Eosinophils Absolute: 0 10*3/uL (ref 0.0–0.5)
Eosinophils Relative: 1 %
Immature Granulocytes: 0 %
Lymphocytes Relative: 35 %
Lymphs Abs: 2.1 10*3/uL (ref 0.7–4.0)
Monocytes Absolute: 0.5 10*3/uL (ref 0.1–1.0)
Monocytes Relative: 8 %
Neutro Abs: 3.4 10*3/uL (ref 1.7–7.7)
Neutrophils Relative %: 55 %

## 2019-10-11 LAB — PROTIME-INR
INR: 1 (ref 0.8–1.2)
Prothrombin Time: 12.9 seconds (ref 11.4–15.2)

## 2019-10-11 LAB — I-STAT CHEM 8, ED
BUN: 9 mg/dL (ref 8–23)
Calcium, Ion: 1 mmol/L — ABNORMAL LOW (ref 1.15–1.40)
Chloride: 110 mmol/L (ref 98–111)
Creatinine, Ser: 0.8 mg/dL (ref 0.44–1.00)
Glucose, Bld: 105 mg/dL — ABNORMAL HIGH (ref 70–99)
HCT: 45 % (ref 36.0–46.0)
Hemoglobin: 15.3 g/dL — ABNORMAL HIGH (ref 12.0–15.0)
Potassium: 3.6 mmol/L (ref 3.5–5.1)
Sodium: 144 mmol/L (ref 135–145)
TCO2: 21 mmol/L — ABNORMAL LOW (ref 22–32)

## 2019-10-11 LAB — APTT: aPTT: 32 seconds (ref 24–36)

## 2019-10-11 MED ORDER — SODIUM CHLORIDE 0.9% FLUSH: 3.0000 mL | Freq: Once | INTRAVENOUS | Status: DC

## 2019-10-11 NOTE — ED Provider Notes (Signed)
[facility] EMERGENCY DEPARTMENT Provider Note   CSN: [id number] Arrival date & time: 10/11/19  1014     History Chief Complaint  Patient presents with  . Numbness    Kylie Calhoun is a 69 y.o. female.  The history is provided by the patient and medical records. No language interpreter was used.   Kylie Calhoun is a 69 y.o. female who presents to the Emergency Department complaining of numbness. She reports two days of intermittent paresthesias and numbness to her upper lip as well as bilateral hands. Episodes are waxing and waning with no clear alleviating or worsening factors. She states that her hand episodes are briefer than her lip episodes. No associated symptoms. She denies any headache, vision changes, fevers, nausea, vomiting, shortness of breath, chest pain, abdominal pain, weakness. No prior similar symptoms. She does have a history of CVA and is on anticoagulation. She also has a history of VP shunt and had [facility] about one [facility]. She lives alone.    Past Medical History:  Diagnosis Date  . 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 Ankle fracture, left   . Bladder cancer ([place]) 2011   3 times  . Diverticulosis    mild per colonoscopy  . Dyslipidemia   . Hemorrhoid    internal per colonoscopy 2016  . Hx of osteopenia   . NPH (normal pressure hydrocephalus) ([place]) 09/18/2016  . Stroke [facility])     Patient Active Problem List   Diagnosis Date Noted  . NPH (normal pressure hydrocephalus) ([place]) 09/18/2016  . Left hand paresthesia 09/18/2016  . Left arm numbness 09/18/2016  . 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 Stroke ([place]) 03/20/2015  . Stroke with cerebral ischemia ([place])   . HLD (hyperlipidemia)   . Gait abnormality 03/19/2015  . Hx of osteopenia   . Bladder cancer ([employer])   . Hemorrhoid   . Diverticulosis   . Ischemic stroke ([place])   . Malignant neoplasm of urinary bladder [facility])      Past Surgical History:  Procedure Laterality Date  . ABDOMINAL HYSTERECTOMY    . APPENDECTOMY    . BLADDER SURGERY     scope and removal of tumors  . BRAIN SURGERY    . 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 REFRACTIVE SURGERY    . TONSILLECTOMY       OB History    Gravida  3   Para      Term      Preterm      AB      Living        SAB      TAB      Ectopic      Multiple      Live Births              Family History  Problem Relation Age of Onset  . Heart disease Father   . Stroke Sister   . Hypertension Sister   . Stroke Brother   . Hypertension Brother     Social History   Tobacco Use  . Smoking status: Never Smoker  . Smokeless tobacco: Never Used  Substance Use Topics  . Alcohol use: Yes    Comment: Occasional   . Drug use: No    Home Medications Prior to Admission medications   Medication Sig Start Date End Date Taking? Authorizing Provider  atorvastatin (LIPITOR) 20 MG tablet Take 20 mg by mouth daily at 6 PM.  03/21/15   [provider]  Cholecalciferol (VITAMIN D-3) 25 MCG (1000 UT) CAPS Take 2,000-3,000 Units by mouth daily with breakfast.    [provider]  clopidogrel (PLAVIX) 75 MG tablet Take 1 tablet (75 MG) by mouth daily 04/19/19   Kathrynn Ducking, MD  Melatonin 10 MG TABS Take 10 mg by mouth at bedtime as needed (for sleep). typically cuts is half    [provider]  Multiple Vitamin (MULTIVITAMIN WITH MINERALS) TABS tablet Take 1 tablet by mouth daily.    [provider]    Allergies    Patient has no known allergies.  Review of Systems   Review of Systems  All other systems reviewed and are negative.   Physical Exam Updated Vital Signs BP 135/62 (BP Location: Right Arm)   Pulse 73   Temp 98 F (36.7 C) (Oral)   Resp 15   Ht 5\' 3"  (1.6 m)   Wt 61.2 kg   SpO2 100%   BMI 23.91 kg/m   Physical Exam Vitals and nursing note reviewed.  Constitutional:       Appearance: She is well-developed.  HENT:     Head: Normocephalic and atraumatic.  Cardiovascular:     Rate and Rhythm: Normal rate and regular rhythm.     Heart sounds: No murmur heard.   Pulmonary:     Effort: Pulmonary effort is normal. No respiratory distress.     Breath sounds: Normal breath sounds.  Abdominal:     Palpations: Abdomen is soft.     Tenderness: There is no abdominal tenderness. There is no guarding or rebound.  Musculoskeletal:        General: No tenderness.  Skin:    General: Skin is warm and dry.  Neurological:     Mental Status: She is alert and oriented to person, place, and time.     Comments: Visual fields grossly intact.  No asymmetry of facial movements.  5/5 strength in all four extremities.  Sensation intact in all four extremities.  No pronator drift.    Psychiatric:        Behavior: Behavior normal.     ED Results / Procedures / Treatments   Labs (all labs ordered are listed, but only abnormal results are displayed) Labs Reviewed  COMPREHENSIVE METABOLIC PANEL - Abnormal; Notable for the following components:      Result Value   Chloride 112 (*)    Glucose, Bld 109 (*)    Total Bilirubin 2.1 (*)    All other components within normal limits  I-STAT CHEM 8, ED - Abnormal; Notable for the following components:   Glucose, Bld 105 (*)    Calcium, Ion 1.00 (*)    TCO2 21 (*)    Hemoglobin 15.3 (*)    All other components within normal limits  PROTIME-INR  APTT  CBC  DIFFERENTIAL  CBG MONITORING, ED    EKG None  Radiology CT HEAD WO CONTRAST  Result Date: 10/11/2019 CLINICAL DATA:  Neuro deficits. Possible subacute stroke. Mouth numbness with bilateral hand and finger tingling for 2 days. History of stroke. EXAM: CT HEAD WITHOUT CONTRAST TECHNIQUE: Contiguous axial images were obtained from the base of the skull through the vertex without intravenous contrast. COMPARISON:  CT head 09/26/2019 and 09/15/2016. FINDINGS: Brain: There is  no evidence of acute intracranial hemorrhage, mass lesion, brain edema or extra-axial fluid collection. Right posterior  parietal ventriculostomy catheter is unchanged in position, terminating near the septum flu system anteriorly. There is stable mild ventriculomegaly. There are stable chronic small vessel ischemic changes in the periventricular white matter and encephalomalacia around the peripheral aspect of the VP shunt. There is no CT evidence of acute cortical infarction. Vascular: Intracranial vascular calcifications. No hyperdense vessel identified. Skull: Negative for fracture or focal lesion. Sinuses/Orbits: Stable small mucous retention cyst in the posterior left ethmoid air cells. The additional visualized paranasal sinuses, mastoid air cells and middle ears are clear. No significant orbital findings. Previous bilateral lens surgery. Other: None. IMPRESSION: 1. No acute intracranial findings. 2. Stable chronic small vessel ischemic changes and encephalomalacia around the peripheral aspect of the VP shunt. Stable mild ventriculomegaly. Electronically Signed   By: Richardean Sale M.D.   On: 10/11/2019 12:48    Procedures Procedures (including critical care time)  Medications Ordered in ED Medications  sodium chloride flush (NS) 0.9 % injection 3 mL (0 mLs Intravenous Hold 10/11/19 1555)    ED Course  I have reviewed the triage vital signs and the nursing notes.  Pertinent labs & imaging results that were available during my care of the patient were reviewed by me and considered in my medical decision making (see chart for details).    MDM Rules/Calculators/A&P                         Patient with history of CVA, VP shunt here for evaluation of paresthesias that are waxing and waning to lips, hands. She is neurologically intact on evaluation in the emergency department. CT without acute abnormality. Presentation is not c/w acute CVA/TIA.  Feel she is stable for d/c home with outpatient  follow up and return precautions.   Final Clinical Impression(s) / ED Diagnoses Final diagnoses:  Paresthesia    Rx / DC Orders ED Discharge Orders    None       Quintella Reichert, MD 10/11/19 1745

## 2019-10-11 NOTE — ED Triage Notes (Signed)
Pt reports not feeling well and having numbness/tingling to her lips and bilateral hands that is intermittent since yesterday. No neuro deficits are noted at triage.

## 2019-10-11 NOTE — Progress Notes (Signed)
Patient has programmable shunt and discussed with Dr Ralene Bathe.  Waiting on neurology to get back with her about reprogramming shunt after MRI.

## 2019-10-16 ENCOUNTER — Other Ambulatory Visit: Payer: Self-pay | Admitting: Neurology

## 2019-10-16 DIAGNOSIS — I639 Cerebral infarction, unspecified: Secondary | ICD-10-CM

## 2020-01-11 ENCOUNTER — Encounter: Payer: Self-pay | Admitting: Podiatry

## 2020-01-11 ENCOUNTER — Ambulatory Visit: Payer: Self-pay

## 2020-01-11 ENCOUNTER — Ambulatory Visit (INDEPENDENT_AMBULATORY_CARE_PROVIDER_SITE_OTHER): Payer: Medicare Other

## 2020-01-11 ENCOUNTER — Ambulatory Visit: Payer: Medicare Other | Admitting: Podiatry

## 2020-01-11 ENCOUNTER — Other Ambulatory Visit: Payer: Self-pay

## 2020-01-11 DIAGNOSIS — M722 Plantar fascial fibromatosis: Secondary | ICD-10-CM

## 2020-01-11 DIAGNOSIS — R52 Pain, unspecified: Secondary | ICD-10-CM

## 2020-01-11 NOTE — Patient Instructions (Signed)

## 2020-01-11 NOTE — Progress Notes (Signed)
Subjective:   Patient ID: Kylie Calhoun, female   DOB: 69 y.o.   MRN: [id number]   HPI Patient presents with exquisite discomfort plantar aspect right heel of 3 months duration.  She tries to be active and this is made it hard and she is tried over-the-counter insoles.  Patient does not smoke likes to be active   Review of Systems  All other systems reviewed and are negative.       Objective:  Physical Exam Vitals and nursing note reviewed.  Constitutional:      Appearance: She is well-developed.  Pulmonary:     Effort: Pulmonary effort is normal.  Musculoskeletal:        General: Normal range of motion.  Skin:    General: Skin is warm.  Neurological:     Mental Status: She is alert.     Neurovascular status intact muscle strength was found to be adequate range of motion within normal limits.  Patient does have a shunt for pressure in her brain and does have some change in gait and does have exquisite discomfort in the plantar fascia the insertion of the tendon into the calcaneus and does have a narrow heel with a diminished fat pad     Assessment:  Appears to be acute plantar fasciitis right with some of the problem may be due to the type of heel that she has with a narrow heel some of it may be due also to the change in gait after having the shunt application     Plan:  H&P reviewed condition and x-ray and today I did sterile prep and injected the fascia 3 mg Dexasone Kenalog 5 mg Xylocaine applied fascial brace to lift up the arch and instructed on shoe gear modifications elevated heel and discussed possible orthotics if symptoms persist  X-rays indicate spur formation bilaterally no indications of stress fracture or arthritis

## 2020-01-25 ENCOUNTER — Encounter: Payer: Self-pay | Admitting: Podiatry

## 2020-01-25 ENCOUNTER — Other Ambulatory Visit: Payer: Self-pay

## 2020-01-25 ENCOUNTER — Ambulatory Visit: Payer: Medicare Other | Admitting: Podiatry

## 2020-01-25 DIAGNOSIS — M722 Plantar fascial fibromatosis: Secondary | ICD-10-CM | POA: Diagnosis not present

## 2020-01-25 NOTE — Progress Notes (Signed)
Subjective:   Patient ID: Kylie Calhoun, female   DOB: 69 y.o.   MRN: [id number]   HPI Patient states that the injection has helped helped but there still is some discomfort in the plantar heel right she is getting ready to go out of town   ROS      Objective:  Physical Exam  Vascular status intact with 1 area of discomfort still noted plantar fascial right at the insertional point tendon calcaneus     Assessment:  Acute plantar fasciitis improving right with one area present     Plan:  H&P reviewed condition sterile prep done and injected the fascia 3 mg Kenalog 5 mg Xylocaine advised on stretch support and reappoint to recheck

## 2020-04-13 ENCOUNTER — Other Ambulatory Visit: Payer: Self-pay | Admitting: Neurology

## 2020-04-13 DIAGNOSIS — I639 Cerebral infarction, unspecified: Secondary | ICD-10-CM

## 2020-04-30 ENCOUNTER — Ambulatory Visit: Payer: Medicare Other | Admitting: Neurology

## 2020-05-07 ENCOUNTER — Encounter: Payer: Self-pay | Admitting: Neurology

## 2020-05-07 ENCOUNTER — Ambulatory Visit: Payer: Medicare Other | Admitting: Neurology

## 2020-05-07 VITALS — BP 136/66 | HR 73 | Ht 63.0 in | Wt 135.0 lb

## 2020-05-07 DIAGNOSIS — G912 (Idiopathic) normal pressure hydrocephalus: Secondary | ICD-10-CM

## 2020-05-07 DIAGNOSIS — R269 Unspecified abnormalities of gait and mobility: Secondary | ICD-10-CM

## 2020-05-07 NOTE — Progress Notes (Signed)
I have read the note, and I agree with the clinical assessment and plan.  Jaclyn Carew K Leinaala Catanese   

## 2020-05-07 NOTE — Patient Instructions (Signed)
Sounds like you are doing great! Keep up the good work  See you back in 1 year

## 2020-05-07 NOTE — Progress Notes (Signed)
PATIENT: Kylie Calhoun DOB: Sep 14, 1950  REASON FOR VISIT: follow up HISTORY FROM: patient  HISTORY OF PRESENT ILLNESS: Today 05/07/20 Synia Calhoun is a 70 year old female history of normal pressure hydrocephalus, status post VP shunt 2018. She continues to do well, denies any falls, or trouble with the balance.  Does not often use a cane, has 1 if needed.  She remains engaged in exercise, has joined a [employer], walks outside as much as the weather will allow.  She lives alone, drives a car.  She is social, has learned to play chess.  She follows at [facility] for her shunt, was having some dizziness back in the summer, the shunt was reprogrammed to a lower setting 70 mmHg.  She remains on Plavix.  She went to the ER in June 2021, reporting numbness.  CT head showed no acute abnormalities. Problem resolved.  She indicates she is overall feeling quite well.  Here today for evaluation unaccompanied.  HISTORY 04/28/2019 Dr. Jannifer Franklin: Ms. Kylie Calhoun is a 70 year old right-handed white female with a history of normal pressure hydrocephalus, status post VP shunt done by Dr. Johnney Killian.  The patient has remained stable neurologically.  She is actively engaged in physical therapy, she does exercises at home to work on balance.  She will be undergoing tympanoplasty on the right in the next several weeks by Dr. Redmond Baseman to repair perforations in her tympanic membrane.  The patient overall feels that she is doing well, she usually does not use a cane or a walking stick.  She will ambulate outside of the house on a regular basis for exercise.  She has not had any falls.   REVIEW OF SYSTEMS: Out of a complete 14 system review of symptoms, the patient complains only of the following symptoms, and all other reviewed systems are negative.  n/a  ALLERGIES: No Known Allergies  HOME MEDICATIONS: Outpatient Medications Prior to Visit  Medication Sig Dispense Refill  . atorvastatin (LIPITOR) 20 MG tablet Take 20 mg by mouth  daily at 6 PM.   0  . Cholecalciferol (VITAMIN D-3) 25 MCG (1000 UT) CAPS Take 2,000-3,000 Units by mouth daily with breakfast.    . clopidogrel (PLAVIX) 75 MG tablet TAKE 1 TABLET(75 MG) BY MOUTH DAILY 90 tablet 0  . ibandronate (BONIVA) 150 MG tablet Take 150 mg by mouth every 30 (thirty) days.    . Multiple Vitamin (MULTIVITAMIN WITH MINERALS) TABS tablet Take 1 tablet by mouth daily.    Marland Kitchen olmesartan (BENICAR) 20 MG tablet Take 20 mg by mouth daily.    Marland Kitchen acetaminophen (TYLENOL) 500 MG tablet Take by mouth.    . ciprofloxacin (CILOXAN) 0.3 % ophthalmic solution Apply 4 drops in the right ear twice daily for 7 days.    . cyanocobalamin 1000 MCG tablet Take by mouth.    . Melatonin 10 MG TABS Take 10 mg by mouth at bedtime as needed (for sleep). typically cuts is half     No facility-administered medications prior to visit.    PAST MEDICAL HISTORY: Past Medical History:  Diagnosis Date  . 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 Ankle fracture, left   . Bladder cancer ([place]) 2011   3 times  . Diverticulosis    mild per colonoscopy  . Dyslipidemia   . Hemorrhoid    internal per colonoscopy 2016  . Hx of osteopenia   . NPH (normal pressure hydrocephalus) ([place]) 09/18/2016  . Stroke [facility])     PAST  SURGICAL HISTORY: Past Surgical History:  Procedure Laterality Date  . ABDOMINAL HYSTERECTOMY    . APPENDECTOMY    . BLADDER SURGERY     scope and removal of tumors  . BRAIN SURGERY    . 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 REFRACTIVE SURGERY    . TONSILLECTOMY      FAMILY HISTORY: Family History  Problem Relation Age of Onset  . Heart disease Father   . Stroke Sister   . Hypertension Sister   . Stroke Brother   . Hypertension Brother     SOCIAL HISTORY: Social History   Socioeconomic History  . Marital status: Single    Spouse name: Not on file  . Number of children: 3  . Years of education: 25  . Highest education level: Not  on file  Occupational History  . Occupation: retired  Tobacco Use  . Smoking status: Never Smoker  . Smokeless tobacco: Never Used  Substance and Sexual Activity  . Alcohol use: Yes    Comment: Occasional   . Drug use: No  . Sexual activity: Never  Other Topics Concern  . Not on file  Social History Narrative   Patient drinks 1 cup of coffee daily.   Patient is right handed.    Social Determinants of Health   Financial Resource Strain: Not on file  Food Insecurity: Not on file  Transportation Needs: Not on file  Physical Activity: Not on file  Stress: Not on file  Social Connections: Not on file  Intimate Partner Violence: Not on file   PHYSICAL EXAM  Vitals:   05/07/20 1407  BP: 136/66  Pulse: 73  Weight: 135 lb (61.2 kg)  Height: 5\' 3"  (1.6 m)   Body mass index is 23.91 kg/m.  Generalized: Well developed, in no acute distress, well-appearing  Neurological examination  Mentation: Alert oriented to time, place, history taking. Follows all commands speech and language fluent Cranial nerve II-XII: Pupils were equal round reactive to light. Extraocular movements were full, visual field were full on confrontational test. Facial sensation and strength were normal. Head turning and shoulder shrug  were normal and symmetric. Motor: The motor testing reveals 5 over 5 strength of all 4 extremities. Good symmetric motor tone is noted throughout.  Sensory: Sensory testing is intact to soft touch on all 4 extremities. No evidence of extinction is noted.  Coordination: Cerebellar testing reveals good finger-nose-finger and heel-to-shin bilaterally.  Gait and station: Gait is normal. Tandem gait is slightly unsteady. Romberg is negative. No drift is seen.  Reflexes: Deep tendon reflexes are symmetric and normal bilaterally.   DIAGNOSTIC DATA (LABS, IMAGING, TESTING) - I reviewed patient records, labs, notes, testing and imaging myself where available.  Lab Results  Component  Value Date   WBC 6.0 10/11/2019   HGB 15.3 (H) 10/11/2019   HCT 45.0 10/11/2019   MCV 92.0 10/11/2019   PLT 254 10/11/2019      Component Value Date/Time   NA 144 10/11/2019 1047   K 3.6 10/11/2019 1047   CL 110 10/11/2019 1047   CO2 23 10/11/2019 1035   GLUCOSE 105 (H) 10/11/2019 1047   BUN 9 10/11/2019 1047   CREATININE 0.80 10/11/2019 1047   CALCIUM 9.1 10/11/2019 1035   PROT 6.7 10/11/2019 1035   ALBUMIN 4.4 10/11/2019 1035   AST 23 10/11/2019 1035   ALT 21 10/11/2019 1035   ALKPHOS 53 10/11/2019 1035   BILITOT 2.1 (H)  10/11/2019 1035   GFRNONAA >60 10/11/2019 1035   GFRAA >60 10/11/2019 1035   Lab Results  Component Value Date   CHOL 141 09/03/2015   HDL 44 09/03/2015   LDLCALC 68 09/03/2015   TRIG 146 09/03/2015   CHOLHDL 3.2 09/03/2015   Lab Results  Component Value Date   HGBA1C 6.2 (H) 09/03/2015   No results found for: VITAMINB12 No results found for: TSH  ASSESSMENT AND PLAN 70 y.o. year old female  has a past medical history of Alteration of sensation as late effect of stroke (05/11/2015), Ankle fracture, left, Bladder cancer ([place]) (2011), Diverticulosis, Dyslipidemia, Hemorrhoid, osteopenia, NPH (normal pressure hydrocephalus) ([place]) (09/18/2016), and Stroke ([place]). here with:  1.  Normal pressure hydrocephalus 2.  Gait disorder  She continues to do well, and remains stable.  She follows with [facility] as needed for shunt maintenance, settings lowered back in June due to dizziness, did well.  She will follow-up in our office in 1 year or sooner if needed.  I spent 20 minutes of face-to-face and non-face-to-face time with patient.  This included previsit chart review, lab review, study review, order entry, electronic health record documentation, patient education.  Butler Denmark, AGNP-C, DNP 05/07/2020, 2:50 PM [facility] [address] [place], [place] [address] [phone]

## 2020-06-28 ENCOUNTER — Telehealth: Payer: Self-pay | Admitting: Neurology

## 2020-06-28 NOTE — Telephone Encounter (Signed)
I have only seeing Ms. Kylie Calhoun at 1 visit, at that time, there were no cognitive deficits that would exclude her from jury duty.  History of normal pressure hydrocephalus, status post VP shunt.  Her gait was steady, she was exercising.  She lives alone and drives a car. Unless I am missing something, I don't think she qualifies for jury duty exemption.

## 2020-06-28 NOTE — Telephone Encounter (Signed)
Pt called, I need a letter to excuse me from jury duty.

## 2020-06-28 NOTE — Telephone Encounter (Signed)
Called patient and informed her of NP's message. She stated she was concerned about parking in a parking deck and navigating her way down. I recommended she go on a day before jury report day and check out the parking  situation. Patient verbalized understanding, appreciation.

## 2020-07-12 ENCOUNTER — Other Ambulatory Visit: Payer: Self-pay | Admitting: Neurology

## 2020-07-12 DIAGNOSIS — I639 Cerebral infarction, unspecified: Secondary | ICD-10-CM

## 2021-01-01 ENCOUNTER — Other Ambulatory Visit: Payer: Self-pay | Admitting: Emergency Medicine

## 2021-01-01 DIAGNOSIS — I639 Cerebral infarction, unspecified: Secondary | ICD-10-CM

## 2021-01-01 MED ORDER — CLOPIDOGREL BISULFATE 75 MG PO TABS: ORAL_TABLET | ORAL | 1 refills | Status: DC

## 2021-05-06 NOTE — Progress Notes (Signed)
PATIENT: Kylie Calhoun DOB: 04-Dec-1950  REASON FOR VISIT: follow up for NPH HISTORY FROM: patient Primary Neurologist: Dr. Leta Baptist since Dr. Jannifer Franklin has retired  HISTORY OF PRESENT ILLNESS: Today 05/07/21 Kylie Calhoun is here today for follow-up with history of normal pressure hydrocephalus, status post VP shunt 2018. Hasn't been back to [facility] for shunt management, goes [facility], goes for adjustments, only can do 2 more adjustments if ever needed. Last was in summer 2021, settings were lowered due to dizziness. No falls, no dizziness currently. Lives alone, exercising, drives a car. Walks a lot for exercise. Still on Plavix since stroke in 2016. Passed her bone density, isn't in osteoporosis any more. Sees PCP regularly. Just came from exercise class today. At her worst before the shunt, she was dizzy, had to hold on to walls, terribly unbalanced.   Update 05/07/2020 SS: Kylie Calhoun is a 71 year old female history of normal pressure hydrocephalus, status post VP shunt 2018. She continues to do well, denies any falls, or trouble with the balance.  Does not often use a cane, has 1 if needed.  She remains engaged in exercise, has joined a [employer], walks outside as much as the weather will allow.  She lives alone, drives a car.  She is social, has learned to play chess.  She follows at [facility] for her shunt, was having some dizziness back in the summer, the shunt was reprogrammed to a lower setting 70 mmHg.  She remains on Plavix.  She went to the ER in June 2021, reporting numbness.  CT head showed no acute abnormalities. Problem resolved.  She indicates she is overall feeling quite well.  Here today for evaluation unaccompanied.  HISTORY 04/28/2019 Dr. Jannifer Franklin: Kylie Calhoun is a 71 year old right-handed white female with a history of normal pressure hydrocephalus, status post VP shunt done by Dr. Johnney Killian.  The patient has remained stable neurologically.  She is actively engaged in physical therapy, she does  exercises at home to work on balance.  She will be undergoing tympanoplasty on the right in the next several weeks by Dr. Redmond Baseman to repair perforations in her tympanic membrane.  The patient overall feels that she is doing well, she usually does not use a cane or a walking stick.  She will ambulate outside of the house on a regular basis for exercise.  She has not had any falls.   REVIEW OF SYSTEMS: Out of a complete 14 system review of symptoms, the patient complains only of the following symptoms, and all other reviewed systems are negative.  n/a  ALLERGIES: No Known Allergies  HOME MEDICATIONS: Outpatient Medications Prior to Visit  Medication Sig Dispense Refill   atorvastatin (LIPITOR) 20 MG tablet Take 20 mg by mouth daily at 6 PM.   0   Cholecalciferol (VITAMIN D-3) 25 MCG (1000 UT) CAPS Take 2,000-3,000 Units by mouth daily with breakfast.     clopidogrel (PLAVIX) 75 MG tablet TAKE 1 TABLET(75 MG) BY MOUTH DAILY 90 tablet 1   Multiple Vitamin (MULTIVITAMIN WITH MINERALS) TABS tablet Take 1 tablet by mouth daily.     olmesartan (BENICAR) 20 MG tablet Take 20 mg by mouth daily.     ibandronate (BONIVA) 150 MG tablet Take 150 mg by mouth every 30 (thirty) days.     No facility-administered medications prior to visit.    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 05/11/2015   Ankle fracture, left  Bladder cancer ([place]) 2011   3 times   Diverticulosis    mild per colonoscopy   Dyslipidemia    Hemorrhoid    internal per colonoscopy 2016   Hx of osteopenia    NPH (normal pressure hydrocephalus) ([place]) 09/18/2016   Stroke ([place])     PAST SURGICAL HISTORY: Past Surgical History:  Procedure Laterality Date   ABDOMINAL HYSTERECTOMY     APPENDECTOMY     BLADDER SURGERY     scope and removal of tumors   BRAIN SURGERY     OTHER SURGICAL HISTORY     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   REFRACTIVE SURGERY      TONSILLECTOMY      FAMILY HISTORY: Family History  Problem Relation Age of Onset   Heart disease Father    Stroke Sister    Hypertension Sister    Stroke Brother    Hypertension Brother     SOCIAL HISTORY: Social History   Socioeconomic History   Marital status: Single    Spouse name: Not on file   Number of children: 3   Years of education: 14   Highest education level: Not on file  Occupational History   Occupation: retired  Tobacco Use   Smoking status: Never   Smokeless tobacco: Never  Substance and Sexual Activity   Alcohol use: Yes    Comment: Occasional    Drug use: No   Sexual activity: Never  Other Topics Concern   Not on file  Social History Narrative   Patient drinks 1 cup of coffee daily.   Patient is right handed.    Social Determinants of Health   Financial Resource Strain: Not on file  Food Insecurity: Not on file  Transportation Needs: Not on file  Physical Activity: Not on file  Stress: Not on file  Social Connections: Not on file  Intimate Partner Violence: Not on file   PHYSICAL EXAM  Vitals:   05/07/21 1410  BP: (!) 143/81  Pulse: 74  Weight: 136 lb (61.7 kg)  Height: 5\' 3"  (1.6 m)    Body mass index is 24.09 kg/m.  Generalized: Well developed, in no acute distress, well-appearing  Neurological examination  Mentation: Alert oriented to time, place, history taking. Follows all commands speech and language fluent Cranial nerve II-XII: Pupils were equal round reactive to light. Extraocular movements were full, visual field were full on confrontational test. Facial sensation and strength were normal. Head turning and shoulder shrug  were normal and symmetric. Motor: The motor testing reveals 5 over 5 strength of all 4 extremities. Good symmetric motor tone is noted throughout.  Sensory: Sensory testing is intact to soft touch on all 4 extremities. No evidence of extinction is noted.  Coordination: Cerebellar testing reveals good  finger-nose-finger and heel-to-shin bilaterally.  Gait and station: Able to stand from seated position with arms crossed, gait is wide based, but is overall steady and stable, no assistive device, cautious Reflexes: Deep tendon reflexes are symmetric and normal bilaterally.   DIAGNOSTIC DATA (LABS, IMAGING, TESTING) - I reviewed patient records, labs, notes, testing and imaging myself where available.  Lab Results  Component Value Date   WBC 6.0 10/11/2019   HGB 15.3 (H) 10/11/2019   HCT 45.0 10/11/2019   MCV 92.0 10/11/2019   PLT 254 10/11/2019      Component Value Date/Time   NA 144 10/11/2019 1047   K 3.6 10/11/2019 1047   CL 110 10/11/2019 1047   CO2 23 10/11/2019  1035   GLUCOSE 105 (H) 10/11/2019 1047   BUN 9 10/11/2019 1047   CREATININE 0.80 10/11/2019 1047   CALCIUM 9.1 10/11/2019 1035   PROT 6.7 10/11/2019 1035   ALBUMIN 4.4 10/11/2019 1035   AST 23 10/11/2019 1035   ALT 21 10/11/2019 1035   ALKPHOS 53 10/11/2019 1035   BILITOT 2.1 (H) 10/11/2019 1035   GFRNONAA >60 10/11/2019 1035   GFRAA >60 10/11/2019 1035   Lab Results  Component Value Date   CHOL 141 09/03/2015   HDL 44 09/03/2015   LDLCALC 68 09/03/2015   TRIG 146 09/03/2015   CHOLHDL 3.2 09/03/2015   Lab Results  Component Value Date   HGBA1C 6.2 (H) 09/03/2015   No results found for: VITAMINB12 No results found for: TSH  ASSESSMENT AND PLAN 71 y.o. year old female  has a past medical history of Alteration of sensation as late effect of stroke (05/11/2015), Ankle fracture, left, Bladder cancer ([place]) (2011), Diverticulosis, Dyslipidemia, Hemorrhoid, osteopenia, NPH (normal pressure hydrocephalus) ([place]) (09/18/2016), and Stroke ([place]). here with:  1.  Normal pressure hydrocephalus post VP shunt 2018 2.  Gait disorder 3.  History of stroke  -Leanndra is doing great today -Follows with [facility] for shunt maintenance, last was in summer 2021 to have settings lowered due to dizziness -there is no dizziness  or gait instability, she walks regularly and partakes in exercise classes -Remains on Plavix post stroke in 2016 -Will return in 1 year to see Dr. Leta Baptist who will follow her since Dr. Jannifer Franklin has retired  Butler Denmark, [place], [place] 05/07/2021, 2:15 PM [facility] [address] [place], [place] [address] [phone]

## 2021-05-07 ENCOUNTER — Ambulatory Visit: Payer: Medicare Other | Admitting: Neurology

## 2021-05-07 VITALS — BP 143/81 | HR 74 | Ht 63.0 in | Wt 136.0 lb

## 2021-05-07 DIAGNOSIS — I63531 Cerebral infarction due to unspecified occlusion or stenosis of right posterior cerebral artery: Secondary | ICD-10-CM | POA: Diagnosis not present

## 2021-05-07 DIAGNOSIS — R269 Unspecified abnormalities of gait and mobility: Secondary | ICD-10-CM | POA: Diagnosis not present

## 2021-05-07 DIAGNOSIS — G912 (Idiopathic) normal pressure hydrocephalus: Secondary | ICD-10-CM

## 2021-05-07 NOTE — Patient Instructions (Signed)
Closely monitor symptoms, call for any changes See you back in 1 year

## 2021-05-08 ENCOUNTER — Telehealth: Payer: Self-pay | Admitting: Neurology

## 2021-05-08 NOTE — Telephone Encounter (Signed)
Cancelled appointment

## 2021-05-08 NOTE — Telephone Encounter (Signed)
Reviewed case with Dr. Leta Baptist, she will follow up at our office on as needed basis for new or worsening symptoms. I called the patient, she is okay with this.

## 2021-06-27 ENCOUNTER — Other Ambulatory Visit: Payer: Self-pay

## 2021-06-27 MED ORDER — CLOPIDOGREL BISULFATE 75 MG PO TABS: ORAL_TABLET | ORAL | 1 refills | Status: DC

## 2021-12-25 ENCOUNTER — Other Ambulatory Visit: Payer: Self-pay

## 2021-12-25 DIAGNOSIS — I639 Cerebral infarction, unspecified: Secondary | ICD-10-CM

## 2021-12-25 MED ORDER — CLOPIDOGREL BISULFATE 75 MG PO TABS: ORAL_TABLET | ORAL | 1 refills | Status: DC

## 2022-05-13 ENCOUNTER — Ambulatory Visit: Payer: Medicare Other | Admitting: Diagnostic Neuroimaging

## 2022-06-21 ENCOUNTER — Other Ambulatory Visit: Payer: Self-pay | Admitting: Neurology

## 2022-06-21 DIAGNOSIS — I639 Cerebral infarction, unspecified: Secondary | ICD-10-CM

## 2022-09-19 ENCOUNTER — Other Ambulatory Visit: Payer: Self-pay | Admitting: Neurology

## 2022-09-19 DIAGNOSIS — I639 Cerebral infarction, unspecified: Secondary | ICD-10-CM

## 2022-09-23 ENCOUNTER — Other Ambulatory Visit: Payer: Self-pay

## 2022-09-23 DIAGNOSIS — I639 Cerebral infarction, unspecified: Secondary | ICD-10-CM

## 2022-09-23 NOTE — Telephone Encounter (Signed)
For pcp to fill

## 2023-06-26 ENCOUNTER — Telehealth: Payer: Self-pay | Admitting: Neurology

## 2023-06-26 ENCOUNTER — Other Ambulatory Visit: Payer: Self-pay | Admitting: Anesthesiology

## 2023-06-26 DIAGNOSIS — I639 Cerebral infarction, unspecified: Secondary | ICD-10-CM

## 2023-06-26 MED ORDER — CLOPIDOGREL BISULFATE 75 MG PO TABS: ORAL_TABLET | ORAL | 1 refills | Status: AC

## 2023-06-26 NOTE — Telephone Encounter (Signed)
 Called pt regarding her message. Pt was informed that on her last ov with Margie Ege, she was advised to follow up within a year with Dr Marjory Lies, since Dr Anne Hahn had retired. Pt had forgotten about this. I advised pt that I was going to send her a refill on Plavix to hold her off until she comes for her follow up appointment. Pt thanked me for calling and informing her that she did in fact needed to be seen for a follow up.

## 2023-06-26 NOTE — Telephone Encounter (Signed)
 The pt is needing a refill on her clopidogrel (PLAVIX) 75 MG tablet and is needing it sent to the [facility] on [address]. Pt has not been seen in over a year. I advised her that she would need a yearly appt to be able to contiguously get her refills and she stated that she was informed that she does not need to come back. Please advise.

## 2023-08-18 ENCOUNTER — Ambulatory Visit: Admitting: Diagnostic Neuroimaging

## 2023-08-18 ENCOUNTER — Encounter: Payer: Self-pay | Admitting: Diagnostic Neuroimaging

## 2023-08-18 VITALS — BP 136/80 | HR 63 | Ht 63.0 in | Wt 135.0 lb

## 2023-08-18 DIAGNOSIS — G912 (Idiopathic) normal pressure hydrocephalus: Secondary | ICD-10-CM

## 2023-08-18 DIAGNOSIS — I63531 Cerebral infarction due to unspecified occlusion or stenosis of right posterior cerebral artery: Secondary | ICD-10-CM | POA: Diagnosis not present

## 2023-08-18 NOTE — Progress Notes (Signed)
 [facility]  PATIENT: Kylie Calhoun DOB: [birth year]  REFERRING CLINICIAN: Jeannine Calhoun., MD HISTORY FROM: patient  REASON FOR VISIT: follow up   HISTORICAL  CHIEF COMPLAINT:  Chief Complaint  Patient presents with   Follow-up    Pt in 7 alone Pt here for stroke/ f/u Pt states no questions or concerns for todays visit     HISTORY OF PRESENT ILLNESS:   UPDATE (08/18/23, VRP): Since last visit, doing well. Symptoms are stable. No alleviating or aggravating factors. Tolerating meds. Reviewed prior diagnoses and treatments. Previously seen by Dr. Tilda Calhoun and Jeanmarie Millet, NP.   PRIOR HPI (05/07/21, SS):  "Kylie Calhoun is here today for follow-up with history of normal pressure hydrocephalus, status post VP shunt 2018. Hasn't been back to [facility] for shunt management, goes PRN, goes for adjustments, only can do 2 more adjustments if ever needed. Last was in summer 2021, settings were lowered due to dizziness. No falls, no dizziness currently. Lives alone, exercising, drives a car. Walks a lot for exercise. Still on Plavix  since stroke in 2016. Passed her bone density, isn't in osteoporosis any more. Sees PCP regularly. Just came from exercise class today. At her worst before the shunt, she was dizzy, had to hold on to walls, terribly unbalanced."  REVIEW OF SYSTEMS: Full 14 system review of systems performed and negative with exception of: as per HPI.  ALLERGIES: No Known Allergies  HOME MEDICATIONS: Outpatient Medications Prior to Visit  Medication Sig Dispense Refill   atorvastatin  (LIPITOR) 20 MG tablet Take 20 mg by mouth daily at 6 PM.   0   Cholecalciferol (VITAMIN D-3) 25 MCG (1000 UT) CAPS Take 2,000-3,000 Units by mouth daily with breakfast.     clopidogrel  (PLAVIX ) 75 MG tablet TAKE 1 TABLET(75 MG) BY MOUTH DAILY 90 tablet 1   Multiple Vitamin (MULTIVITAMIN WITH MINERALS) TABS tablet Take 1 tablet by mouth daily.     olmesartan (BENICAR) 20 MG tablet Take 20  mg by mouth daily.     No facility-administered medications prior to visit.      PHYSICAL EXAM  GENERAL EXAM/CONSTITUTIONAL: Vitals:  Vitals:   08/18/23 1537  BP: 136/80  Pulse: 63  Weight: 135 lb (61.2 kg)  Height: 5\' 3"  (1.6 m)   Body mass index is 23.91 kg/m. Wt Readings from Last 3 Encounters:  08/18/23 135 lb (61.2 kg)  05/07/21 136 lb (61.7 kg)  05/07/20 135 lb (61.2 kg)   Patient is in no distress; well developed, nourished and groomed; neck is supple  CARDIOVASCULAR: Examination of carotid arteries is normal; no carotid bruits Regular rate and rhythm, no murmurs Examination of peripheral vascular system by observation and palpation is normal  EYES: Ophthalmoscopic exam of optic discs and posterior segments is normal; no papilledema or hemorrhages No results found.  MUSCULOSKELETAL: Gait, strength, tone, movements noted in Neurologic exam below  NEUROLOGIC: MENTAL STATUS:      No data to display         awake, alert, oriented to person, place and time recent and remote memory intact normal attention and concentration language fluent, comprehension intact, naming intact fund of knowledge appropriate  CRANIAL NERVE:  2nd - no papilledema on fundoscopic exam 2nd, 3rd, 4th, 6th - pupils equal and reactive to light, visual fields full to confrontation, extraocular muscles intact, no nystagmus 5th - facial sensation symmetric 7th - facial strength symmetric 8th - hearing intact 9th - palate elevates symmetrically, uvula midline 11th - shoulder shrug  symmetric 12th - tongue protrusion midline MILD POSTURAL HEAD TREMOR  MOTOR:  normal bulk and tone, full strength in the BUE, BLE  SENSORY:  normal and symmetric to light touch, temperature, vibration  COORDINATION:  finger-nose-finger, fine finger movements normal  REFLEXES:  deep tendon reflexes 1+ and symmetric  GAIT/STATION:  WIDE BASED GAIT; SHORT STEPS; SLIGHTLY SPASTIC  GAIT     DIAGNOSTIC DATA (LABS, IMAGING, TESTING) - I reviewed patient records, labs, notes, testing and imaging myself where available.  Lab Results  Component Value Date   WBC 6.0 10/11/2019   HGB 15.3 (H) 10/11/2019   HCT 45.0 10/11/2019   MCV 92.0 10/11/2019   PLT 254 10/11/2019      Component Value Date/Time   NA 144 10/11/2019 1047   K 3.6 10/11/2019 1047   CL 110 10/11/2019 1047   CO2 23 10/11/2019 1035   GLUCOSE 105 (H) 10/11/2019 1047   BUN 9 10/11/2019 1047   CREATININE 0.80 10/11/2019 1047   CALCIUM  9.1 10/11/2019 1035   PROT 6.7 10/11/2019 1035   ALBUMIN 4.4 10/11/2019 1035   AST 23 10/11/2019 1035   ALT 21 10/11/2019 1035   ALKPHOS 53 10/11/2019 1035   BILITOT 2.1 (H) 10/11/2019 1035   GFRNONAA >60 10/11/2019 1035   GFRAA >60 10/11/2019 1035   Lab Results  Component Value Date   CHOL 141 09/03/2015   HDL 44 09/03/2015   LDLCALC 68 09/03/2015   TRIG 146 09/03/2015   CHOLHDL 3.2 09/03/2015   Lab Results  Component Value Date   HGBA1C 6.2 (H) 09/03/2015   No results found for: "VITAMINB12" No results found for: "TSH"   05/10/16 MRI of the brain without contrast shows the following: [I reviewed images myself and agree with interpretation. -VRP]  1.    Multiple T2/FLAIR hyperintense foci in the cerebral hemispheres, pons, left thalamus and left cerebellum consistent with moderate chronic microvascular ischemic change. Compared to the 09/02/2015 MRI, there does not appear to be any new lesions. The small acute stroke noted on the prior MRI appears chronic on the current study. 2.    Moderate cortical atrophy. 3.    There are no acute findings.   ASSESSMENT AND PLAN  73 y.o. year old female here with:   Dx:  1. Cerebrovascular accident (CVA) due to stenosis of right posterior cerebral artery (HCC)   2. NPH (normal pressure hydrocephalus) (HCC)     PLAN:  STROKE PREVENTION - continue clopidogrel  75mg  daily (will ask PCP to refill going  forward) - continue atorvastatin , olmesartan  NPH (s/p VP shunt in 2018) - doing well; symptoms stable; last seen at [facility] in 2021  Return for return to PCP, pending if symptoms worsen or fail to improve.    Kylie Bible, MD 08/18/2023, 4:09 PM Certified in Neurology, Neurophysiology and Neuroimaging  [facility] [address], [address] [place], [place] [address] [phone]

## 2023-08-18 NOTE — Patient Instructions (Signed)
 STROKE PREVENTION - continue clopidogrel  75mg  daily (will ask PCP Dr. Bernetta Brilliant to refill going forward) - continue atorvastatin , olmesartan  NPH (s/p VP shunt in 2018) - doing well; symptoms stable; last seen at [facility] in 2021

## 2023-09-21 ENCOUNTER — Other Ambulatory Visit: Payer: Self-pay | Admitting: Neurology

## 2023-09-21 DIAGNOSIS — I639 Cerebral infarction, unspecified: Secondary | ICD-10-CM

## 2023-09-24 ENCOUNTER — Other Ambulatory Visit: Payer: Self-pay

## 2023-09-24 DIAGNOSIS — I639 Cerebral infarction, unspecified: Secondary | ICD-10-CM

## 2023-09-24 NOTE — Telephone Encounter (Signed)
 Requested Prescriptions   Pending Prescriptions Disp Refills   clopidogrel  (PLAVIX ) 75 MG tablet 90 tablet 1    Sig: TAKE 1 TABLET(75 MG) BY MOUTH DAILY   Last note stated: STROKE PREVENTION - continue clopidogrel  75mg  daily (will ask PCP to refill going forward)
